# Patient Record
Sex: Male | Born: 1955 | Race: White | Hispanic: No | Marital: Married | Smoking: Former smoker
Health system: Southern US, Community
[De-identification: ages and names within clinical notes are randomized; demographics above are authoritative.]

## PROBLEM LIST (undated history)

## (undated) DIAGNOSIS — N189 Chronic kidney disease, unspecified: Secondary | ICD-10-CM

## (undated) DIAGNOSIS — M1990 Unspecified osteoarthritis, unspecified site: Secondary | ICD-10-CM

## (undated) DIAGNOSIS — E039 Hypothyroidism, unspecified: Secondary | ICD-10-CM

## (undated) DIAGNOSIS — I201 Angina pectoris with documented spasm: Secondary | ICD-10-CM

## (undated) DIAGNOSIS — I10 Essential (primary) hypertension: Secondary | ICD-10-CM

## (undated) DIAGNOSIS — K219 Gastro-esophageal reflux disease without esophagitis: Secondary | ICD-10-CM

## (undated) DIAGNOSIS — I639 Cerebral infarction, unspecified: Secondary | ICD-10-CM

## (undated) DIAGNOSIS — Z720 Tobacco use: Secondary | ICD-10-CM

## (undated) DIAGNOSIS — J189 Pneumonia, unspecified organism: Secondary | ICD-10-CM

## (undated) DIAGNOSIS — I2510 Atherosclerotic heart disease of native coronary artery without angina pectoris: Secondary | ICD-10-CM

## (undated) DIAGNOSIS — E785 Hyperlipidemia, unspecified: Secondary | ICD-10-CM

## (undated) HISTORY — DX: Atherosclerotic heart disease of native coronary artery without angina pectoris: I25.10

## (undated) HISTORY — DX: Unspecified osteoarthritis, unspecified site: M19.90

## (undated) HISTORY — PX: CARDIAC CATHETERIZATION: SHX172

## (undated) HISTORY — PX: BACK SURGERY: SHX140

## (undated) HISTORY — DX: Cerebral infarction, unspecified: I63.9

---

## 1998-11-09 ENCOUNTER — Emergency Department (HOSPITAL_COMMUNITY): Admission: EM | Admit: 1998-11-09 | Discharge: 1998-11-10 | Payer: Self-pay | Admitting: Emergency Medicine

## 1998-11-09 ENCOUNTER — Encounter: Payer: Self-pay | Admitting: Emergency Medicine

## 2001-05-27 ENCOUNTER — Encounter: Payer: Self-pay | Admitting: Emergency Medicine

## 2001-05-27 ENCOUNTER — Emergency Department (HOSPITAL_COMMUNITY): Admission: EM | Admit: 2001-05-27 | Discharge: 2001-05-27 | Payer: Self-pay | Admitting: Emergency Medicine

## 2002-11-24 ENCOUNTER — Encounter: Payer: Self-pay | Admitting: Orthopedic Surgery

## 2002-11-24 ENCOUNTER — Encounter (INDEPENDENT_AMBULATORY_CARE_PROVIDER_SITE_OTHER): Payer: Self-pay | Admitting: *Deleted

## 2002-11-24 ENCOUNTER — Ambulatory Visit (HOSPITAL_COMMUNITY): Admission: RE | Admit: 2002-11-24 | Discharge: 2002-11-24 | Payer: Self-pay | Admitting: Orthopedic Surgery

## 2007-06-11 ENCOUNTER — Inpatient Hospital Stay (HOSPITAL_COMMUNITY): Admission: EM | Admit: 2007-06-11 | Discharge: 2007-06-14 | Payer: Self-pay | Admitting: Emergency Medicine

## 2007-07-19 ENCOUNTER — Observation Stay (HOSPITAL_COMMUNITY): Admission: EM | Admit: 2007-07-19 | Discharge: 2007-07-20 | Payer: Self-pay | Admitting: Emergency Medicine

## 2007-07-27 DIAGNOSIS — I10 Essential (primary) hypertension: Secondary | ICD-10-CM | POA: Insufficient documentation

## 2009-10-08 ENCOUNTER — Inpatient Hospital Stay (HOSPITAL_COMMUNITY): Admission: EM | Admit: 2009-10-08 | Discharge: 2009-10-09 | Payer: Self-pay | Admitting: Emergency Medicine

## 2009-12-12 ENCOUNTER — Ambulatory Visit (HOSPITAL_COMMUNITY): Admission: RE | Admit: 2009-12-12 | Discharge: 2009-12-12 | Payer: Self-pay | Admitting: Family Medicine

## 2010-06-24 ENCOUNTER — Ambulatory Visit (HOSPITAL_COMMUNITY): Admission: RE | Admit: 2010-06-24 | Discharge: 2010-06-24 | Payer: Self-pay | Admitting: General Surgery

## 2010-12-07 LAB — POCT I-STAT, CHEM 8
BUN: 13 mg/dL (ref 6–23)
Calcium, Ion: 1.15 mmol/L (ref 1.12–1.32)
Chloride: 104 meq/L (ref 96–112)
Creatinine, Ser: 0.8 mg/dL (ref 0.4–1.5)
Glucose, Bld: 118 mg/dL — ABNORMAL HIGH (ref 70–99)
HCT: 47 % (ref 39.0–52.0)
Hemoglobin: 16 g/dL (ref 13.0–17.0)
Potassium: 4.1 meq/L (ref 3.5–5.1)
Sodium: 136 meq/L (ref 135–145)
TCO2: 24 mmol/L (ref 0–100)

## 2010-12-07 LAB — CBC
HCT: 41 % (ref 39.0–52.0)
HCT: 44 % (ref 39.0–52.0)
Hemoglobin: 15.3 g/dL (ref 13.0–17.0)
MCHC: 34.9 g/dL (ref 30.0–36.0)
MCV: 89.7 fL (ref 78.0–100.0)
Platelets: 195 K/uL (ref 150–400)
RBC: 4.9 MIL/uL (ref 4.22–5.81)
RDW: 12.9 % (ref 11.5–15.5)
RDW: 13 % (ref 11.5–15.5)
WBC: 7.5 K/uL (ref 4.0–10.5)

## 2010-12-07 LAB — BASIC METABOLIC PANEL WITH GFR
BUN: 11 mg/dL (ref 6–23)
CO2: 28 meq/L (ref 19–32)
Calcium: 9 mg/dL (ref 8.4–10.5)
Chloride: 105 meq/L (ref 96–112)
Creatinine, Ser: 0.92 mg/dL (ref 0.4–1.5)
GFR calc non Af Amer: >60 mL/min
Glucose, Bld: 95 mg/dL (ref 70–99)
Potassium: 4.6 meq/L (ref 3.5–5.1)
Sodium: 137 meq/L (ref 135–145)

## 2010-12-07 LAB — COMPREHENSIVE METABOLIC PANEL
ALT: 47 U/L (ref 0–53)
CO2: 24 mEq/L (ref 19–32)
Chloride: 102 mEq/L (ref 96–112)
GFR calc Af Amer: >60 mL/min (ref >60)
Potassium: 4.5 mEq/L (ref 3.5–5.1)
Sodium: 133 mEq/L — ABNORMAL LOW (ref 135–145)
Total Bilirubin: 0.9 mg/dL (ref 0.3–1.2)

## 2010-12-07 LAB — TSH: TSH: 6.246 u[IU]/mL — ABNORMAL HIGH (ref 0.350–4.500)

## 2010-12-07 LAB — BRAIN NATRIURETIC PEPTIDE: Pro B Natriuretic peptide (BNP): <30 pg/mL (ref 0.0–100.0)

## 2010-12-07 LAB — CARDIAC PANEL(CRET KIN+CKTOT+MB+TROPI)
CK, MB: 1.7 ng/mL (ref 0.3–4.0)
Troponin I: 0.01 ng/mL (ref 0.00–0.06)

## 2010-12-07 LAB — CK TOTAL AND CKMB (NOT AT ARMC)
CK, MB: 2.1 ng/mL (ref 0.3–4.0)
Relative Index: 1.1 (ref 0.0–2.5)

## 2010-12-07 LAB — TROPONIN I

## 2010-12-07 LAB — MAGNESIUM: Magnesium: 2.4 mg/dL (ref 1.5–2.5)

## 2010-12-07 LAB — PROTIME-INR
INR: 0.92 (ref 0.00–1.49)
Prothrombin Time: 12.3 seconds (ref 11.6–15.2)

## 2011-02-03 NOTE — Discharge Summary (Signed)
Carl, [name]                 ACCOUNT NO.:  [id number]   MEDICAL RECORD NO.:  [id number]          PATIENT TYPE:  OBV   LOCATION:  6523                         FACILITY:  [facility]   PHYSICIAN:  Francisca December, M.D.  DATE OF BIRTH:  1955/12/27   DATE OF ADMISSION:  07/19/2007  DATE OF DISCHARGE:  07/20/2007                               DISCHARGE SUMMARY   DISCHARGE DIAGNOSES:  1. Chest pain, felt to be noncardiac in nature.  2. Hypothyroidism.  3. 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  4. History of polysubstance use.  5. Family history of early coronary artery disease.   HOSPITAL COURSE:  [name] is a 55 year old male patient with a one day  history of chest pain, which he describes as being midsternal and  reproducible with palpation.  He denies any shortness of breath,  palpitations.  He states that the pain is not worse with deep breathing.  Review of systems otherwise negative.   PAST MEDICAL HISTORY:  Significant for cardiac catheterization in  September 2008, it showed nonobstructive coronary artery disease, but  did demonstrate coronary spasm of the right coronary artery.  He was  then placed on anti-anginal/vasodilatory drugs (Norvasc, nitroglycerin  patch). The patient has not being taking the patch and this is unclear  to [place] as why he is not taking it.   The patient remained in the hospital overnight and ruled out for  myocardial infarction by negative cardiac isoenzymes.  His TSH was  8.213.  Chest x-ray revealed cardiomegaly versus pulmonary venous  congestion and on one view in the emergency room there is a question of  airspace disease therefore; he was treated for pneumonia, although this  is not completely clear that he has definite fluid pneumonia.   He did undergo a stress Cardiolite and this was normal with no evidence  of infarction or ischemia, normal EF.   He was discharged unstable, but  improved condition on the following  medications.   DISCHARGE MEDICATIONS:  1. Synthroid, this medication was increased to 50 mcg daily and he was      asked to see Dr. Collins Scotland in 6 weeks for recheck of his thyroid      function.  2. He is to start Norvasc 5 mg a day.  3. Nitro-Dur patch 0.4 mg applied in the morning, remove at bedtime.  4. Nexium 40 mg daily.  5. Flexeril 10 mg t.i.d. p.r.n.  6. Vicodin p.r.n.  7. Nitroglycerin p.r.n.  8. Baby aspirin 81 mg daily.   ACTIVITY:  As tolerated.   FOLLOWUP:  Followup with Dr. Deitra Mayo, Nurse Practitioner on  August 04, 2007, at 9:15 a.m.  At this point, we need to make sure  this patient has a followup with Dr. Dewain Penning in order to have his  thyroid reevaluated.   Please note laboratory data during his hospitalization do show negative  cardiac isoenzymes.  Sodium 133, potassium 4.4, BUN 18, creatinine 0.8,  hemoglobin 15.5, hematocrit 44.3.   I did  place him on Zithromax 250 mg one p.o. daily for 3 additional days  because of a question of pneumonia on chest x-ray.      Guy Franco, P.A.      Francisca December, M.D.  Electronically Signed    LB/MEDQ  D:  07/20/2007  T:  07/21/2007  Job:  [id number]

## 2011-02-03 NOTE — Cardiovascular Report (Signed)
NAMENICHOLAD, KAUTZMAN                 ACCOUNT NO.:  [id number]   MEDICAL RECORD NO.:  [id number]          PATIENT TYPE:  INP   LOCATION:  4706                         FACILITY:  [facility]   PHYSICIAN:  Francisca December, M.D.  DATE OF BIRTH:  05-18-1956   DATE OF PROCEDURE:  06/13/2007  DATE OF DISCHARGE:                            CARDIAC CATHETERIZATION   PROCEDURES PERFORMED:  1. Left heart catheterization.  2. Left ventriculogram.  3. Coronary angiography.   INDICATIONS:  Carl [name] is a 55 year old man with multiple risk  factors for coronary heart disease including heavy smoking history.  Been admitted with persistent anterior substernal chest pain unrelieved  with topical nitrates.  Some relief with IV morphine.  Because of the  persistence of his chest discomfort and he has multiple risk factors, he  was brought to the catheterization laboratory at this time to identify  the extent of disease and provide for further therapeutic options.   PROCEDURE NOTE:  The patient is brought to cardiac catheterization  laboratory in the fasting state.  The right groin was prepped and draped  in the usual sterile fashion.  Local anesthesia was obtained with  infiltration of 1% lidocaine.  A 6-French catheter sheath was inserted  percutaneously into the right femoral artery utilizing an anterior  approach over guiding J-wire.  Left heart catheterization and coronary  angiography then proceeded in standard fashion using 6-French #4 left  and right Judkins catheters and a 110-cm pigtail catheter.  A 30-degree  RAO cine left ventriculogram was performed utilizing a power injector  and 45 mL were injected at 13 mL per second.  At completion of the  procedure, a right femoral arteriogram in the 45-degree RAO angulation  via the catheter sheath by hand injection documented adequate anatomy  for placement percutaneous closure device Angio-Seal.  This assessment  deployed with good hemostasis and intact  distal pulse.  The patient is  transported to recovery area in stable condition.  It should be noted  the patient did receive 0.2 mg nitroglycerin into the right coronary  artery following initial right coronary angiography.   HEMODYNAMIC RESULTS:  Systemic arterial pressure was 137/83 with a mean  of 105 mmHg.  There was no systolic gradient across the aortic valve.  The left ventricular end-diastolic pressure was 16 mmHg pre  ventriculogram.   ANGIOGRAPHY:  The left ventriculogram demonstrated normal chamber size  and normal systolic function without regional wall motion abnormality.  Visual estimate of the ejection fraction is 65-70%.  There is no mitral  regurgitation.  There is a minimal degree of right coronary  calcification.   There is a right-dominant coronary system present.  The main left  coronary was short and normal.   The left anterior descending artery and its branches were minimally  diseased; there is a 30%  proximal eccentric stenosis and then luminal  irregularity throughout the remainder of the vessel.  A single large  diagonal arises.  The second diagonal is quite small.  The vessel  reaches and traverses the apex.  There are no obstructions and diagonal  branches.   The left circumflex coronary artery and its branches are without  obstruction.  A large first marginal branch arises and then a large-to-  moderate sized second marginal branch is present.  I can see no  obstructions in the trunk of the left circumflex or in the marginal  branches themselves   The right coronary artery appeared to be a diffusely narrowed,  especially in the posterior descending and posterolateral branches upon  initial angiography.  As well, there was any eccentric 70-75% stenosis  in the proximal to mid segment.  Following intracoronary nitroglycerin  0.2 mg, the proximal stenosis resolved predominantly and the distal  vessels appeared in general much larger.  There is perhaps  a 20%  narrowing left at the previous site of 70% narrowing.  There is a 25%  tubular narrowing in the mid portion and then luminal irregularities are  present in the distal segment.  The distal segment bifurcates into the  poster descending artery and posterolateral segment with a single left  ventricular branch.  There are no obstructions, but there are luminal  irregularities in these distal branches.   Collateral vessels are not seen.   FINAL IMPRESSION:  1. Nonobstructive atherosclerotic cardiovascular disease.  2. Coronary vasospasm involving the right coronary.  3. Intact left ventricular dysfunction size and global systolic      function, ejection fraction 65%.  It should be noted the patient      arrived the catheterization laboratory with mild anterior      substernal chest pain.  At the completion of procedure and after      the intracoronary nitroglycerin, he had no residual discomfort.  It      is certainly possible that coronary vasospasm was playing a role in      his chest discomfort although a 70% stenosis should no be enough to      cause rest pain.  There was, however, distal diffuse spasm.  I      would, therefore, recommend initiation of topical nitroglycerin 0.4      mg in the form of a patch replaced daily at 0800 and remove at      bedtime.  4. Also begin amlodipine 5 mg p.o. daily  5. Baby aspirin daily.  6. Smoking cessation strongly recommended.      Francisca December, M.D.  Electronically Signed     JHE/MEDQ  D:  06/13/2007  T:  06/14/2007  Job:  [id number]   cc:   Tammy R. Collins Scotland, M.D.

## 2011-02-03 NOTE — Discharge Summary (Signed)
Carl [name], Carl [name]                 ACCOUNT NO.:  [id number]   MEDICAL RECORD NO.:  [id number]          PATIENT TYPE:  INP   LOCATION:  4706                         FACILITY:  [facility]   PHYSICIAN:  Ladell Pier, M.D.   DATE OF BIRTH:  May 30, 1956   DATE OF ADMISSION:  06/10/2007  DATE OF DISCHARGE:  06/14/2007                               DISCHARGE SUMMARY   DISCHARGE DIAGNOSES:  1. Chest pain with negative cardiac catheterization, 30% proximal left      anterior descending, circumflex okay, right coronary artery 75%      mild, IC nitroglycerin resolved.  The patient's chest pain      questionable secondary to coronary vasospasm.  The patient      discharged on amlodipine 5 mg daily.  2. Smoking cessation for tobacco abuse.  3. Neck pain/shoulder pain with no cord compression via the MRI.  4. Alcohol use.  5. Subclinical hypothyroidism.  6. Dyslipidemia.   DISCHARGE MEDICATIONS:  1. Norvasc 5 mg daily.  2. Synthroid 25 mcg daily.   FOLLOW UP APPOINTMENTS:  The patient to follow up with Dr. Herb Grays.   CONSULTATIONS:  Cardiology, [place].   PROCEDURE:  Cardiac catheterization done on June 13, 2007.   HISTORY OF PRESENT ILLNESS:  The patient is a 55 year old male that for  the past 3 months he has been having left-sided chest pain on and off,  described as being sharp.  It would last 4 to 5 seconds since June 07, 2007.  However, the pain started occurring more frequently,  sometimes lasting about 10 minutes, and associated with shortness of  breath and diaphoresis.  He has experienced 6 to 7 episodes of this on a  daily basis.   Past medical history, family history, social history, meds, allergies,  review of systems per admission [facility].   PHYSICAL EXAMINATION ON DISCHARGE:  VITAL SIGNS:  Temperature 98.5,  pulse 64, respirations 20, blood pressure 123/85, pulse ox 95% on room  air.  HEENT:  Head is normocephalic, atraumatic.  Pupils equally round and  reactive to light.  Throat regular.  No edema.  CARDIOVASCULAR:  Regular rate and rhythm.  LUNGS:  Clear bilaterally.  ABDOMEN:  Positive bowel sounds.  EXTREMITIES:  No edema.   HOSPITAL COURSE:  1. Chest pain:  The patient was admitted to the hospital and      cardiology was consulted.  He had cardiac enzymes that were normal.      He had a CT angiogram of the chest that showed no pulmonary      embolism.  He had cardiac catheterization that did not show any      significant blockage.  His chest pain is probably secondary to      vasospasm.  He was started on amlodipine, to be discharged with      that.  He had nitro paste while he was inpatient.  2. Subclinical hypothyroidism:  The patient had TSH done that was      elevated to 8, but his free T4 was normal.  He most likely had  subclinical hypothyroidism.  Will start him on Synthroid 25 mcg      daily.  He will follow up with his primary care physician.  3. Neck pain/shoulder pain:  He had a MRI of the neck that did show      some disk bulging, but no cord compression.  Told him to use ice      pack and neck stretches.  4. Smoking, tobacco use:  Encouraged smoking cessation.  5. Dyslipidemia:  The patient had elevated cholesterol in the      hospital.  Will defer to cardiology and primary care physician.      Start on Statin or a period of trial diet and exercise.   DISCHARGE LABS:  Sodium 133, potassium 4, chloride 102, CO2 26, BUN 17,  creatinine 0.83, glucose 80.  WBC 8, hemoglobin 13.6, platelets 191,000.  PTT 38, PT 12.8, INR 0.9.  Free T4 1.08, TSH 8.959.  Total cholesterol  of 268, triglycerides 361, HDL 28, LDL 168.  CK 217, MB 4, relative  index 1.8.  MRI of the C-spine overall mild degenerative changes, mild  neural foraminal stenosis at C5, C6, and C7 levels due to uncovertebral  spurring.      Ladell Pier, M.D.  Electronically Signed     NJ/MEDQ  D:  06/14/2007  T:  06/14/2007  Job:  [id number]   cc:    Tammy R. Collins [place], M.D.

## 2011-02-03 NOTE — H&P (Signed)
NAMEMONTREL, DONAHOE                 ACCOUNT NO.:  [id number]   MEDICAL RECORD NO.:  [id number]          PATIENT TYPE:  OBV   LOCATION:  6523                         FACILITY:  [facility]   PHYSICIAN:  Francisca December, M.D.  DATE OF BIRTH:  1956-08-18   DATE OF ADMISSION:  07/19/2007  DATE OF DISCHARGE:                              HISTORY & PHYSICAL   CHIEF COMPLAINT:  Chest pain.   HISTORY OF PRESENT ILLNESS:  Mr. Peggs is a 55 year old male with a one  day history of chest pain that he describes as just left to his mid  sternal region.  He has reproducible pain with palpation of this area.  The pain is not worse to deep breathing.  He has no shortness of breath  or palpitations, no other symptoms.  Review of systems is otherwise  negative.   PAST MEDICAL HISTORY:  Significant for cardiac catheterization in  September 2008, which showed nonobstructive disease, but did involve  vasospasm of the right coronary artery.  At that point, he was placed on  antianginal/vasodilatory drugs (Norvasc and nitroglycerin patch).  Somehow, the patient is not utilizing the nitroglycerin patch and it is  unclear why.   FAMILY HISTORY:  Coronary artery disease, alcohol use, hypothyroidism.  Mom with a history of coronary artery disease, beginning in her 58s.  Dad beginning of coronary artery disease in his 68s.   SOCIAL HISTORY:  He smokes less than a pack a day.  He drinks on  weekends, approximately 5 beers.  He denies illicit drug use.   ALLERGIES:  No known drug allergies.   MEDICATIONS:  1. Nexium 40 mg a day.  2. Flexeril 10 mg t.i.d. p.r.n.  3. Vicodin p.r.n.  4. Sublingual nitroglycerin p.r.n.  5. Synthroid 25 mcg daily.   PHYSICAL EXAMINATION:  VITAL SIGNS:  Temperature 96, blood pressure  112/75, pulse 57, respirations 12, O2 saturation 99% on room air.  HEENT:  Grossly normal.  Sclera clear.  Conjunctiva normal.  Nares  without drainage.  NECK:  No carotid or subclavian bruits.  No  JVD or thyromegaly.  CHEST:  Clear to auscultation bilaterally.  No wheezing.  No rhonchi.  HEART:  Regular rate and rhythm.  No rubs or murmurs.  ABDOMEN:  Benign.  Good bowel sounds, nontender, nondistended.  No  masses.  No bruits.  LOWER EXTREMITIES:  No peripheral edema.  Skin warm and dry.  Palpable  PT pulses bilaterally.   LABORATORY STUDIES:  EKG shows sinus bradycardia, rate 56, with no acute  changes.   LABORATORY DATA:  Studies show point-of-care markers negative x2.  BUN  18, creatinine 0.8.  CBC normal.   ASSESSMENT/PLAN:  1. Chest pain.  2. Nonobstructive coronary artery disease.  3. History of coronary vasospasm of the right coronary artery.  4. Polysubstance use.  5. Early family history of coronary artery disease.  6. Hypothyroidism.   The patient will be admitted and we will follow cardiac enzymes.  I will  go ahead and place him on Lovenox, as well as IV nitroglycerin.  In  addition, check a  TSH.  He was recently started on thyroid medication  approximately 6 weeks ago.  We plan to perform a stress Cardiolite  tomorrow morning unless enzymes are positive, then we need to go ahead  and proceed with heart catheterization.      Guy Franco, P.A.      Francisca December, M.D.  Electronically Signed    LB/MEDQ  D:  07/19/2007  T:  07/19/2007  Job:  [id number]   cc:   Tammy R. Collins Scotland, M.D.  Francisca December, M.D.

## 2011-02-03 NOTE — Consult Note (Signed)
Carl [name], Carl [name]                 ACCOUNT NO.:  [id number]   MEDICAL RECORD NO.:  [id number]          PATIENT TYPE:  INP   LOCATION:  4706                         FACILITY:  [facility]   PHYSICIAN:  Georga Hacking, M.D.DATE OF BIRTH:  Mar 06, 1956   DATE OF CONSULTATION:  06/11/2007  DATE OF DISCHARGE:                                 CONSULTATION   REASON FOR CONSULTATION:  Chest discomfort.   This 55 year old male requests Dr. Corliss Marcus for his cardiologist,  since Dr. Amil Amen took care of his wife several years ago, and I was  asked to see the patient since I am covering for Dr. Amil Amen this  weekend.   Patient is a 55 year old male who has previously been in reasonable  health but has had significant back pain and is taking an unknown  medication that was given to him by Dr. Collins Scotland.  He was brought to the  emergency room with significant chest discomfort, which is described as  left-sided.  He is somewhat vague in his description of it, but says it  has been present more or less since Tuesday and would come and go.  He  described initially numbness and pain involving his left arm and up into  his neck and also complains of anterior right chest pain as if it is a  burning-type pain.  At times, he says it is pleuritic, and other times  it is not.  It will occasionally hurt with exertion and sometimes occurs  at rest, and describes it in severity of 1-10 as being a 7-10.  His wife  gives a history that the pain has been intermittent for three months  now.  He has had a negative CT angiogram, and his additional point-of-  care enzymes have been unremarkable.  Evidently, he has hyperlipidemia  but is not currently taking any treatment for them.   Last evening, he had gotten off work, and he and his wife were drinking  some beer and being with friends.  He developed some discomfort in his  chest after eating, then the wife noted that he was somewhat  disoriented, complaining of  significant chest discomfort and nausea, and  he was brought to the emergency room.  Point-of-care enzymes were  negative, and a CT angiogram of the chest has been negative for  pulmonary embolus.  He complains of a localized point pain to the left  of his sternal border.   Past history is remarkable for hyperlipidemia.  His low back pain, he  denies hypertension or diabetes.   Previous surgery none.   ALLERGIES:  He evidently has intolerance to ASPIRIN.   FAMILY HISTORY:  He says both his mother and father died in their 6s  and 56s of heart attacks.   SOCIAL HISTORY:  He has been married for 25 years.  He has one son who  is recovering from brain tumor surgery in the ICU.  Another son is  present with his wife.  He smokes one pack of cigarettes per day.  He  drinks a 12-pack of beer on the weekends.  REVIEW OF SYSTEMS:  His weight has been stable.  He has no skin  complaints.  He has no eye, ears, nose, and throat complaints.  He has  significant dyspepsia and indigestion and takes significant medicine for  reflux.  He denies PND, orthopnea, or palpitations.  He has no  genitourinary problems.  He has had low back pain recently.   PHYSICAL EXAMINATION:  He is a well-tanned, bearded male who is  currently lying in bed in no acute distress.  Blood pressure is 112/70, pulse 55.  SKIN:  Warm and dry.  HEENT:  EOMI.  PERRLA. CNS clear.  Fundi not examined.  Pharynx  negative.  NECK:  Supple without masses, JVD, thyromegaly, or bruits.  LUNGS:  Clear.  CARDIAC:  Normal S1 and S2.  No S3.  ABDOMEN:  Soft and nontender.  Distal pulses are 2+.   A 12-lead EKG is reviewed and shows ST depression in III, aVF, and some  T wave flattening in V6.  There is slight ST elevation in V5 and V4 but  is not diagnostic for acute abnormality.   CBC, initial point-of-care enzymes and chemistry panel are normal except  for an elevated glucose of 110.   IMPRESSION:  1. Chest discomfort with  atypical features in a patient with multiple      cardiovascular risk factors.  The pain is not typical for angina      and would be considered atypical.  2. Previous history of hyperlipidemia.  3. Cigarette abuse.  4. History of alcohol abuse on the weekends but evidently not during      the week.   RECOMMENDATIONS:  Patient has atypical chest pain.  He notes an  intolerance to ASPIRIN also.  I would recommend treatment with beta  blockers, anticoagulation, rule out myocardial infarction.  I would also  check an echocardiogram.  Likely with multiple risk factors, we will  need catheterization to evaluate.  I will see the patient over the  weekend, and Dr. Corliss Marcus will assume care on Monday.  Thank you for  asking me to see him with you.      Georga Hacking, M.D.  Electronically Signed     WST/MEDQ  D:  06/11/2007  T:  06/11/2007  Job:  [id number]   cc:   Francisca December, M.D.  Tammy R. Collins Scotland, M.D.

## 2011-02-03 NOTE — H&P (Signed)
NAMEKEENON, LEITZEL                 ACCOUNT NO.:  [id number]   MEDICAL RECORD NO.:  [id number]          PATIENT TYPE:  INP   LOCATION:  4706                         FACILITY:  [facility]   PHYSICIAN:  Isidor Holts, M.D.  DATE OF BIRTH:  1956-05-17   DATE OF ADMISSION:  06/10/2007  DATE OF DISCHARGE:                              HISTORY & PHYSICAL   PRIMARY MEDICAL DOCTOR:  Tammy R. Collins Scotland, M.D.   CHIEF COMPLAINT:  Recurrent chest pain for 3 months, worse over the last  4 days.   HISTORY OF PRESENT ILLNESS:  This is a 55 year old male.  For his past  medical history, see below.  According to the patient, in the past 3  months, he has been  having left-sided chest pain on and off described  as a sharp pain.  However, he has not paid much attention to this, as  it usually lasts about 4 to 5 seconds.  Since June 07, 2007,  however, this chest pain started occurring more frequently, sometimes  lasts up to 10 minutes, is associated with diaphoresis and shortness of  breath, and it now radiates to the left arm, associated with tingling in  the left-hand fingertips.  He has experienced up to 6 to 7 episodes of  this on a daily basis since June 07, 2007. On June 10, 2007  episodes of chest pain occurred and lasted for several hours.  Eventually his son brought him to the emergency department.   PAST MEDICAL HISTORY:  1. Smoker.  2. Alcohol abuse, i.e., binge drinking on weekends.  3. Otherwise nothing else of significance.   MEDICATIONS:  He is not on any regular medications.   ALLERGIES:  The patient experiences GI upset from regular-strength  aspirin.   REVIEW OF SYSTEMS:  As per HPI and chief complaint, otherwise negative.  Denies abdominal pain, vomiting, diarrhea, fever, chills or headache.  He had his last drink on the evening of May 31, 2007, i.e., 4  beers.   SOCIAL HISTORY:  The patient is married and has 2 sons who are both  healthy.  He smoked about 1  packet of cigarettes per day, has done so  for about 30 to 35 years.  He drinks alcohol usually on weekends, i.e.,  Friday and Saturday, about 6 to 10 beers.  As a matter of fact, he had 4  beers on June 10, 2007.  He denies drug abuse.   FAMILY HISTORY:  The patient has 4 brothers and 2 sisters.  However, he  is unaware of their health status.  His mother died at age 59 years  status post MI.  His father died status post an MI in his 41's.   PHYSICAL EXAMINATION:  GENERAL: The patient does not appear to be in  obvious acute distress at the time of this evaluation, although claims  to have continuing chest pain albeit significantly ameliorated following  Aspirin and intravenous Morphine given in the emergency department.  VITAL SIGNS:  Temperature 97.7, pulse 67 per minute and regular,  respiratory rate 18, blood pressure 138/87 mmHg, pulse oximetry  97% on  room air.  HEENT:  No clinical pallor, no conjunctival injection.  Throat is clear.  NECK: Supple.  JVP is not seen. No palpable lymphadenopathy.  No goiter.  No carotid bruits.  CHEST:  Clear to auscultation.  No wheezes, no crackles.  HEART:  Heart sounds 1 and 2 heard, normal and regular, no murmurs.  ABDOMEN:  Full, soft and nontender.  No palpable organomegaly.  No  palpable masses.  Normal bowel sounds.  EXTREMITIES:  On lower extremity examination no pitting edema, palpable  peripheral pulses.  MUSCULOSKELETAL:  Quite unremarkable.  CENTRAL NERVOUS SYSTEM:  No focal neurological deficit on gross  examination.   LABORATORY DATA:  Investigations: CBC:  WBC 11.2, hemoglobin 15.5,  hematocrit 45.1, platelets 236.  Electrolytes:  Sodium 135, potassium  3.7, chloride 102, CO2 of 41, BUN 16, creatinine 0.82, glucose 110,  troponin I of less than 0.05.   Chest x-ray dated June 09, 2007 shows mild cardiomegaly and mild  bronchiectatic changes.  EKG dated  June 10, 2007 shows sinus rhythm regular, 58 per minute,   normal axis, no acute ischemic changes.   ASSESSMENT AND PLAN:  1. Chest pain.  Rule out coronary artery disease.  The patient has      risk factors for coronary artery disease including male gender,      age, smoking history and positive family history.  We shall cycle      cardiac enzymes, continue low-dose Aspirin, utilize Nitro paste and      p.r.n. Morphine.  Check lipid profile and consult cardiology for      risk stratification.  The patient's family has requested that [facility]      Cardiology be consulted, and we shall comply accordingly.   1. Alcohol abuse.  The patient does binge drink.  He shall be      counseled appropriately, and we shall maintain a close watch for      alcohol withdrawal phenomena which should it occur, will be      addressed with p.r.n. Ativan.   1. Smoking history.  The patient has been counseled appropriately.  He      has, however, declined a Nicotine patch.    Further management will depend on the clinical course.      Isidor Holts, M.D.  Electronically Signed     CO/MEDQ  D:  06/11/2007  T:  06/12/2007  Job:  [id number]   cc:   Tammy R. Collins Scotland, M.D.

## 2011-07-01 LAB — I-STAT 8, (EC8 V) (CONVERTED LAB)
Acid-Base Excess: 1
BUN: 18
Bicarbonate: 24.3 — ABNORMAL HIGH
Chloride: 104
Glucose, Bld: 89
HCT: 50
Hemoglobin: 17
Operator id: 294501
Potassium: 4.4
Sodium: 133 — ABNORMAL LOW
TCO2: 25
pCO2, Ven: 34.4 — ABNORMAL LOW
pH, Ven: 7.458 — ABNORMAL HIGH

## 2011-07-01 LAB — CBC
HCT: 44.3
Hemoglobin: 15.5
MCHC: 34.9
MCV: 87
Platelets: 207
Platelets: 221
RBC: 5.09
RDW: 13.1
WBC: 5.3
WBC: 6.8

## 2011-07-01 LAB — COMPREHENSIVE METABOLIC PANEL
Albumin: 3.5
BUN: 14
Chloride: 102
Creatinine, Ser: 0.73
GFR calc non Af Amer: >60
Total Bilirubin: 0.6

## 2011-07-01 LAB — LIPID PANEL
HDL: 31 — ABNORMAL LOW
Total CHOL/HDL Ratio: 5.5
Triglycerides: 86
VLDL: 17

## 2011-07-01 LAB — D-DIMER, QUANTITATIVE: D-Dimer, Quant: 0.32

## 2011-07-01 LAB — POCT CARDIAC MARKERS
CKMB, poc: 1.7
CKMB, poc: 2.9
Myoglobin, poc: 70.7
Myoglobin, poc: 88.9
Operator id: 288331
Operator id: 294501
Troponin i, poc: <0.05
Troponin i, poc: <0.05

## 2011-07-01 LAB — PROTIME-INR
INR: 0.9
Prothrombin Time: 12.4

## 2011-07-01 LAB — DIFFERENTIAL
Basophils Absolute: 0
Basophils Relative: 1
Eosinophils Absolute: 0.1
Eosinophils Relative: 2
Lymphocytes Relative: 26
Lymphs Abs: 1.7
Monocytes Absolute: 0.5
Monocytes Relative: 7
Neutro Abs: 4.4
Neutrophils Relative %: 65

## 2011-07-01 LAB — CK TOTAL AND CKMB (NOT AT ARMC)
Relative Index: 1.3
Relative Index: 1.7
Total CK: 112

## 2011-07-01 LAB — TSH: TSH: 8.213 — ABNORMAL HIGH

## 2011-07-01 LAB — POCT I-STAT CREATININE
Creatinine, Ser: 0.8
Operator id: 294501

## 2011-07-01 LAB — TROPONIN I: Troponin I: <0.01

## 2011-07-02 LAB — DIFFERENTIAL
Basophils Relative: 1
Lymphs Abs: 2.1
Monocytes Relative: 4
Neutro Abs: 8.3 — ABNORMAL HIGH
Neutrophils Relative %: 75

## 2011-07-02 LAB — BASIC METABOLIC PANEL
Calcium: 9.5
Chloride: 102
Chloride: 97
Creatinine, Ser: 0.82
Creatinine, Ser: 0.83
GFR calc Af Amer: >60
GFR calc Af Amer: >60
GFR calc Af Amer: >60
GFR calc non Af Amer: >60
Potassium: 3.9
Potassium: 4

## 2011-07-02 LAB — CARDIAC PANEL(CRET KIN+CKTOT+MB+TROPI)
Total CK: 153
Troponin I: 0.01
Troponin I: <0.01

## 2011-07-02 LAB — CBC
HCT: 41.1
Hemoglobin: 14.3
MCHC: 34.4
MCV: 87.2
MCV: 88.9
Platelets: 236
RBC: 4.49
RBC: 4.72
RBC: 5.13
WBC: 11.1 — ABNORMAL HIGH
WBC: 11.2 — ABNORMAL HIGH
WBC: 8

## 2011-07-02 LAB — COMPREHENSIVE METABOLIC PANEL
AST: 23
CO2: 24
Calcium: 8.8
Creatinine, Ser: 1.01
GFR calc Af Amer: >60
GFR calc non Af Amer: >60
Glucose, Bld: 117 — ABNORMAL HIGH

## 2011-07-02 LAB — URINALYSIS, ROUTINE W REFLEX MICROSCOPIC
Bilirubin Urine: NEGATIVE
Glucose, UA: NEGATIVE
Ketones, ur: NEGATIVE
pH: 5.5

## 2011-07-02 LAB — LIPID PANEL
HDL: 28 — ABNORMAL LOW
HDL: 34 — ABNORMAL LOW
LDL Cholesterol: 194 — ABNORMAL HIGH
Total CHOL/HDL Ratio: 7.8
Triglycerides: 361 — ABNORMAL HIGH
VLDL: 37

## 2011-07-02 LAB — POCT CARDIAC MARKERS
CKMB, poc: 2.7
Myoglobin, poc: 62.5
Myoglobin, poc: 66.6
Operator id: 133351

## 2011-07-02 LAB — APTT: aPTT: 38 — ABNORMAL HIGH

## 2011-07-02 LAB — T4, FREE: Free T4: 1.08

## 2011-07-02 LAB — CK TOTAL AND CKMB (NOT AT ARMC): Relative Index: 1.8

## 2011-12-30 ENCOUNTER — Encounter (HOSPITAL_COMMUNITY): Payer: Self-pay | Admitting: Emergency Medicine

## 2011-12-30 ENCOUNTER — Emergency Department (HOSPITAL_COMMUNITY): Payer: No Typology Code available for payment source

## 2011-12-30 ENCOUNTER — Inpatient Hospital Stay (HOSPITAL_COMMUNITY)
Admission: EM | Admit: 2011-12-30 | Discharge: 2012-01-02 | DRG: 065 | Disposition: A | Discharge status: Home / Self Care | Payer: No Typology Code available for payment source | Attending: Internal Medicine | Admitting: Internal Medicine

## 2011-12-30 DIAGNOSIS — Z7982 Long term (current) use of aspirin: Secondary | ICD-10-CM

## 2011-12-30 DIAGNOSIS — I4892 Unspecified atrial flutter: Secondary | ICD-10-CM | POA: Diagnosis present

## 2011-12-30 DIAGNOSIS — E785 Hyperlipidemia, unspecified: Secondary | ICD-10-CM

## 2011-12-30 DIAGNOSIS — Z79899 Other long term (current) drug therapy: Secondary | ICD-10-CM

## 2011-12-30 DIAGNOSIS — F17200 Nicotine dependence, unspecified, uncomplicated: Secondary | ICD-10-CM | POA: Diagnosis present

## 2011-12-30 DIAGNOSIS — I471 Supraventricular tachycardia, unspecified: Secondary | ICD-10-CM | POA: Diagnosis present

## 2011-12-30 DIAGNOSIS — I10 Essential (primary) hypertension: Secondary | ICD-10-CM

## 2011-12-30 DIAGNOSIS — E782 Mixed hyperlipidemia: Secondary | ICD-10-CM | POA: Insufficient documentation

## 2011-12-30 DIAGNOSIS — K219 Gastro-esophageal reflux disease without esophagitis: Secondary | ICD-10-CM | POA: Diagnosis present

## 2011-12-30 DIAGNOSIS — Z23 Encounter for immunization: Secondary | ICD-10-CM

## 2011-12-30 DIAGNOSIS — Z7902 Long term (current) use of antithrombotics/antiplatelets: Secondary | ICD-10-CM

## 2011-12-30 DIAGNOSIS — Z6825 Body mass index (BMI) 25.0-25.9, adult: Secondary | ICD-10-CM

## 2011-12-30 DIAGNOSIS — E039 Hypothyroidism, unspecified: Secondary | ICD-10-CM

## 2011-12-30 DIAGNOSIS — R001 Bradycardia, unspecified: Secondary | ICD-10-CM

## 2011-12-30 DIAGNOSIS — I495 Sick sinus syndrome: Secondary | ICD-10-CM | POA: Diagnosis present

## 2011-12-30 DIAGNOSIS — I6350 Cerebral infarction due to unspecified occlusion or stenosis of unspecified cerebral artery: Principal | ICD-10-CM | POA: Diagnosis present

## 2011-12-30 DIAGNOSIS — I4891 Unspecified atrial fibrillation: Secondary | ICD-10-CM | POA: Diagnosis present

## 2011-12-30 DIAGNOSIS — I639 Cerebral infarction, unspecified: Secondary | ICD-10-CM

## 2011-12-30 HISTORY — DX: Hyperlipidemia, unspecified: E78.5

## 2011-12-30 HISTORY — DX: Essential (primary) hypertension: I10

## 2011-12-30 HISTORY — DX: Cerebral infarction, unspecified: I63.9

## 2011-12-30 HISTORY — DX: Hypothyroidism, unspecified: E03.9

## 2011-12-30 LAB — POCT I-STAT 3, ART BLOOD GAS (G3+)
Acid-base deficit: 2 mmol/L (ref 0.0–2.0)
Patient temperature: 98.3

## 2011-12-30 LAB — DIFFERENTIAL
Basophils Relative: 1 % (ref 0–1)
Lymphocytes Relative: 26 % (ref 12–46)
Lymphs Abs: 2.3 10*3/uL (ref 0.7–4.0)
Monocytes Absolute: 0.8 10*3/uL (ref 0.1–1.0)
Monocytes Relative: 9 % (ref 3–12)
Neutro Abs: 5.7 10*3/uL (ref 1.7–7.7)

## 2011-12-30 LAB — COMPREHENSIVE METABOLIC PANEL
Alkaline Phosphatase: 58 U/L (ref 39–117)
BUN: 18 mg/dL (ref 6–23)
CO2: 23 mEq/L (ref 19–32)
Chloride: 104 mEq/L (ref 96–112)
Creatinine, Ser: 0.88 mg/dL (ref 0.50–1.35)
GFR calc Af Amer: >90 mL/min (ref >90)
GFR calc non Af Amer: >90 mL/min (ref >90)
Glucose, Bld: 80 mg/dL (ref 70–99)
Potassium: 3.9 mEq/L (ref 3.5–5.1)
Total Bilirubin: 0.3 mg/dL (ref 0.3–1.2)

## 2011-12-30 LAB — PROTIME-INR
INR: 0.94 (ref 0.00–1.49)
Prothrombin Time: 12.8 seconds (ref 11.6–15.2)

## 2011-12-30 LAB — GLUCOSE, CAPILLARY: Glucose-Capillary: 81 mg/dL (ref 70–99)

## 2011-12-30 LAB — CBC
HCT: 47.6 % (ref 39.0–52.0)
Hemoglobin: 16 g/dL (ref 13.0–17.0)
MCHC: 33.6 g/dL (ref 30.0–36.0)
RBC: 5.14 MIL/uL (ref 4.22–5.81)

## 2011-12-30 MED ORDER — SODIUM CHLORIDE 0.9 % IV SOLN
INTRAVENOUS | Status: DC
  Administered 2011-12-31 – 2012-01-01 (×3): via INTRAVENOUS

## 2011-12-30 MED ORDER — SODIUM CHLORIDE 0.9 % IV SOLN
Freq: Once | INTRAVENOUS | Status: AC
  Administered 2011-12-30: 125 mL/h via INTRAVENOUS

## 2011-12-30 MED ORDER — HYDROCODONE-ACETAMINOPHEN 5-325 MG PO TABS
2.0000 | ORAL_TABLET | Freq: Once | ORAL | Status: AC
  Administered 2011-12-30: 2 via ORAL
  Filled 2011-12-30: qty 2

## 2011-12-30 MED ORDER — ASPIRIN 81 MG PO CHEW
324.0000 mg | CHEWABLE_TABLET | Freq: Once | ORAL | Status: AC
  Administered 2011-12-30: 324 mg via ORAL
  Filled 2011-12-30: qty 4

## 2011-12-30 NOTE — ED Provider Notes (Signed)
History     CSN: [id number]  Arrival date & time 12/30/11  1702   First MD Initiated Contact with Patient 12/30/11 1914      Chief Complaint  Patient presents with  . Weakness    (Consider location/radiation/quality/duration/timing/severity/associated sxs/prior treatment) HPI Comments: 56 yo male with new left sided weakness when he woke up at 5 AM this morning (over 12 hours ago upon arrival to ED). Trouble walking due to the weakness. No headaches, blurry vision, vomiting, dizziness or lightheadedness. No injuries or fall. Not on anticoagulation. No change in symptoms since onset.   Patient is a 56 y.o. male presenting with neurologic complaint. The history is provided by the patient.  Neurologic Problem The primary symptoms include focal weakness. Primary symptoms do not include headaches, loss of consciousness, paresthesias, loss of sensation, fever, nausea or vomiting. The symptoms began 12 to 24 hours ago. The symptoms are unchanged. The neurological symptoms are focal.  Weakness began 12 - 24 hours ago. The weakness is unchanged. Region/motion of weakness: left arm and left leg.  Additional symptoms include weakness. Medical issues also include hypertension. Medical issues do not include cerebral vascular accident.    Past Medical History  Diagnosis Date  . Hypertension   . Hyperlipemia   . Hypothyroid     History reviewed. No pertinent past surgical history.  No family history on file.  History  Substance Use Topics  . Smoking status: Not on file  . Smokeless tobacco: Not on file  . Alcohol Use:       Review of Systems  Constitutional: Negative for fever and chills.  HENT: Negative for congestion and rhinorrhea.   Respiratory: Negative for cough and shortness of breath.   Cardiovascular: Negative for chest pain and leg swelling.  Gastrointestinal: Negative for nausea, vomiting, abdominal pain, constipation and blood in stool.  Genitourinary: Negative for  dysuria and decreased urine volume.  Musculoskeletal: Positive for gait problem.  Neurological: Positive for focal weakness and weakness. Negative for loss of consciousness, numbness, headaches and paresthesias.  Psychiatric/Behavioral: Negative for confusion.  All other systems reviewed and are negative.    Allergies  Review of patient's allergies indicates no known allergies.  Home Medications   Current Outpatient Rx  Name Route Sig Dispense Refill  . AMLODIPINE BESYLATE 5 MG PO TABS Oral Take 5 mg by mouth daily.    Marland Kitchen ESOMEPRAZOLE MAGNESIUM 40 MG PO CPDR Oral Take 40 mg by mouth daily before breakfast.    . HYDROCODONE-ACETAMINOPHEN 7.5-500 MG PO TABS Oral Take 1 tablet by mouth 2 (two) times daily as needed. For pain    . LEVOTHYROXINE SODIUM 75 MCG PO TABS Oral Take 75 mcg by mouth daily.    Marland Kitchen ROSUVASTATIN CALCIUM 40 MG PO TABS Oral Take 40 mg by mouth daily.      BP 146/90  Pulse 69  Temp(Src) 98.3 F (36.8 C) (Oral)  Resp 18  SpO2 99%  Physical Exam  Nursing note and vitals reviewed. Constitutional: He is oriented to person, place, and time. He appears well-developed and well-nourished.  HENT:  Head: Normocephalic and atraumatic.  Right Ear: External ear normal.  Left Ear: External ear normal.  Nose: Nose normal.  Eyes: EOM are normal. Pupils are equal, round, and reactive to light.  Neck: Neck supple.  Cardiovascular: Normal rate, regular rhythm, normal heart sounds and intact distal pulses.   Pulmonary/Chest: Effort normal and breath sounds normal. No respiratory distress. He has no wheezes. He has no rales.  Abdominal: Soft. He exhibits no distension and no mass. There is no tenderness. There is no rebound and no guarding.  Musculoskeletal: He exhibits no edema.  Lymphadenopathy:    He has no cervical adenopathy.  Neurological: He is alert and oriented to person, place, and time. No cranial nerve deficit or sensory deficit. He exhibits normal muscle tone. GCS  eye subscore is 4. GCS verbal subscore is 5. GCS motor subscore is 6.       Significant weakness on strength testing in left upper extremity compared to right.   Skin: Skin is warm and dry.    ED Course  Procedures (including critical care time)   Labs Reviewed  GLUCOSE, CAPILLARY  CBC  DIFFERENTIAL  COMPREHENSIVE METABOLIC PANEL  PROTIME-INR  APTT  POCT I-STAT 3, BLOOD GAS (G3+)   Ct Head Wo Contrast  12/30/2011  *RADIOLOGY REPORT*  Clinical Data: Onset of left extremity weakness  CT HEAD WITHOUT CONTRAST  Technique:  Contiguous axial images were obtained from the base of the skull through the vertex without contrast.  Comparison: None.  Findings: No intracranial hemorrhage.  Question subtle infarct mid to right corona radiata/posteriorly right internal capsule.  No hydrocephalus. No intracranial mass lesion detected on this unenhanced exam.  Mega cisterna magna incidentally noted.  IMPRESSION: Question right hemispheric infarct as noted above.  Called report to Dr. Freida Busman.  The patient is not under Dr. West Carbo care.  Presently searching for responsible clinician to give report [facility] CT technologist)  Original Report Authenticated By: Fuller Canada, M.D.     1. Stroke       MDM  New left sided weakness when he awoke this AM. Not a code stroke or eligible for thrombolytics or intervention because symptoms started 14 hours ago. CT head neg for bleed, but shows possible acute ischemic stroke. Given ASA based on no bleed. Patient otherwise appears well and is asymptomatic. Will admit to triad hospitalist for further stroke workup and admission.          Pricilla Loveless, MD 12/30/11 2150

## 2011-12-30 NOTE — H&P (Signed)
PCP:   Herb Grays, MD, MD   Chief Complaint:  Left lower extremity numbness  HPI: This is a 56 year old gentleman who woke up this morning with the left-sided numbness. This persisted all day. He had some dragging in his left lower extremity. He denies any swallowing difficulty. He states is compliant with medication. He does have a history of hypertension. He denies any altered mentation, headaches, nausea, vomiting, palpitations. He's never had a stroke before. He finally decided to come to the ER at 7 PM. The patient's numbness or weakness persists. History provided by the patient and the family members at bedside . Patient alert and oriented. He did have a swallow study in the ER which he passed.  Review of Systems:positives bolded   anorexia, fever, weight loss,, vision loss, decreased hearing, hoarseness, chest pain, syncope, dyspnea on exertion, peripheral edema, balance deficits, hemoptysis, abdominal pain, melena, hematochezia, severe indigestion/heartburn, hematuria, incontinence, genital sores, muscle weakness, suspicious skin lesions, transient blindness, difficulty walking, depression, unusual weight change, abnormal bleeding, enlarged lymph nodes, angioedema, and breast masses.  Past Medical History: Past Medical History  Diagnosis Date  . Hypertension   . Hyperlipemia   . Hypothyroid    History reviewed. No pertinent past surgical history.  Medications: Prior to Admission medications   Medication Sig Start Date End Date Taking? Authorizing Provider  amLODipine (NORVASC) 5 MG tablet Take 5 mg by mouth daily.   Yes Historical Provider, MD  esomeprazole (NEXIUM) 40 MG capsule Take 40 mg by mouth daily before breakfast.   Yes Historical Provider, MD  HYDROcodone-acetaminophen (LORTAB) 7.5-500 MG per tablet Take 1 tablet by mouth 2 (two) times daily as needed. For pain   Yes Historical Provider, MD  levothyroxine (SYNTHROID, LEVOTHROID) 75 MCG tablet Take 75 mcg by mouth daily.    Yes Historical Provider, MD  rosuvastatin (CRESTOR) 40 MG tablet Take 40 mg by mouth daily.   Yes Historical Provider, MD    Allergies:  No Known Allergies  Social History:  reports that he has been smoking.  He does not have any smokeless tobacco history on file. He reports that he drinks alcohol. He reports that he does not use illicit drugs.  Family History: Family History  Problem Relation Age of Onset  . Coronary artery disease      Physical Exam: Filed Vitals:   12/30/11 1743 12/30/11 1801 12/30/11 2030 12/30/11 2035  BP:  146/90 132/80   Pulse: 69  51   Temp: 98.3 F (36.8 C)   97.9 F (36.6 C)  TempSrc: Oral     Resp: 18  14   SpO2: 99%  98%     General:  Alert and oriented times three, well developed and nourished, no acute distress Eyes: PERRLA, pink conjunctiva, no scleral icterus ENT: Moist oral mucosa, neck supple, no thyromegaly Lungs: clear to ascultation, no wheeze, no crackles, no use of accessory muscles Cardiovascular: regular rate and rhythm, no regurgitation, no gallops, no murmurs. No carotid bruits, no JVD Abdomen: soft, positive BS, non-tender, non-distended, no organomegaly, not an acute abdomen GU: not examined Neuro: CN II - XII grossly intact,  Musculoskeletal: strength 5/5 all extremities except left upper and lower extremities 4.5 out of 5. Grip left upper extremity decreased,sensation decreased left lower extremity, no clubbing, cyanosis or edema Skin: no rash, no subcutaneous crepitation, no decubitus Psych: appropriate patient   Labs on Admission:   [facility] 12/30/11 1850  NA 137  K 3.9  CL 104  CO2 23  GLUCOSE 80  BUN 18  CREATININE 0.88  CALCIUM 9.4  MG --  PHOS --    Basename 12/30/11 1850  AST 37  ALT 31  ALKPHOS 58  BILITOT 0.3  PROT 7.5  ALBUMIN 4.1   No results found for this basename: LIPASE:2,AMYLASE:2 in the last 72 hours  Basename 12/30/11 1850  WBC 9.0  NEUTROABS 5.7  HGB 16.0  HCT 47.6  MCV 92.6    PLT 205   No results found for this basename: CKTOTAL:3,CKMB:3,CKMBINDEX:3,TROPONINI:3 in the last 72 hours No components found with this basename: POCBNP:3 No results found for this basename: DDIMER:2 in the last 72 hours No results found for this basename: HGBA1C:2 in the last 72 hours No results found for this basename: CHOL:2,HDL:2,LDLCALC:2,TRIG:2,CHOLHDL:2,LDLDIRECT:2 in the last 72 hours No results found for this basename: TSH,T4TOTAL,FREET3,T3FREE,THYROIDAB in the last 72 hours No results found for this basename: VITAMINB12:2,FOLATE:2,FERRITIN:2,TIBC:2,IRON:2,RETICCTPCT:2 in the last 72 hours  Micro Results: No results found for this or any previous visit (from the past 240 hour(s)).   Radiological Exams on Admission: Ct Head Wo Contrast  12/30/2011  *RADIOLOGY REPORT*  Clinical Data: Onset of left extremity weakness  CT HEAD WITHOUT CONTRAST  Technique:  Contiguous axial images were obtained from the base of the skull through the vertex without contrast.  Comparison: None.  Findings: No intracranial hemorrhage.  Question subtle infarct mid to right corona radiata/posteriorly right internal capsule.  No hydrocephalus. No intracranial mass lesion detected on this unenhanced exam.  Mega cisterna magna incidentally noted.  IMPRESSION: Question right hemispheric infarct as noted above.  Called report to Dr. Freida Busman.  The patient is not under Dr. West Carbo care.  Presently searching for responsible clinician to give report [facility] CT technologist)  Original Report Authenticated By: Fuller Canada, M.D.    EKG: Normal sinus rhythm  Assessment/Plan Present on Admission:  .CVA (cerebral vascular accident) Admit to neuro floor CVA protocol ordered Tobacco abuse Nicotine patch Tobacco cessation education Hypertension Pressure not significantly elevated Norvasc held,. Blood pressure medications ordered Hypothyroidism Dyslipidemia Resume home medications  Full code DVT  prophylaxis Team 5/Dr. Rogelia Boga, Ledonna Dormer 12/30/2011, 9:46 PM

## 2011-12-30 NOTE — ED Notes (Signed)
Attempted to call report and the secretary stated that the RN was unaware that she was getting at pt and requested that I call back.

## 2011-12-30 NOTE — ED Notes (Signed)
Pt received to RM 8 with c/o lt sided weakness onset when he woke up this morning at 0500. Pt denies any dizziness, no headache, no slurred speech. Pt is A/A/Ox4, skin is warm and dry, respiration is even and unlabored.

## 2011-12-30 NOTE — ED Notes (Signed)
Onset today woke up at 0430 left lower extremity weakness and left upper extremity weakness. States left upper extremity achy. Ax4

## 2011-12-30 NOTE — ED Provider Notes (Signed)
I saw and evaluated the patient, reviewed the resident's note and I agree with the findings and plan.   Gwyneth Sprout, MD 12/30/11 2342

## 2011-12-30 NOTE — ED Notes (Signed)
Called flow Production designer, theatre/television/film.  She stated they are having a difficult time finding a neuro/tele bed, but she is looking into it.

## 2011-12-31 ENCOUNTER — Inpatient Hospital Stay (HOSPITAL_COMMUNITY): Payer: No Typology Code available for payment source

## 2011-12-31 ENCOUNTER — Encounter (HOSPITAL_COMMUNITY): Payer: Self-pay

## 2011-12-31 LAB — LIPID PANEL
Cholesterol: 154 mg/dL (ref 0–200)
Triglycerides: 126 mg/dL (ref <150)
VLDL: 25 mg/dL (ref 0–40)

## 2011-12-31 LAB — CREATININE, SERUM: Creatinine, Ser: 0.79 mg/dL (ref 0.50–1.35)

## 2011-12-31 LAB — T3: T3, Total: 78.3 ng/dl — ABNORMAL LOW (ref 80.0–204.0)

## 2011-12-31 LAB — CBC
Hemoglobin: 14.5 g/dL (ref 13.0–17.0)
MCH: 30.5 pg (ref 26.0–34.0)
MCV: 92.2 fL (ref 78.0–100.0)
RBC: 4.75 MIL/uL (ref 4.22–5.81)

## 2011-12-31 MED ORDER — NICOTINE 14 MG/24HR TD PT24
14.0000 mg | MEDICATED_PATCH | Freq: Every day | TRANSDERMAL | Status: DC
  Filled 2011-12-31 (×3): qty 1

## 2011-12-31 MED ORDER — ACETAMINOPHEN 325 MG PO TABS
650.0000 mg | ORAL_TABLET | Freq: Once | ORAL | Status: AC
  Administered 2011-12-31: 650 mg via ORAL
  Filled 2011-12-31: qty 2

## 2011-12-31 MED ORDER — HYDROCODONE-ACETAMINOPHEN 5-325 MG PO TABS
1.0000 | ORAL_TABLET | ORAL | Status: DC | PRN
  Administered 2011-12-31 – 2012-01-02 (×6): 2 via ORAL
  Filled 2011-12-31 (×6): qty 2

## 2011-12-31 MED ORDER — ASPIRIN 325 MG PO TABS
325.0000 mg | ORAL_TABLET | Freq: Every day | ORAL | Status: DC
  Administered 2011-12-31: 325 mg via ORAL
  Filled 2011-12-31: qty 1

## 2011-12-31 MED ORDER — ENOXAPARIN SODIUM 40 MG/0.4ML ~~LOC~~ SOLN
40.0000 mg | SUBCUTANEOUS | Status: DC
  Administered 2011-12-31 – 2012-01-01 (×2): 40 mg via SUBCUTANEOUS
  Filled 2011-12-31 (×3): qty 0.4

## 2011-12-31 MED ORDER — ATORVASTATIN CALCIUM 20 MG PO TABS
20.0000 mg | ORAL_TABLET | Freq: Every day | ORAL | Status: DC
  Administered 2011-12-31 – 2012-01-01 (×2): 20 mg via ORAL
  Filled 2011-12-31 (×3): qty 1

## 2011-12-31 MED ORDER — CLOPIDOGREL BISULFATE 75 MG PO TABS: 75.0000 mg | ORAL_TABLET | Freq: Every day | ORAL | Status: DC

## 2011-12-31 MED ORDER — LEVOTHYROXINE SODIUM 75 MCG PO TABS
75.0000 ug | ORAL_TABLET | Freq: Every day | ORAL | Status: DC
  Administered 2011-12-31 – 2012-01-02 (×3): 75 ug via ORAL
  Filled 2011-12-31 (×3): qty 1

## 2011-12-31 MED ORDER — PANTOPRAZOLE SODIUM 40 MG PO TBEC
40.0000 mg | DELAYED_RELEASE_TABLET | Freq: Every day | ORAL | Status: DC
  Administered 2011-12-31 – 2012-01-02 (×3): 40 mg via ORAL
  Filled 2011-12-31 (×3): qty 1

## 2011-12-31 MED ORDER — CLONIDINE HCL 0.1 MG PO TABS
0.1000 mg | ORAL_TABLET | Freq: Four times a day (QID) | ORAL | Status: DC | PRN
  Filled 2011-12-31: qty 1

## 2011-12-31 MED ORDER — SENNOSIDES-DOCUSATE SODIUM 8.6-50 MG PO TABS: 1.0000 | ORAL_TABLET | Freq: Every evening | ORAL | Status: DC | PRN

## 2011-12-31 MED ORDER — CLOPIDOGREL BISULFATE 75 MG PO TABS
75.0000 mg | ORAL_TABLET | Freq: Every day | ORAL | Status: DC
  Administered 2012-01-01 – 2012-01-02 (×2): 75 mg via ORAL
  Filled 2011-12-31 (×3): qty 1

## 2011-12-31 MED ORDER — ASPIRIN 300 MG RE SUPP
300.0000 mg | Freq: Every day | RECTAL | Status: DC
  Filled 2011-12-31: qty 1

## 2011-12-31 MED ORDER — PNEUMOCOCCAL VAC POLYVALENT 25 MCG/0.5ML IJ INJ
0.5000 mL | INJECTION | INTRAMUSCULAR | Status: AC
  Administered 2011-12-31: 0.5 mL via INTRAMUSCULAR
  Filled 2011-12-31: qty 0.5

## 2011-12-31 MED ORDER — ASPIRIN 325 MG PO TABS: 325.0000 mg | ORAL_TABLET | Freq: Every day | ORAL | Status: DC

## 2011-12-31 NOTE — Consult Note (Signed)
[facility] CONSULT NOTE     Reason for Consult: stroke    HPI:    Carl [name] is an 56 y.o. male  gentleman who woke up  with the left-sided numbness. This persisted all day. He finally decided to come to the ER at 7 PM and was dragging in his left lower extremity. He denies any swallowing difficulty. He states is compliant with medication. He does have a history of hypertension. He denies any altered mentation, headaches, nausea, vomiting, palpitations. He continues to have left hand, tricep, and shoulder abduction weakness. He states he has been on ASA 81 mg daily. 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  Past Medical History  Diagnosis Date  . Hypertension   . Hyperlipemia   . Hypothyroid     History reviewed. No pertinent past surgical history.  Family History  Problem Relation Age of Onset  . Coronary artery disease      Social History:  reports that he has been smoking.  He does not have any smokeless tobacco history on file. He reports that he drinks alcohol. He reports that he does not use illicit drugs.  No Known Allergies  Medications:    Prior to Admission:  Prescriptions prior to admission  Medication Sig Dispense Refill  . amLODipine (NORVASC) 5 MG tablet Take 5 mg by mouth daily.      Marland Kitchen esomeprazole (NEXIUM) 40 MG capsule Take 40 mg by mouth daily before breakfast.      . HYDROcodone-acetaminophen (LORTAB) 7.5-500 MG per tablet Take 1 tablet by mouth 2 (two) times daily as needed. For pain      . levothyroxine (SYNTHROID, LEVOTHROID) 75 MCG tablet Take 75 mcg by mouth daily.      . rosuvastatin (CRESTOR) 40 MG tablet Take 40 mg by mouth daily.       Scheduled:   . sodium chloride   Intravenous Once  . acetaminophen  650 mg Oral Once  . aspirin  324 mg Oral Once  . atorvastatin  20 mg Oral q1800  . clopidogrel  75  mg Oral Q breakfast  . enoxaparin  40 mg Subcutaneous Q24H  . HYDROcodone-acetaminophen  2 tablet Oral Once  . levothyroxine  75 mcg Oral Daily  . nicotine  14 mg Transdermal Daily  . pantoprazole  40 mg Oral Daily  . pneumococcal 23 valent vaccine  0.5 mL Intramuscular Tomorrow-1000  . DISCONTD: aspirin  300 mg Rectal Daily  . DISCONTD: aspirin  325 mg Oral Daily    Review of Systems - General ROS: negative for - chills, fatigue, fever or hot flashes Hematological and Lymphatic ROS: negative for - bruising, fatigue, jaundice or pallor Endocrine ROS: negative for - hair pattern changes, hot flashes, mood swings or skin changes Respiratory ROS: negative for - cough, hemoptysis, orthopnea or wheezing Cardiovascular ROS: negative for - dyspnea on exertion, orthopnea, palpitations or shortness of breath Gastrointestinal ROS: negative for - abdominal pain, appetite loss, blood in stools, diarrhea or hematemesis Musculoskeletal ROS: negative for - joint pain, joint stiffness, joint swelling or muscle pain Neurological ROS: See HPI Dermatological ROS: negative for dry skin, pruritus and rash   Blood pressure 144/87, pulse 44, temperature 98 F (36.7 C), temperature source Oral, resp. rate 18, height 5\' 9"  (  1.753 m), weight 79.289 kg (174 lb 12.8 oz), SpO2 98.00%.   Neurologic Examination:   Mental Status: Alert, oriented, thought content appropriate.  Speech fluent without evidence of aphasia. Able to follow 3 step commands without difficulty. Cranial Nerves: II-Visual fields grossly intact. III/IV/VI-Extraocular movements intact.  Pupils reactive bilaterally. V/VII-Smile symmetric VIII-grossly intact IX/X-normal gag XI-bilateral shoulder shrug XII-midline tongue extension Motor: weakness in left shoulder abduction, triceps extension, wrist extension and grip.  Mild weakness in left hip flexors and knee flexion.  No weakness noted with left bicep flexion or knee extension.  Sensory:  Pinprick and light touch intact throughout, bilaterally Deep Tendon Reflexes: 2+ and symmetric throughout Plantars: Downgoing bilaterally   Lab Results  Component Value Date/Time   CHOL 154 12/31/2011  7:05 AM    Results for orders placed during the hospital encounter of 12/30/11 (from the past 48 hour(s))  GLUCOSE, CAPILLARY     Status: Normal   Collection Time   12/30/11  6:16 PM      Component Value Range Comment   Glucose-Capillary 81  70 - 99 (mg/dL)   CBC     Status: Normal   Collection Time   12/30/11  6:50 PM      Component Value Range Comment   WBC 9.0  4.0 - 10.5 (K/uL)    RBC 5.14  4.22 - 5.81 (MIL/uL)    Hemoglobin 16.0  13.0 - 17.0 (g/dL)    HCT 16.1  09.6 - 04.5 (%)    MCV 92.6  78.0 - 100.0 (fL)    MCH 31.1  26.0 - 34.0 (pg)    MCHC 33.6  30.0 - 36.0 (g/dL)    RDW 40.9  81.1 - 91.4 (%)    Platelets 205  150 - 400 (K/uL)   DIFFERENTIAL     Status: Normal   Collection Time   12/30/11  6:50 PM      Component Value Range Comment   Neutrophils Relative 63  43 - 77 (%)    Neutro Abs 5.7  1.7 - 7.7 (K/uL)    Lymphocytes Relative 26  12 - 46 (%)    Lymphs Abs 2.3  0.7 - 4.0 (K/uL)    Monocytes Relative 9  3 - 12 (%)    Monocytes Absolute 0.8  0.1 - 1.0 (K/uL)    Eosinophils Relative 1  0 - 5 (%)    Eosinophils Absolute 0.1  0.0 - 0.7 (K/uL)    Basophils Relative 1  0 - 1 (%)    Basophils Absolute 0.1  0.0 - 0.1 (K/uL)   COMPREHENSIVE METABOLIC PANEL     Status: Normal   Collection Time   12/30/11  6:50 PM      Component Value Range Comment   Sodium 137  135 - 145 (mEq/L)    Potassium 3.9  3.5 - 5.1 (mEq/L)    Chloride 104  96 - 112 (mEq/L)    CO2 23  19 - 32 (mEq/L)    Glucose, Bld 80  70 - 99 (mg/dL)    BUN 18  6 - 23 (mg/dL)    Creatinine, Ser 7.82  0.50 - 1.35 (mg/dL)    Calcium 9.4  8.4 - 10.5 (mg/dL)    Total Protein 7.5  6.0 - 8.3 (g/dL)    Albumin 4.1  3.5 - 5.2 (g/dL)    AST 37  0 - 37 (U/L)    ALT 31  0 - 53 (U/L)    Alkaline Phosphatase 58  39 -  117 (U/L)    Total Bilirubin 0.3  0.3 - 1.2 (mg/dL)    GFR calc non Af Amer >90  >90 (mL/min)    GFR calc Af Amer >90  >90 (mL/min)   PROTIME-INR     Status: Normal   Collection Time   12/30/11  6:50 PM      Component Value Range Comment   Prothrombin Time 12.8  11.6 - 15.2 (seconds)    INR 0.94  0.00 - 1.49    APTT     Status: Normal   Collection Time   12/30/11  6:50 PM      Component Value Range Comment   aPTT 35  24 - 37 (seconds)   POCT I-STAT 3, BLOOD GAS (G3+)     Status: Normal   Collection Time   12/30/11  7:42 PM      Component Value Range Comment   pH, Arterial 7.409  7.350 - 7.450     pCO2 arterial 35.7  35.0 - 45.0 (mmHg)    pO2, Arterial 82.0  80.0 - 100.0 (mmHg)    Bicarbonate 22.6  20.0 - 24.0 (mEq/L)    TCO2 24  0 - 100 (mmol/L)    O2 Saturation 96.0      Acid-base deficit 2.0  0.0 - 2.0 (mmol/L)    Patient temperature 98.3 F      Collection site RADIAL, ALLEN'S TEST ACCEPTABLE      Drawn by RT      Sample type ARTERIAL     CBC     Status: Normal   Collection Time   12/31/11  7:05 AM      Component Value Range Comment   WBC 5.4  4.0 - 10.5 (K/uL)    RBC 4.75  4.22 - 5.81 (MIL/uL)    Hemoglobin 14.5  13.0 - 17.0 (g/dL)    HCT 16.1  09.6 - 04.5 (%)    MCV 92.2  78.0 - 100.0 (fL)    MCH 30.5  26.0 - 34.0 (pg)    MCHC 33.1  30.0 - 36.0 (g/dL)    RDW 40.9  81.1 - 91.4 (%)    Platelets 177  150 - 400 (K/uL)   CREATININE, SERUM     Status: Normal   Collection Time   12/31/11  7:05 AM      Component Value Range Comment   Creatinine, Ser 0.79  0.50 - 1.35 (mg/dL)    GFR calc non Af Amer >90  >90 (mL/min)    GFR calc Af Amer >90  >90 (mL/min)   LIPID PANEL     Status: Normal   Collection Time   12/31/11  7:05 AM      Component Value Range Comment   Cholesterol 154  0 - 200 (mg/dL)    Triglycerides 782  <150 (mg/dL)    HDL 71  >95 (mg/dL)    Total CHOL/HDL Ratio 2.2      VLDL 25  0 - 40 (mg/dL)    LDL Cholesterol 58  0 - 99 (mg/dL)     Dg Chest 2  View  12/30/2011  *RADIOLOGY REPORT*  Clinical Data: Numbness left-sided body.  Smoker.  CHEST - 2 VIEW  Comparison: 10/08/2009.  Findings: Chronic bronchitis type changes with peribronchial thickening.  Calcified mildly tortuous aorta.  No infiltrate, congestive heart failure or pneumothorax.  Prominent sclerosis anterior aspect first rib unchanged.  IMPRESSION: Calcified mildly tortuous aorta.  Peribronchial thickening suggestive of chronic bronchitis  type changes unchanged.  Original Report Authenticated By: Fuller Canada, M.D.   Ct Head Wo Contrast  12/30/2011  *RADIOLOGY REPORT*  Clinical Data: Onset of left extremity weakness  CT HEAD WITHOUT CONTRAST  Technique:  Contiguous axial images were obtained from the base of the skull through the vertex without contrast.  Comparison: None.  Findings: No intracranial hemorrhage.  Question subtle infarct mid to right corona radiata/posteriorly right internal capsule.  No hydrocephalus. No intracranial mass lesion detected on this unenhanced exam.  Mega cisterna magna incidentally noted.  IMPRESSION: Question right hemispheric infarct as noted above.  Called report to Dr. Freida Busman.  The patient is not under Dr. West Carbo care.  Presently searching for responsible clinician to give report [facility] CT technologist)  Original Report Authenticated By: Fuller Canada, M.D.   Mr Brain Wo Contrast  12/31/2011  *RADIOLOGY REPORT*  Clinical Data:  Stroke.  New onset of left-sided weakness.  MRI HEAD WITHOUT CONTRAST MRA HEAD WITHOUT CONTRAST  Technique:  Multiplanar, multiecho pulse sequences of the brain and surrounding structures were obtained without intravenous contrast. Angiographic images of the head were obtained using MRA technique without contrast.  Comparison:  CT head without contrast 12/30/2011.  MRI HEAD  Findings:  A focal area of restricted diffusion is evident in the posterior right corona radiata with some signal extending into the posterior limb of the  right internal capsule.  T2 and FLAIR hyperintensities associated with this lesion, compatible with the given time frame. No other significant white matter disease is present.  There are remote infarcts.  No hemorrhage or mass lesion is evident.  Flow is present in the major intracranial arteries.  The globes and orbits are intact.  Mild mucosal thickening is present throughout the ethmoid air cells.  The maxillary sinuses are shrunken bilaterally.  Frontal sinuses and sphenoid sinuses are clear.  The mastoid air cells are clear.  IMPRESSION:  1.  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 2.  No other remote infarcts or significant white matter disease. 3.  Chronic ethmoid and maxillary sinus disease.  MRA HEAD  Findings: The internal carotid arteries are within normal limits bilaterally.  The A1 and M1 segments are normal.  No anterior communicating artery is evident.  There is some attenuation of distal MCA branch vessels.  No significant proximal stenosis or occlusion is present.  There is some attenuation of distal ACA branch vessels.  The right vertebral artery is slightly dominant to the left.  The PICA origins are visualized bilaterally.  The basilar artery is within normal limits.  Both posterior cerebral arteries originate from the basilar tip.  There is some attenuation of distal PCA branch vessels.  The study is mildly degraded by patient motion.  IMPRESSION:  1.  Mild distal small vessel disease. 2.  No other significant proximal stenosis, aneurysm, or branch vessel occlusion.  Original Report Authenticated By: Jamesetta Orleans. MATTERN, M.D.   Mr Maxine Glenn Head/brain Wo Cm  12/31/2011  *RADIOLOGY REPORT*  Clinical Data:  Stroke.  New onset of left-sided weakness.  MRI HEAD WITHOUT CONTRAST MRA HEAD WITHOUT CONTRAST  Technique:  Multiplanar, multiecho pulse sequences of the brain and surrounding structures were  obtained without intravenous contrast. Angiographic images of the head were obtained using MRA technique without contrast.  Comparison:  CT head without contrast 12/30/2011.  MRI HEAD  Findings:  A focal area of restricted diffusion is evident  in the posterior right corona radiata with some signal extending into the posterior limb of the right internal capsule.  T2 and FLAIR hyperintensities associated with this lesion, compatible with the given time frame. No other significant white matter disease is present.  There are remote infarcts.  No hemorrhage or mass lesion is evident.  Flow is present in the major intracranial arteries.  The globes and orbits are intact.  Mild mucosal thickening is present throughout the ethmoid air cells.  The maxillary sinuses are shrunken bilaterally.  Frontal sinuses and sphenoid sinuses are clear.  The mastoid air cells are clear.  IMPRESSION:  1.  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 2.  No other remote infarcts or significant white matter disease. 3.  Chronic ethmoid and maxillary sinus disease.  MRA HEAD  Findings: The internal carotid arteries are within normal limits bilaterally.  The A1 and M1 segments are normal.  No anterior communicating artery is evident.  There is some attenuation of distal MCA branch vessels.  No significant proximal stenosis or occlusion is present.  There is some attenuation of distal ACA branch vessels.  The right vertebral artery is slightly dominant to the left.  The PICA origins are visualized bilaterally.  The basilar artery is within normal limits.  Both posterior cerebral arteries originate from the basilar tip.  There is some attenuation of distal PCA branch vessels.  The study is mildly degraded by patient motion.  IMPRESSION:  1.  Mild distal small vessel disease. 2.  No other significant proximal stenosis, aneurysm, or branch vessel  occlusion.  Original Report Authenticated By: Jamesetta Orleans. MATTERN, M.D.     Assessment/Plan:   56 YO male who suffered from a right right corona radiata and extending in the posterior limb of the right internal capsule. Patient was previously not on ASA per home records.   Stroke Risk Factors - hyperlipidemia and hypertension  Plan: 1. HgbA1c, fasting lipid panel 2. MRI, MRA  of the brain without contrast 3. PT consult, OT consult, Speech consult 4. Echocardiogram 5. Carotid dopplers 6. Prophylactic therapy-Antiplatelet ZOX:WRUEAV 75 mg daily 7. Fall precautions.  8. Risk  Factor modification 9. Cardiac Monitoring   Felicie Morn PA-C [facility] [phone]  12/31/2011, 12:13 PM

## 2011-12-31 NOTE — Evaluation (Signed)
Speech Language Pathology Evaluation Patient Details Name: Carl [name] MRN: [id number] DOB: 05-21-1956 Today's Date: 12/31/2011  Problem List:  Patient Active Problem List  Diagnoses  . CVA (cerebral vascular accident)  . Hypertension  . Dyslipidemia  . Hypothyroidism   Past Medical History:  Past Medical History  Diagnosis Date  . Hypertension   . Hyperlipemia   . Hypothyroid    Past Surgical History: History reviewed. No pertinent past surgical history.  SLP Assessment/Plan/Recommendation  Clinical Impression: 56 y.o. male admitted after experiencing left-sided numbness/weakness.  MRI revealed  acute non hemorrhagic infarct involving the posterior right corona radiata and extending in the posterior limb of the right internal capsule.  Pt's cognitive-linguistic function appears to be WNL.  No speech nor swallowing deficits.  No SLP f/u required - SLP to sign-off.  SLP Recommendation/Assessment: Patient does not need any further Speech Language Pathology Services No Skilled Speech Therapy: Patient at baseline level of functioning SLP Recommendations Follow up Recommendations: None Individuals Consulted Consulted and Agree with Results and Recommendations: Patient  SLP Goals   n/a  Carl [name], [place] CCC/SLP Pager [phone]  Carl [name] 12/31/2011, 12:49 PM

## 2011-12-31 NOTE — Progress Notes (Signed)
*  PRELIMINARY RESULTS* Vascular Ultrasound Carotid Duplex (Doppler) has been completed. No evidence of internal carotid artery stenosis bilaterally. Antegrade bilateral vertebral artery flow.  Malachy Moan, RDMS, RDCS 12/31/2011, 5:22 PM

## 2011-12-31 NOTE — Evaluation (Signed)
Physical Therapy Evaluation Patient Details Name: Carl [name] MRN: [id number] DOB: [birth year] Today's Date: 12/31/2011  Problem List:  Patient Active Problem List  Diagnoses  . CVA (cerebral vascular accident)  . Hypertension  . Dyslipidemia  . Hypothyroidism    Past Medical History:  Past Medical History  Diagnosis Date  . Hypertension   . Hyperlipemia   . Hypothyroid    Past Surgical History: History reviewed. No pertinent past surgical history.  PT Assessment/Plan/Recommendation PT Assessment Clinical Impression Statement: Pt is a 56 y.o. male s/p R CVA with resulting left calf pain, LLE weakness, decreased mobility, impaired balance, and impaired ambulation.  Pt will benefit from skilled physical therapy in the acute setting in order to address these impairments and prepare for d/c home. PT Recommendation/Assessment: Patient will need skilled PT in the acute care venue PT Problem List: Decreased strength;Decreased activity tolerance;Decreased balance;Decreased mobility;Impaired sensation;Pain Barriers to Discharge: None PT Therapy Diagnosis : Difficulty walking;Abnormality of gait;Hemiplegia non-dominant side PT Plan PT Frequency: Min 4X/week PT Treatment/Interventions: DME instruction;Gait training;Stair training;Functional mobility training;Therapeutic exercise;Balance training;Neuromuscular re-education;Patient/family education PT Recommendation Follow Up Recommendations: No PT follow up;Outpatient PT ( outpatient PT vs. none based on patient progression) Equipment Recommended: Rolling walker with 5" wheels;Tub/shower seat PT Goals  Acute Rehab PT Goals PT Goal Formulation: With patient Time For Goal Achievement: 7 days Pt will go Supine/Side to Sit: Independently PT Goal: Supine/Side to Sit - Progress: Goal set today Pt will go Sit to Supine/Side: Independently PT Goal: Sit to Supine/Side - Progress: Goal set today Pt will go Sit to Stand: with modified  independence;with upper extremity assist PT Goal: Sit to Stand - Progress: Goal set today Pt will go Stand to Sit: with modified independence;with upper extremity assist PT Goal: Stand to Sit - Progress: Goal set today Pt will Ambulate: >150 feet;with supervision;with least restrictive assistive device PT Goal: Ambulate - Progress: Goal set today Pt will Go Up / Down Stairs: 1-2 stairs;with supervision;with least restrictive assistive device PT Goal: Up/Down Stairs - Progress: Goal set today  PT Evaluation Precautions/Restrictions  Precautions Precautions: Fall Restrictions Weight Bearing Restrictions: No Prior Functioning  Home Living Lives With: Spouse;Son Available Help at Discharge: Family Type of Home: House Home Access: Stairs to enter Secretary/administrator of Steps: 2 Entrance Stairs-Rails: Left Home Layout: One level Bathroom Shower/Tub: Forensic scientist: Standard Bathroom Accessibility: Yes How Accessible: Accessible via walker Home Adaptive Equipment: None Prior Function Level of Independence: Independent Able to Take Stairs?: Yes Driving: Yes Vocation: Full time employment (roofer) Cognition Cognition Arousal/Alertness: Awake/alert Overall Cognitive Status: Appears within functional limits for tasks assessed Orientation Level: Oriented X4 Sensation/Coordination Sensation Light Touch: Impaired Detail Light Touch Impaired Details: Impaired LLE Additional Comments: Pt reports decreased sensation with light touch to the left lateral calf and left posterior lower leg. Coordination Gross Motor Movements are Fluid and Coordinated: Yes Fine Motor Movements are Fluid and Coordinated: Yes Extremity Assessment RLE Assessment RLE Assessment: Within Functional Limits LLE Assessment LLE Assessment: Exceptions to [facility] LLE Strength Left Hip Flexion: 3+/5 Left Knee Flexion: 3+/5 Left Knee Extension: 3+/5 Left Ankle Dorsiflexion: 4/5 Left Ankle  Plantar Flexion: 4/5 Mobility (including Balance) Bed Mobility Bed Mobility: Yes Supine to Sit: 5: Supervision Supine to Sit Details (indicate cue type and reason): Pt required supervision for safety. Sitting - Scoot to Edge of Bed: 7: Independent Transfers Transfers: Yes Sit to Stand: 4: Min assist;From bed;With upper extremity assist Sit to Stand Details (indicate cue type and reason): Pt  required min assist for safety and due to LLE weakness. Stand to Sit: Other (comment);To chair/3-in-1;With upper extremity assist;With armrests (Min guard) Stand to Sit Details: Pt required min guard assist to control descent into chair. Ambulation/Gait Ambulation/Gait: Yes Ambulation/Gait Assistance: Other (comment) (Min guard) Ambulation/Gait Assistance Details (indicate cue type and reason): Pt required min guard assist due to decreased LLE weakness and impaired balance. Ambulation Distance (Feet): 100 Feet Assistive device: Rolling walker Gait Pattern: Step-through pattern;Decreased step length - left;Decreased hip/knee flexion - left;Decreased dorsiflexion - left Gait velocity: Decreased Stairs: No Wheelchair Mobility Wheelchair Mobility: No  Posture/Postural Control Posture/Postural Control: No significant limitations Balance Balance Assessed: Yes Static Sitting Balance Static Sitting - Balance Support: Bilateral upper extremity supported;Feet supported Static Sitting - Level of Assistance: 5: Stand by assistance Static Sitting - Comment/# of Minutes: Pt required stand by assistance for safety. Exercise    End of Session PT - End of Session Equipment Utilized During Treatment: Gait belt Activity Tolerance: Patient tolerated treatment well;Treatment limited secondary to medical complications (Comment) (Bradycardia (asymptomatic); abnormal rhythm on telemetry) Patient left: in chair;with call bell in reach Nurse Communication: Other (comment) (Bradycardia, abnormal rhythm, left calf  pain.) General Behavior During Session: [facility] for tasks performed Cognition: [facility] for tasks performed  Ezzard Standing SPT 12/31/2011, 3:03 PM

## 2011-12-31 NOTE — Progress Notes (Signed)
*  PRELIMINARY RESULTS* Echocardiogram 2D Echocardiogram has been performed.  Jeryl Columbia R 12/31/2011, 2:07 PM

## 2011-12-31 NOTE — Discharge Instructions (Signed)
STROKE/TIA DISCHARGE INSTRUCTIONS SMOKING Cigarette smoking nearly doubles your risk of having a stroke & is the single most alterable risk factor  If you smoke or have smoked in the last 12 months, you are advised to quit smoking for your health.  Most of the excess cardiovascular risk related to smoking disappears within a year of stopping.  Ask you doctor about anti-smoking medications  [place] Quit Line: 1-800-QUIT NOW  Free Smoking Cessation Classes ([phone]  CHOLESTEROL Know your levels; limit fat & cholesterol in your diet  Lipid Panel     Component Value Date/Time   CHOL  Value: 169        ATP III CLASSIFICATION:  <200     mg/dL   Desirable  119-147  mg/dL   Borderline High  >=829    mg/dL   High 56/21/3086 5784   TRIG 86 07/20/2007 0602   HDL 31* 07/20/2007 0602   CHOLHDL 5.5 07/20/2007 0602   VLDL 17 07/20/2007 0602   LDLCALC  Value: 121        Total Cholesterol/HDL:CHD Risk Coronary Heart Disease Risk Table                     Men   Women  1/2 Average Risk   3.4   3.3* 07/20/2007 0602      Many patients benefit from treatment even if their cholesterol is at goal.  Goal: Total Cholesterol (CHOL) less than 160  Goal:  Triglycerides (TRIG) less than 150  Goal:  HDL greater than 40  Goal:  LDL (LDLCALC) less than 100   BLOOD PRESSURE American Stroke Association blood pressure target is less that 120/80 mm/Hg  Your discharge blood pressure is:  BP: 147/87 mmHg  Monitor your blood pressure  Limit your salt and alcohol intake  Many individuals will require more than one medication for high blood pressure  DIABETES (A1c is a blood sugar average for last 3 months) Goal HGBA1c is under 7% (HBGA1c is blood sugar average for last 3 months)  Diabetes: {STROKE DC DIABETES:22357}    No results found for this basename: HGBA1C     Your HGBA1c can be lowered with medications, healthy diet, and exercise.  Check your blood sugar as directed by your physician  Call your  physician if you experience unexplained or low blood sugars.  PHYSICAL ACTIVITY/REHABILITATION Goal is 30 minutes at least 4 days per week    {STROKE DC ACTIVITY/REHAB:22359}  Activity decreases your risk of heart attack and stroke and makes your heart stronger.  It helps control your weight and blood pressure; helps you relax and can improve your mood.  Participate in a regular exercise program.  Talk with your doctor about the best form of exercise for you (dancing, walking, swimming, cycling).  DIET/WEIGHT Goal is to maintain a healthy weight  Your discharge diet is:   *** liquids Your height is:  Height: 5\' 9"  (175.3 cm) Your current weight is: Weight: 79.289 kg (174 lb 12.8 oz) Your Body Mass Index (BMI) is:  BMI (Calculated): 25.9   Following the type of diet specifically designed for you will help prevent another stroke.  Your goal weight range is:  ***  Your goal Body Mass Index (BMI) is 19-24.  Healthy food habits can help reduce 3 risk factors for stroke:  High cholesterol, hypertension, and excess weight.  RESOURCES Stroke/Support Group:  Call [phone]  they meet the 3rd Sunday of the month on the Rehab Unit at  [facility], [place] ( no meetings June, July & Aug).  STROKE EDUCATION PROVIDED/REVIEWED AND GIVEN TO PATIENT Stroke warning signs and symptoms How to activate emergency medical system (call 911). Medications prescribed at discharge. Need for follow-up after discharge. Personal risk factors for stroke. Pneumonia vaccine given:   {STROKE DC YES/NO/DATE:22363} Flu vaccine given:   {STROKE DC YES/NO/DATE:22363} My questions have been answered, the writing is legible, and I understand these instructions.  I will adhere to these goals & educational materials that have been provided to me after my discharge from the hospital.

## 2011-12-31 NOTE — Progress Notes (Signed)
Occupational Therapy Evaluation Patient Details Name: Carl [name] MRN: [id number] DOB: 15-Dec-1955 Today's Date: 12/31/2011  Problem List:  Patient Active Problem List  Diagnoses  . CVA (cerebral vascular accident)  . Hypertension  . Dyslipidemia  . Hypothyroidism    Past Medical History:  Past Medical History  Diagnosis Date  . Hypertension   . Hyperlipemia   . Hypothyroid    Past Surgical History: History reviewed. No pertinent past surgical history.  OT Assessment/Plan/Recommendation OT Assessment Clinical Impression Statement: Pt s/p Rt CVA ( Acute non hemorrhagic infarct involve the posterior right corona radiata).  Will benefit from acute OT services to address problem list in prep for d/c home with wife. OT Recommendation/Assessment: Patient will need skilled OT in the acute care venue OT Problem List: Decreased strength;Impaired balance (sitting and/or standing);Decreased coordination;Decreased knowledge of use of DME or AE;Pain;Impaired UE functional use OT Therapy Diagnosis : Acute pain;Generalized weakness;Paresis OT Plan OT Frequency: Min 2X/week OT Treatment/Interventions: Self-care/ADL training;Therapeutic exercise;Therapeutic activities;Patient/family education;Balance training;DME and/or AE instruction OT Recommendation Follow Up Recommendations: Outpatient OT (for LUE) Equipment Recommended: Rolling walker with 5" wheels;Tub/shower seat Individuals Consulted Consulted and Agree with Results and Recommendations: Patient OT Goals Acute Rehab OT Goals OT Goal Formulation: With patient Time For Goal Achievement: 7 days ADL Goals Pt Will Perform Grooming: with modified independence;Standing at sink;Other (comment) (using Lt hand) ADL Goal: Grooming - Progress: Goal set today Pt Will Transfer to Toilet: with modified independence;with DME;Ambulation;Regular height toilet ADL Goal: Toilet Transfer - Progress: Goal set today Pt Will Perform Tub/Shower Transfer:  Tub transfer;with supervision;Ambulation;with DME;Shower seat with back ADL Goal: Web designer - Progress: Goal set today Arm Goals Pt Will Complete Theraputty Exer: Independently;to increase strength;Left upper extremity;Min resistance putty Arm Goal: Theraputty Exercises - Progress: Goal set today Additional Arm Goal #1: Pt will perform 2-3 fine motor activities on handout dail to increase LUE fine motor coordination in prep for ADLs. Arm Goal: Additional Goal #1 - Progress: Goal set today  OT Evaluation Precautions/Restrictions  Precautions Precautions: Fall Restrictions Weight Bearing Restrictions: No Prior Functioning Home Living Lives With: Spouse;Son Available Help at Discharge: Family Type of Home: House Home Access: Stairs to enter Secretary/administrator of Steps: 2 Entrance Stairs-Rails: Left Home Layout: One level Bathroom Shower/Tub: Forensic scientist: Standard Bathroom Accessibility: Yes How Accessible: Accessible via walker Home Adaptive Equipment: None Prior Function Level of Independence: Independent Able to Take Stairs?: Yes Driving: Yes Vocation: Full time employment (roofer)  ADL ADL Upper Body Bathing: Simulated;Set up Upper Body Bathing Details (indicate cue type and reason): setup to gather items Where Assessed - Upper Body Bathing: Sitting, bed Lower Body Bathing: Simulated;Set up Lower Body Bathing Details (indicate cue type and reason): setup to gather items Where Assessed - Lower Body Bathing: Sitting, bed Upper Body Dressing: Simulated;Set up Upper Body Dressing Details (indicate cue type and reason): setup to gather items Where Assessed - Upper Body Dressing: Sitting, bed Lower Body Dressing: Simulated;Set up Lower Body Dressing Details (indicate cue type and reason): setup to gather items Where Assessed - Lower Body Dressing: Sitting, bed Toilet Transfer: Simulated;Minimal assistance Toilet Transfer Details  (indicate cue type and reason): min assist to perform sit to stand.  Pt ambulated with min guard assist for safety with RW. Toilet Transfer Method: Proofreader: Other (comment) Nurse, children's) Vision/Perception  Vision - History Baseline Vision: No visual deficits Patient Visual Report: No change from baseline Vision - Assessment Eye Alignment: Within Functional Limits Cognition Cognition Arousal/Alertness:  Awake/alert Overall Cognitive Status: Appears within functional limits for tasks assessed Orientation Level: Oriented X4 Sensation/Coordination Sensation Light Touch: Impaired Detail Light Touch Impaired Details: Impaired LUE Proprioception: Appears Intact Additional Comments: Pt reports decreased sensation with light touch to the left lateral calf and left posterior lower leg. Coordination Gross Motor Movements are Fluid and Coordinated: Yes Fine Motor Movements are Fluid and Coordinated: No (LUE) Extremity Assessment RUE Assessment RUE Assessment: Within Functional Limits LUE Assessment LUE Assessment: Exceptions to [facility] LUE Strength LUE Overall Strength Comments: Grip strength 3/5 Mobility  Bed Mobility Bed Mobility: Yes Supine to Sit: 5: Supervision Supine to Sit Details (indicate cue type and reason): Pt required supervision for safety. Sitting - Scoot to Edge of Bed: 7: Independent Transfers Sit to Stand: 4: Min assist;From bed;With upper extremity assist Sit to Stand Details (indicate cue type and reason): Pt required min assist for safety and due to LLE weakness. Stand to Sit: Other (comment);To chair/3-in-1;With upper extremity assist;With armrests (Min guard) Stand to Sit Details: Pt required min guard assist to control descent into chair. Exercises   End of Session OT - End of Session Equipment Utilized During Treatment: Gait belt Activity Tolerance: Patient tolerated treatment well;Other (comment) (bradycardia with ambulation  (symptomatic)) Patient left: in chair;with call bell in reach Nurse Communication: Mobility status for transfers;Mobility status for ambulation;Other (comment) (bradycardia, Lt calf pain) General Behavior During Session: [facility] for tasks performed Cognition: [facility] for tasks performed  4:30 PM  12/31/2011 Cipriano Mile OTR/L Pager [phone] Office [phone]

## 2011-12-31 NOTE — Progress Notes (Signed)
Patient ID: Carl [name]  male  NWG:[id number]    DOB: [birth year]    DOA: 12/30/2011  PCP: Herb Grays, MD, MD  Subjective: Persistent left-sided weakness, passed swallow study, difficulty walking  Objective: Weight change:   Intake/Output Summary (Last 24 hours) at 12/31/11 1141 Last data filed at 12/31/11 0600  Gross per 24 hour  Intake    450 ml  Output      0 ml  Net    450 ml   Blood pressure 144/87, pulse 44, temperature 98 F (36.7 C), temperature source Oral, resp. rate 18, height 5\' 9"  (1.753 m), weight 79.289 kg (174 lb 12.8 oz), SpO2 98.00%.  Physical Exam: General: Alert and awake, oriented x3, not in any acute distress. HEENT: anicteric sclera, pupils reactive to light and accommodation, EOMI CVS: S1-S2 clear, no murmur rubs or gallops Chest: clear to auscultation bilaterally, no wheezing, rales or rhonchi Abdomen: soft nontender, nondistended, normal bowel sounds, no organomegaly Extremities: no cyanosis, clubbing or edema noted bilaterally Neuro: Strength 5/5 in right upper and lower extremities, left upper and lower extremity 3/5  Lab Results: Basic Metabolic Panel:  Lab 12/31/11 5784 12/30/11 1850  NA -- 137  K -- 3.9  CL -- 104  CO2 -- 23  GLUCOSE -- 80  BUN -- 18  CREATININE 0.79 0.88  CALCIUM -- 9.4  MG -- --  PHOS -- --   Liver Function Tests:  Lab 12/30/11 1850  AST 37  ALT 31  ALKPHOS 58  BILITOT 0.3  PROT 7.5  ALBUMIN 4.1   CBC:  Lab 12/31/11 0705 12/30/11 1850  WBC 5.4 9.0  NEUTROABS -- 5.7  HGB 14.5 16.0  HCT 43.8 47.6  MCV 92.2 92.6  PLT 177 205   Cardiac Enzymes: No results found for this basename: CKTOTAL:3,CKMB:3,CKMBINDEX:3,TROPONINI:3 in the last 168 hours BNP: No components found with this basename: POCBNP:2 CBG:  Lab 12/30/11 1816  GLUCAP 81     Micro Results: No results found for this or any previous visit (from the past 240 hour(s)).  Studies/Results: Dg Chest 2 View  12/30/2011  *RADIOLOGY REPORT*   Clinical Data: Numbness left-sided body.  Smoker.  CHEST - 2 VIEW  Comparison: 10/08/2009.  Findings: Chronic bronchitis type changes with peribronchial thickening.  Calcified mildly tortuous aorta.  No infiltrate, congestive heart failure or pneumothorax.  Prominent sclerosis anterior aspect first rib unchanged.  IMPRESSION: Calcified mildly tortuous aorta.  Peribronchial thickening suggestive of chronic bronchitis type changes unchanged.  Original Report Authenticated By: Fuller Canada, M.D.   Ct Head Wo Contrast  12/30/2011  *RADIOLOGY REPORT*  Clinical Data: Onset of left extremity weakness  CT HEAD WITHOUT CONTRAST  Technique:  Contiguous axial images were obtained from the base of the skull through the vertex without contrast.  Comparison: None.  Findings: No intracranial hemorrhage.  Question subtle infarct mid to right corona radiata/posteriorly right internal capsule.  No hydrocephalus. No intracranial mass lesion detected on this unenhanced exam.  Mega cisterna magna incidentally noted.  IMPRESSION: Question right hemispheric infarct as noted above.  Called report to Dr. Freida Busman.  The patient is not under Dr. West Carbo care.  Presently searching for responsible clinician to give report [facility] CT technologist)  Original Report Authenticated By: Fuller Canada, M.D.   Mr Brain Wo Contrast  12/31/2011  *RADIOLOGY REPORT*  Clinical Data:  Stroke.  New onset of left-sided weakness.  MRI HEAD WITHOUT CONTRAST MRA HEAD WITHOUT CONTRAST  Technique:  Multiplanar, multiecho pulse sequences  of the brain and surrounding structures were obtained without intravenous contrast. Angiographic images of the head were obtained using MRA technique without contrast.  Comparison:  CT head without contrast 12/30/2011.  MRI HEAD  Findings:  A focal area of restricted diffusion is evident in the posterior right corona radiata with some signal extending into the posterior limb of the right internal capsule.  T2 and FLAIR  hyperintensities associated with this lesion, compatible with the given time frame. No other significant white matter disease is present.  There are remote infarcts.  No hemorrhage or mass lesion is evident.  Flow is present in the major intracranial arteries.  The globes and orbits are intact.  Mild mucosal thickening is present throughout the ethmoid air cells.  The maxillary sinuses are shrunken bilaterally.  Frontal sinuses and sphenoid sinuses are clear.  The mastoid air cells are clear.  IMPRESSION:  1.  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 2.  No other remote infarcts or significant white matter disease. 3.  Chronic ethmoid and maxillary sinus disease.  MRA HEAD  Findings: The internal carotid arteries are within normal limits bilaterally.  The A1 and M1 segments are normal.  No anterior communicating artery is evident.  There is some attenuation of distal MCA branch vessels.  No significant proximal stenosis or occlusion is present.  There is some attenuation of distal ACA branch vessels.  The right vertebral artery is slightly dominant to the left.  The PICA origins are visualized bilaterally.  The basilar artery is within normal limits.  Both posterior cerebral arteries originate from the basilar tip.  There is some attenuation of distal PCA branch vessels.  The study is mildly degraded by patient motion.  IMPRESSION:  1.  Mild distal small vessel disease. 2.  No other significant proximal stenosis, aneurysm, or branch vessel occlusion.  Original Report Authenticated By: Jamesetta Orleans. MATTERN, M.D.   Mr Maxine Glenn Head/brain Wo Cm  12/31/2011  *RADIOLOGY REPORT*  Clinical Data:  Stroke.  New onset of left-sided weakness.  MRI HEAD WITHOUT CONTRAST MRA HEAD WITHOUT CONTRAST  Technique:  Multiplanar, multiecho pulse sequences of the brain and surrounding structures were obtained without intravenous contrast.  Angiographic images of the head were obtained using MRA technique without contrast.  Comparison:  CT head without contrast 12/30/2011.  MRI HEAD  Findings:  A focal area of restricted diffusion is evident in the posterior right corona radiata with some signal extending into the posterior limb of the right internal capsule.  T2 and FLAIR hyperintensities associated with this lesion, compatible with the given time frame. No other significant white matter disease is present.  There are remote infarcts.  No hemorrhage or mass lesion is evident.  Flow is present in the major intracranial arteries.  The globes and orbits are intact.  Mild mucosal thickening is present throughout the ethmoid air cells.  The maxillary sinuses are shrunken bilaterally.  Frontal sinuses and sphenoid sinuses are clear.  The mastoid air cells are clear.  IMPRESSION:  1.  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 2.  No other remote infarcts or significant white matter disease. 3.  Chronic ethmoid and maxillary sinus disease.  MRA HEAD  Findings: The internal carotid arteries are within normal limits bilaterally.  The A1 and M1 segments are normal.  No anterior communicating artery  is evident.  There is some attenuation of distal MCA branch vessels.  No significant proximal stenosis or occlusion is present.  There is some attenuation of distal ACA branch vessels.  The right vertebral artery is slightly dominant to the left.  The PICA origins are visualized bilaterally.  The basilar artery is within normal limits.  Both posterior cerebral arteries originate from the basilar tip.  There is some attenuation of distal PCA branch vessels.  The study is mildly degraded by patient motion.  IMPRESSION:  1.  Mild distal small vessel disease. 2.  No other significant proximal stenosis, aneurysm, or branch vessel occlusion.  Original Report Authenticated By:  Jamesetta Orleans. MATTERN, M.D.    Medications: Scheduled Meds:   . sodium chloride   Intravenous Once  . acetaminophen  650 mg Oral Once  . aspirin  324 mg Oral Once  . aspirin  300 mg Rectal Daily   Or  . aspirin  325 mg Oral Daily  . atorvastatin  20 mg Oral q1800  . enoxaparin  40 mg Subcutaneous Q24H  . HYDROcodone-acetaminophen  2 tablet Oral Once  . levothyroxine  75 mcg Oral Daily  . nicotine  14 mg Transdermal Daily  . pantoprazole  40 mg Oral Daily  . pneumococcal 23 valent vaccine  0.5 mL Intramuscular Tomorrow-1000   Continuous Infusions:   . sodium chloride 75 mL/hr at 12/31/11 0117     Assessment/Plan: Principal Problem:  *Acute right CVA (cerebral vascular accident) confirmed on MRI - Stroke workup ongoing - Carotid Dopplers, 2-D echocardiogram pending, ldl 58 - Passed a swallow study, started on heart healthy diet - Currently patient on aspirin, however he states that he takes aspirin at home. Will add Plavix, follow stroke service recommendations. I discontinued aspirin after discussing with neurology. - PT OT evaluation, may be a good candidate for CIR given patient was ambulating without difficulty prior to this stroke  Active Problems: Bradycardia: Asymptomatic, obtain 12-lead EKG to rule out any heart block, not on any beta blocker. Was on Norvasc at home which has been held.  - Obtain TSH, T4 and T3   Hypertension: - BP slightly elevated, however due to acute CVA hold off on major adjustments to BP meds   Dyslipidemia: LDL 58, continue statin   Hypothyroidism:  Obtain TSH, prior TSH 06/21/2010 was 6.2  DVT Prophylaxis: Lovenox  Disposition: Await PT recommendations, stroke team followup   LOS: 1 day   Naava Janeway M.D. [facility] 12/31/2011, 11:41 AM Pager: [phone]

## 2011-12-31 NOTE — Progress Notes (Signed)
12/31/2011 Carl [name] SPARKS Case Management Note [phone]  Utilization review completed.  

## 2011-12-31 NOTE — Evaluation (Signed)
Agree with student PT evaluation note.  Maley Venezia, PT DPT [phone]  

## 2011-12-31 NOTE — Consult Note (Signed)
Pt smokes 1- 1 1/2 ppd and has never quit before. He is not quite ready to quit but in contemplation stage. Encouraged pt to quit and discussed the risk factors to his health. Pt voices understanding. Referred to 1-800 quit now for f/u and support. Discussed oral fixation substitutes, second hand smoke and in home smoking policy. Reviewed and gave pt Written education/contact information.

## 2011-12-31 NOTE — Progress Notes (Addendum)
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 Joylene John

## 2012-01-01 DIAGNOSIS — M79609 Pain in unspecified limb: Secondary | ICD-10-CM

## 2012-01-01 DIAGNOSIS — R001 Bradycardia, unspecified: Secondary | ICD-10-CM

## 2012-01-01 DIAGNOSIS — I498 Other specified cardiac arrhythmias: Secondary | ICD-10-CM

## 2012-01-01 LAB — CARDIAC PANEL(CRET KIN+CKTOT+MB+TROPI)
Relative Index: 1.5 (ref 0.0–2.5)
Relative Index: 1.7 (ref 0.0–2.5)
Total CK: 125 U/L (ref 7–232)
Troponin I: <0.3 ng/mL (ref <0.30)

## 2012-01-01 LAB — HEMOGLOBIN A1C: Mean Plasma Glucose: 114 mg/dL (ref <117)

## 2012-01-01 MED ORDER — NITROGLYCERIN 0.3 MG SL SUBL
0.3000 mg | SUBLINGUAL_TABLET | SUBLINGUAL | Status: DC | PRN
  Administered 2012-01-01 (×2): 0.3 mg via SUBLINGUAL
  Filled 2012-01-01: qty 100

## 2012-01-01 NOTE — Progress Notes (Signed)
Patient on telemetry, going from Sinus Bradycardia to Atrial Flutter. Carl Oaks, MD and ordered to have an EKG when patient is having Atrial Flutter. Will continue to monitor patient.

## 2012-01-01 NOTE — Progress Notes (Signed)
Patient given Nitro SL x2 for chest discomfort. Pt states the medicine has been effective. See eMAR for more information. Will continue to monitor.

## 2012-01-01 NOTE — Progress Notes (Signed)
Occupational Therapy Treatment Patient Details Name: Carl [name] MRN: [id number] DOB: 30-May-1956 Today's Date: 01/01/2012     OT Goals ADL Goals Pt Will Transfer to Toilet: with modified independence;with DME;Ambulation;Regular height toilet ADL Goal: Toilet Transfer - Progress: Progressing toward goals Arm Goals Pt Will Complete Theraputty Exer: Independently;to increase strength;Left upper extremity;Min resistance putty Arm Goal: Theraputty Exercises - Progress: Progressing toward goal Additional Arm Goal #1: Pt will perform 2-3 fine motor activities on handout dail to increase LUE fine motor coordination in prep for ADLs. Arm Goal: Additional Goal #1 - Progress: Progressing toward goals  OT Treatment     ADL ADL Toilet Transfer: Performed;Supervision/safety Toilet Transfer Method: Proofreader: Regular height toilet;Other (comment) (standing to urinate) Toileting - Clothing Manipulation: Performed;Supervision/safety Where Assessed - Toileting Clothing Manipulation: Standing Equipment Used: Rolling walker ADL Comments: Pt educated on Fine motor and provided theraputty with x2 handouts for hand exercises. Pt with great return demol Mobility  Transfers Transfers: Yes Sit to Stand: 5: Supervision;With upper extremity assist;From chair/3-in-1 Exercises    End of Session OT - End of Session Activity Tolerance: Patient tolerated treatment well General Behavior During Session: [facility] for tasks performed Cognition: [facility] for tasks performed  Lucile Shutters  01/01/2012, 3:05 PM Pager: [phone]

## 2012-01-01 NOTE — Consult Note (Signed)
CARDIOLOGY CONSULT NOTE    Patient ID: Carl [name] MRN: [id number] DOB/AGE: 56/28/1957 56 y.o.  Admit date: 12/30/2011 Referring Physician: Isidoro Donning Primary Physician: Herb Grays, MD, MD Primary Cardiologist:  Fanny Skates Reason for Consultation: ? Flutter and bradycardia  Principal Problem:  *CVA (cerebral vascular accident) Active Problems:  Hypertension  Dyslipidemia  Hypothyroidism   HPI:  55 you admitted to hospital with CVA.  Paresthesias in RUE and LLE weakness.  MRI with internal capsule stroke, nonhemispheral and no bleed.  Has regained some strength in leg.  ECG and telemetry have Shown sinus bradycardia as low as 38-40.  Asymptomatic.  No history of syncope.  Review of telemetry shows 5-6 beats of more rapid narrow complex rhythm ? PSVT or multiple PAC;s.  Do not think this was flutter or  PAF.  Had SSCP this am.  Atypical sharp and in shoulders.  ECG at time of pain nonischemic and reviewed.  Cath by Dr Jacquelynn Cree 2011 with no fixed epicardial disease ? Mild vasospasm RCA.  Echo done this hospitalization  Normal with no SOE and no RWMA;s.  Patient denies palpitatios, syncope or dyspnea  @ROS @ All other systems reviewed and negative except as noted above  Past Medical History  Diagnosis Date  . Hypertension   . Hyperlipemia   . Hypothyroid     Family History  Problem Relation Age of Onset  . Coronary artery disease      History   Social History  . Marital Status: Married    Spouse Name: [name]    Number of Children: [name]  . Years of Education: [name]   Occupational History  . Not on file.   Social History Main Topics  . Smoking status: Current Everyday Smoker -- 1.0 packs/day  . Smokeless tobacco: Not on file  . Alcohol Use: Yes  . Drug Use: No  . Sexually Active: Not on file   Other Topics Concern  . Not on file   Social History Narrative  . No narrative on file    History reviewed. No pertinent past surgical history.      Marland Kitchen atorvastatin  20 mg  Oral q1800  . clopidogrel  75 mg Oral Q breakfast  . enoxaparin  40 mg Subcutaneous Q24H  . levothyroxine  75 mcg Oral Daily  . nicotine  14 mg Transdermal Daily  . pantoprazole  40 mg Oral Daily  . DISCONTD: aspirin  325 mg Oral Daily      . DISCONTD: sodium chloride 75 mL/hr at 01/01/12 4098    Physical Exam: Blood pressure 136/83, pulse 46, temperature 97.7 F (36.5 C), temperature source Oral, resp. rate 18, height 5\' 9"  (1.753 m), weight 174 lb 12.8 oz (79.289 kg), SpO2 98.00%.   Affect appropriate Somewhat deshelveled  HEENT: normal Neck supple with no adenopathy JVP normal no bruits no thyromegaly Lungs clear with no wheezing and good diaphragmatic motion Heart:  S1/S2 SEM  no rub, gallop or click PMI normal Abdomen: benighn, BS positve, no tenderness, no AAA no bruit.  No HSM or HJR Distal pulses intact with no bruits No edema Neuro paresthesia LUE and mild persistant weakness LLE Skin warm and dry No muscular weakness   Labs:   Lab Results  Component Value Date   WBC 5.4 12/31/2011   HGB 14.5 12/31/2011   HCT 43.8 12/31/2011   MCV 92.2 12/31/2011   PLT 177 12/31/2011    Lab 12/31/11 0705 12/30/11 1850  NA -- 137  K -- 3.9  CL -- 104  CO2 -- 23  BUN -- 18  CREATININE 0.79 --  CALCIUM -- 9.4  PROT -- 7.5  BILITOT -- 0.3  ALKPHOS -- 58  ALT -- 31  AST -- 37  GLUCOSE -- 80   Lab Results  Component Value Date   CKTOTAL 111 10/09/2009   CKMB 1.4 10/09/2009   TROPONINI  Value: 0.01        NO INDICATION OF MYOCARDIAL INJURY. 10/09/2009    Lab Results  Component Value Date   CHOL 154 12/31/2011   CHOL  Value: 169        ATP III CLASSIFICATION:  <200     mg/dL   Desirable  161-096  mg/dL   Borderline High  >=045    mg/dL   High 40/98/1191   CHOL  Value: 268        ATP III CLASSIFICATION:  <200     mg/dL   Desirable  478-295  mg/dL   Borderline High  >=621    mg/dL   High* 11/26/6576   Lab Results  Component Value Date   HDL 71 12/31/2011   HDL 31*  07/20/2007   HDL 28* 06/11/2007   Lab Results  Component Value Date   LDLCALC 58 12/31/2011   LDLCALC  Value: 121        Total Cholesterol/HDL:CHD Risk Coronary Heart Disease Risk Table                     Men   Women  1/2 Average Risk   3.4   3.3* 07/20/2007   LDLCALC  Value: 168        Total Cholesterol/HDL:CHD Risk Coronary Heart Disease Risk Table                     Men   Women  1/2 Average Risk   3.4   3.3* 06/11/2007   Lab Results  Component Value Date   TRIG 126 12/31/2011   TRIG 86 07/20/2007   TRIG 361* 06/11/2007   Lab Results  Component Value Date   CHOLHDL 2.2 12/31/2011   CHOLHDL 5.5 07/20/2007   CHOLHDL 9.6 06/11/2007   No results found for this basename: LDLDIRECT      Radiology: Dg Chest 2 View  12/30/2011  *RADIOLOGY REPORT*  Clinical Data: Numbness left-sided body.  Smoker.  CHEST - 2 VIEW  Comparison: 10/08/2009.  Findings: Chronic bronchitis type changes with peribronchial thickening.  Calcified mildly tortuous aorta.  No infiltrate, congestive heart failure or pneumothorax.  Prominent sclerosis anterior aspect first rib unchanged.  IMPRESSION: Calcified mildly tortuous aorta.  Peribronchial thickening suggestive of chronic bronchitis type changes unchanged.  Original Report Authenticated By: Fuller Canada, M.D.   Ct Head Wo Contrast  12/30/2011  *RADIOLOGY REPORT*  Clinical Data: Onset of left extremity weakness  CT HEAD WITHOUT CONTRAST  Technique:  Contiguous axial images were obtained from the base of the skull through the vertex without contrast.  Comparison: None.  Findings: No intracranial hemorrhage.  Question subtle infarct mid to right corona radiata/posteriorly right internal capsule.  No hydrocephalus. No intracranial mass lesion detected on this unenhanced exam.  Mega cisterna magna incidentally noted.  IMPRESSION: Question right hemispheric infarct as noted above.  Called report to Dr. Freida Busman.  The patient is not under Dr. West Carbo care.  Presently searching  for responsible clinician to give report [facility] CT technologist)  Original Report Authenticated By: Fuller Canada, M.D.  Mr Brain Wo Contrast  12/31/2011  *RADIOLOGY REPORT*  Clinical Data:  Stroke.  New onset of left-sided weakness.  MRI HEAD WITHOUT CONTRAST MRA HEAD WITHOUT CONTRAST  Technique:  Multiplanar, multiecho pulse sequences of the brain and surrounding structures were obtained without intravenous contrast. Angiographic images of the head were obtained using MRA technique without contrast.  Comparison:  CT head without contrast 12/30/2011.  MRI HEAD  Findings:  A focal area of restricted diffusion is evident in the posterior right corona radiata with some signal extending into the posterior limb of the right internal capsule.  T2 and FLAIR hyperintensities associated with this lesion, compatible with the given time frame. No other significant white matter disease is present.  There are remote infarcts.  No hemorrhage or mass lesion is evident.  Flow is present in the major intracranial arteries.  The globes and orbits are intact.  Mild mucosal thickening is present throughout the ethmoid air cells.  The maxillary sinuses are shrunken bilaterally.  Frontal sinuses and sphenoid sinuses are clear.  The mastoid air cells are clear.  IMPRESSION:  1.  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 2.  No other remote infarcts or significant white matter disease. 3.  Chronic ethmoid and maxillary sinus disease.  MRA HEAD  Findings: The internal carotid arteries are within normal limits bilaterally.  The A1 and M1 segments are normal.  No anterior communicating artery is evident.  There is some attenuation of distal MCA branch vessels.  No significant proximal stenosis or occlusion is present.  There is some attenuation of distal ACA branch vessels.  The right vertebral artery is slightly dominant to the left.   The PICA origins are visualized bilaterally.  The basilar artery is within normal limits.  Both posterior cerebral arteries originate from the basilar tip.  There is some attenuation of distal PCA branch vessels.  The study is mildly degraded by patient motion.  IMPRESSION:  1.  Mild distal small vessel disease. 2.  No other significant proximal stenosis, aneurysm, or branch vessel occlusion.  Original Report Authenticated By: Jamesetta Orleans. MATTERN, M.D.   Mr Maxine Glenn Head/brain Wo Cm  12/31/2011  *RADIOLOGY REPORT*  Clinical Data:  Stroke.  New onset of left-sided weakness.  MRI HEAD WITHOUT CONTRAST MRA HEAD WITHOUT CONTRAST  Technique:  Multiplanar, multiecho pulse sequences of the brain and surrounding structures were obtained without intravenous contrast. Angiographic images of the head were obtained using MRA technique without contrast.  Comparison:  CT head without contrast 12/30/2011.  MRI HEAD  Findings:  A focal area of restricted diffusion is evident in the posterior right corona radiata with some signal extending into the posterior limb of the right internal capsule.  T2 and FLAIR hyperintensities associated with this lesion, compatible with the given time frame. No other significant white matter disease is present.  There are remote infarcts.  No hemorrhage or mass lesion is evident.  Flow is present in the major intracranial arteries.  The globes and orbits are intact.  Mild mucosal thickening is present throughout the ethmoid air cells.  The maxillary sinuses are shrunken bilaterally.  Frontal sinuses and sphenoid sinuses are clear.  The mastoid air cells are clear.  IMPRESSION:  1.  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 2.  No other remote infarcts or significant white matter disease.  3.  Chronic ethmoid and maxillary sinus disease.  MRA HEAD  Findings: The internal carotid arteries are within normal  limits bilaterally.  The A1 and M1 segments are normal.  No anterior communicating artery is evident.  There is some attenuation of distal MCA branch vessels.  No significant proximal stenosis or occlusion is present.  There is some attenuation of distal ACA branch vessels.  The right vertebral artery is slightly dominant to the left.  The PICA origins are visualized bilaterally.  The basilar artery is within normal limits.  Both posterior cerebral arteries originate from the basilar tip.  There is some attenuation of distal PCA branch vessels.  The study is mildly degraded by patient motion.  IMPRESSION:  1.  Mild distal small vessel disease. 2.  No other significant proximal stenosis, aneurysm, or branch vessel occlusion.  Original Report Authenticated By: Jamesetta Orleans. MATTERN, M.D.    EKG: Sinus brady no AV block normal intervals and no ischemia Telemetry:  Sinus brady occasional PAC/PVC  5 beat ? PAT   ASSESSMENT AND PLAN:  CVA: Internal capsule not hemispheral  Agree with ASA and Plavix.  Stop smoking and Rx with statin Bradycardia:  Asymptomatic no evidence of ischemia.  Avoid AV nodal blocking drugs.  No indication for pacer PAT:  Do not think he had flutter.  Continue telemetry.   Chest Pain:  R/O ECG nonischemic, Echo normal and no significant disease on cath 2011  Patient to be followed by Dr Jim Like and [facility].  Discussed with him  Signed: Charlton Haws 01/01/2012, 12:35 PM

## 2012-01-01 NOTE — Progress Notes (Signed)
Stroke Team Progress Note  HISTORY Carl [name] is an 56 y.o. male gentleman who woke up  with the left-sided numbness 12/30/2011. This persisted all day. He finally decided to come to the ER at 7 PM and was dragging in his left lower extremity. He denies any swallowing difficulty. He states is compliant with medication. He does have a history of hypertension. He denies any altered mentation, headaches, nausea, vomiting, palpitations. He continues to have left hand, tricep, and shoulder abduction weakness. He states he has been on ASA 81 mg daily. 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 Patient was not a TPA candidate secondary to delary in arrival. He was admitted for further evaluation and treatment.  SUBJECTIVE His family is at the bedside.  Overall he feels his condition is stable. Concerned with ? atrial fibrillation. Cardiologist, Dr. Eldridge Dace has been consulted.  OBJECTIVE Most recent Vital Signs: Filed Vitals:   12/31/11 2200 01/01/12 0200 01/01/12 0600 01/01/12 0924  BP: 150/80 120/73 173/95 136/83  Pulse: 50 81 48 46  Temp: 98.4 F (36.9 C) 97.5 F (36.4 C) 97.9 F (36.6 C) 97.7 F (36.5 C)  TempSrc:    Oral  Resp: 18 18 18 18   Height:      Weight:      SpO2: 96% 97% 97% 98%   CBG (last 3)  Basename 12/30/11 1816  GLUCAP 81   Intake/Output from previous day: 04/11 0701 - 04/12 0700 In: 1265 [P.O.:440; I.V.:825] Out: -   IV Fluid Intake:     . DISCONTD: sodium chloride 75 mL/hr at 01/01/12 2130   MEDICATIONS    . atorvastatin  20 mg Oral q1800  . clopidogrel  75 mg Oral Q breakfast  . enoxaparin  40 mg Subcutaneous Q24H  . levothyroxine  75 mcg Oral Daily  . nicotine  14 mg Transdermal Daily  . pantoprazole  40 mg Oral Daily  . DISCONTD: aspirin  300 mg Rectal Daily  . DISCONTD: aspirin  325 mg Oral Daily  . DISCONTD: aspirin  325 mg Oral Daily  .  DISCONTD: clopidogrel  75 mg Oral Q breakfast   PRN:  cloNIDine, HYDROcodone-acetaminophen, senna-docusate  Diet:  Cardiac thin liquids Activity:   Bathroom privileges DVT Prophylaxis:  Lovenox 40 mg sq daily   CLINICALLY SIGNIFICANT STUDIES CBC    Component Value Date/Time   WBC 5.4 12/31/2011 0705   RBC 4.75 12/31/2011 0705   HGB 14.5 12/31/2011 0705   HCT 43.8 12/31/2011 0705   PLT 177 12/31/2011 0705   MCV 92.2 12/31/2011 0705   MCH 30.5 12/31/2011 0705   MCHC 33.1 12/31/2011 0705   RDW 14.3 12/31/2011 0705   LYMPHSABS 2.3 12/30/2011 1850   MONOABS 0.8 12/30/2011 1850   EOSABS 0.1 12/30/2011 1850   BASOSABS 0.1 12/30/2011 1850   CMP    Component Value Date/Time   NA 137 12/30/2011 1850   K 3.9 12/30/2011 1850   CL 104 12/30/2011 1850   CO2 23 12/30/2011 1850   GLUCOSE 80 12/30/2011 1850   BUN 18 12/30/2011 1850   CREATININE 0.79 12/31/2011 0705   CALCIUM 9.4 12/30/2011 1850   PROT 7.5 12/30/2011 1850   ALBUMIN 4.1 12/30/2011 1850   AST 37 12/30/2011 1850   ALT 31 12/30/2011 1850   ALKPHOS 58 12/30/2011 1850   BILITOT 0.3 12/30/2011 1850   GFRNONAA >90 12/31/2011 0705   GFRAA >90  12/31/2011 0705   COAGS Lab Results  Component Value Date   INR 0.94 12/30/2011   INR 0.92 10/08/2009   INR 0.9 07/19/2007   Lipid Panel    Component Value Date/Time   CHOL 154 12/31/2011 0705   TRIG 126 12/31/2011 0705   HDL 71 12/31/2011 0705   CHOLHDL 2.2 12/31/2011 0705   VLDL 25 12/31/2011 0705   LDLCALC 58 12/31/2011 0705   HgbA1C  Lab Results  Component Value Date   HGBA1C 5.6 12/31/2011   Cardiac Panel (last 3 results) No results found for this basename: CKTOTAL:3,CKMB:3,TROPONINI:3,RELINDX:3 in the last 72 hours Urinalysis    Component Value Date/Time   COLORURINE YELLOW 06/12/2007 1531   APPEARANCEUR CLEAR 06/12/2007 1531   LABSPEC 1.021 06/12/2007 1531   PHURINE 5.5 06/12/2007 1531   GLUCOSEU NEGATIVE 06/12/2007 1531   HGBUR NEGATIVE 06/12/2007 1531   BILIRUBINUR NEGATIVE 06/12/2007 1531    KETONESUR NEGATIVE 06/12/2007 1531   PROTEINUR NEGATIVE 06/12/2007 1531   UROBILINOGEN 0.2 06/12/2007 1531   NITRITE NEGATIVE 06/12/2007 1531   LEUKOCYTESUR NEGATIVE MICROSCOPIC NOT DONE ON URINES WITH NEGATIVE PROTEIN, BLOOD, LEUKOCYTES, NITRITE, OR GLUCOSE <1000 mg/dL. 06/12/2007 1531   Urine Drug Screen  No results found for this basename: labopia, cocainscrnur, labbenz, amphetmu, thcu, labbarb    Alcohol Level No results found for this basename: eth   Results for orders placed during the hospital encounter of 12/30/11 (from the past 24 hour(s))  TSH     Status: Normal   Collection Time   12/31/11  1:04 PM      Component Value Range   TSH 2.483  0.350 - 4.500 (uIU/mL)  T4, FREE     Status: Normal   Collection Time   12/31/11  1:04 PM      Component Value Range   Free T4 1.04  0.80 - 1.80 (ng/dL)  T3     Status: Abnormal   Collection Time   12/31/11  1:04 PM      Component Value Range   T3, Total 78.3 (*) 80.0 - 204.0 (ng/dl)  HEMOGLOBIN M5H     Status: Normal   Collection Time   12/31/11  1:04 PM      Component Value Range   Hemoglobin A1C 5.6  <5.7 (%)   Mean Plasma Glucose 114  <117 (mg/dL)   CT of the brain  8/46/9629 Question right hemispheric infarct   MRI of the brain  12/30/2011  1.  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 2.  No other remote infarcts or significant white matter disease. 3.  Chronic ethmoid and maxillary sinus disease.   MRA of the brain  12/30/2011 1.  Mild distal small vessel disease. 2.  No other significant proximal stenosis, aneurysm, or branch vessel occlusion.   2D Echocardiogram  EF 65-70% with no source of embolus.   Carotid Doppler  No internal carotid artery stenosis bilaterally. Vertebrals with antegrade flow bilaterally.   LE Venous Doppler  Left lower extremity venous duplex has been completed. Preliminary findings: Left= No evidence of DVT or  baker's cyst.  CXR  12/30/2011 Calcified mildly tortuous aorta.  Peribronchial thickening suggestive of chronic bronchitis type changes unchanged  EKG  sinus bradycardia.   Physical Exam   Awake alert. Afebrile. Head is nontraumatic. Neck is supple without bruit. Hearing is normal. Cardiac exam no murmur or gallop. Lungs are clear to auscultation. Distal pulses are  well felt.   Neurologic Exam ;Awake alert oriented x 3 normal speech and language. Mild left lower face asymmetry. Tongue midline. No drift. Mild diminished fine finger movements on left. Orbits right over left upper extremity. Mild left grip weak.. Normal sensation . Normal coordination.  ASSESSMENT Mr. GRAIG HESSLING is a 56 y.o. male with a posterior right corona radiata and extending in the posterior limb of the right internal capsule, secondary to possible atrial fibrillation/flutter. Cariology is to see. Stroke work up underway. On no antiplatelets prior to admission. Now on clopidogrel 75 mg orally every day for secondary stroke prevention. Recommend warfarin or other anticoagulant if dx with atrial fibrillation/flutter.   -?atrial fibrillation -hypertension -hyperlipidemia  Hospital day # 2  TREATMENT/PLAN -Continue clopidogrel 75 mg orally every day for secondary stroke prevention. Change to anticoagulant if dx with atrial fibrillation/flutter -OP OT recommended   SHARON BIBY, AVNP, ANP-BC, GNP-BC [facility] Pager: [phone] 01/01/2012 11:18 AM  Dr. Delia Heady, Stroke Center Medical Director, has personally reviewed chart, pertinent data, examined the patient and developed the plan of care.

## 2012-01-01 NOTE — Progress Notes (Signed)
Physical Therapy Treatment Patient Details Name: Carl [name] MRN: [id number] DOB: [birth year] Today's Date: 01/01/2012  PT Assessment/Plan  PT - Assessment/Plan Comments on Treatment Session: Pt progressing well although still requires assist for balance and L weakness. Will continue therapy for safety PT Plan: Discharge plan remains appropriate;Frequency remains appropriate PT Frequency: Min 4X/week Follow Up Recommendations: Outpatient PT;Supervision/Assistance - 24 hour Equipment Recommended: Rolling walker with 5" wheels;Tub/shower seat PT Goals  Acute Rehab PT Goals PT Goal Formulation: With patient PT Goal: Supine/Side to Sit - Progress: Progressing toward goal PT Goal: Sit to Supine/Side - Progress: Progressing toward goal PT Goal: Sit to Stand - Progress: Progressing toward goal PT Goal: Stand to Sit - Progress: Progressing toward goal PT Goal: Ambulate - Progress: Progressing toward goal PT Goal: Up/Down Stairs - Progress: Progressing toward goal  PT Treatment Precautions/Restrictions  Precautions Precautions: Fall Restrictions Weight Bearing Restrictions: No Mobility (including Balance) Bed Mobility Bed Mobility: Yes Supine to Sit: 6: Modified independent (Device/Increase time) Sitting - Scoot to Edge of Bed: 6: Modified independent (Device/Increase time) Transfers Transfers: Yes Sit to Stand: 6: Modified independent (Device/Increase time) Stand to Sit: 6: Modified independent (Device/Increase time) Ambulation/Gait Ambulation/Gait: Yes Ambulation/Gait Assistance: 4: Min assist Ambulation/Gait Assistance Details (indicate cue type and reason): Min assist for ambulation secondary to balance loss. Attempted ambulation without RW, pt extremely unsteady and unable to maintain balance without max assist, therefore min assist with RW for safety. Ambulation Distance (Feet): 150 Feet Assistive device: Rolling walker Gait Pattern: Step-through pattern;Decreased step length  - left;Decreased hip/knee flexion - left;Decreased dorsiflexion - left Gait velocity: Decreased Stairs: Yes Stairs Assistance: 5: Supervision Stairs Assistance Details (indicate cue type and reason): VC for sequencing and safety Stair Management Technique: One rail Left;Forwards Number of Stairs: 6     Exercise  General Exercises - Lower Extremity Heel Slides: AROM;Strengthening;Left;10 reps Straight Leg Raises: AROM;Strengthening;Left;10 reps;Supine End of Session PT - End of Session Equipment Utilized During Treatment: Gait belt Activity Tolerance: Patient tolerated treatment well;Treatment limited secondary to medical complications (Comment) Patient left: with call bell in reach;in bed Nurse Communication: Other (comment) General Behavior During Session: [facility] for tasks performed Cognition: [facility] for tasks performed  Carl [name] 01/01/2012, 4:50 PM  01/01/2012 Carl [name] DPT PAGER: [phone] OFFICE: [phone]

## 2012-01-01 NOTE — Progress Notes (Signed)
Patient ID: Carl [name]  male  OZH:[id number]    DOB: 05-30-1956    DOA: 12/30/2011  PCP: Herb Grays, MD, MD  Subjective: Persistent left-sided weakness, complaining of left calf pain Overnight issues noted, telemetry strips reviewed, patient did have fast atrial flutter, although during the day, patient had sinus bradycardia with heart rate lowest in 30's yesterday   Objective: Weight change:   Intake/Output Summary (Last 24 hours) at 01/01/12 1056 Last data filed at 01/01/12 0818  Gross per 24 hour  Intake   1505 ml  Output      0 ml  Net   1505 ml   Blood pressure 136/83, pulse 46, temperature 97.7 F (36.5 C), temperature source Oral, resp. rate 18, height 5\' 9"  (1.753 m), weight 79.289 kg (174 lb 12.8 oz), SpO2 98.00%.  Physical Exam: General: Alert and awake, oriented x3, not in any acute distress. HEENT: anicteric sclera, pupils reactive to light and accommodation, EOMI CVS: S1-S2 clear, no murmur rubs or gallops Chest: clear to auscultation bilaterally, no wheezing, rales or rhonchi Abdomen: soft nontender, nondistended, normal bowel sounds, no organomegaly Extremities: no cyanosis, clubbing or edema noted bilaterally. Homans sign positive in left lower extremity  Neuro: Strength 5/5 in right upper and lower extremities, left upper and lower extremity 3/5  Lab Results: Basic Metabolic Panel:  Lab 12/31/11 6295 12/30/11 1850  NA -- 137  K -- 3.9  CL -- 104  CO2 -- 23  GLUCOSE -- 80  BUN -- 18  CREATININE 0.79 0.88  CALCIUM -- 9.4  MG -- --  PHOS -- --   Liver Function Tests:  Lab 12/30/11 1850  AST 37  ALT 31  ALKPHOS 58  BILITOT 0.3  PROT 7.5  ALBUMIN 4.1   CBC:  Lab 12/31/11 0705 12/30/11 1850  WBC 5.4 9.0  NEUTROABS -- 5.7  HGB 14.5 16.0  HCT 43.8 47.6  MCV 92.2 92.6  PLT 177 205   Cardiac Enzymes: No results found for this basename: CKTOTAL:3,CKMB:3,CKMBINDEX:3,TROPONINI:3 in the last 168 hours BNP: No components found with this  basename: POCBNP:2 CBG:  Lab 12/30/11 1816  GLUCAP 81     Micro Results: No results found for this or any previous visit (from the past 240 hour(s)).  Studies/Results: Dg Chest 2 View  12/30/2011  *RADIOLOGY REPORT*  Clinical Data: Numbness left-sided body.  Smoker.  CHEST - 2 VIEW  Comparison: 10/08/2009.  Findings: Chronic bronchitis type changes with peribronchial thickening.  Calcified mildly tortuous aorta.  No infiltrate, congestive heart failure or pneumothorax.  Prominent sclerosis anterior aspect first rib unchanged.  IMPRESSION: Calcified mildly tortuous aorta.  Peribronchial thickening suggestive of chronic bronchitis type changes unchanged.  Original Report Authenticated By: Fuller Canada, M.D.   Ct Head Wo Contrast  12/30/2011   IMPRESSION: Question right hemispheric infarct as noted above.  Called report to Dr. Freida Busman.  The patient is not under Dr. West Carbo care.  Presently searching for responsible clinician to give report [facility] CT technologist)  Original Report Authenticated By: Fuller Canada, M.D.   Mr Brain Wo Contrast  12/31/2011  *RADIOLOGY REPORT*  Clinical Data:  Stroke.  New onset of left-sided weakness.  MRI HEAD WITHOUT CONTRAST MRA HEAD WITHOUT CONTRAST   IMPRESSION:  1.  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 2.  No other remote infarcts or significant white matter disease. 3.  Chronic ethmoid and maxillary sinus  disease.    MRA HEAD  Findings:   IMPRESSION:  1.  Mild distal small vessel disease. 2.  No other significant proximal stenosis, aneurysm, or branch vessel occlusion.  Original Report Authenticated By: Jamesetta Orleans. MATTERN, M.D.    2-D echocardiogram 12/31/2011  - Left ventricle: The cavity size was normal. There was mild concentric hypertrophy. Systolic function was vigorous. The estimated ejection fraction was in the range of 65% to 70%. Wall  motion was normal; there were no regional wall motion abnormalities. - Left atrium: The atrium was moderately dilated. - Atrial septum: There was increased thickness of the septum, consistent with lipomatous hypertrophy.  Carotid Vascular Ultrasound 12/31/2011 Carotid Duplex (Doppler) has been completed.  No evidence of internal carotid artery stenosis bilaterally. Antegrade bilateral vertebral artery flow.   Medications: Scheduled Meds:    . atorvastatin  20 mg Oral q1800  . clopidogrel  75 mg Oral Q breakfast  . enoxaparin  40 mg Subcutaneous Q24H  . levothyroxine  75 mcg Oral Daily  . nicotine  14 mg Transdermal Daily  . pantoprazole  40 mg Oral Daily  . DISCONTD: aspirin  300 mg Rectal Daily  . DISCONTD: aspirin  325 mg Oral Daily  . DISCONTD: aspirin  325 mg Oral Daily  . DISCONTD: clopidogrel  75 mg Oral Q breakfast   Continuous Infusions:    . DISCONTD: sodium chloride 75 mL/hr at 01/01/12 6578     Assessment/Plan: Principal Problem:  *Acute right CVA (cerebral vascular accident) confirmed on MRI - await stroke service recommendations regarding anticoagulation, given new findings of fast atrial flutter/tachy-brady syndrome. Continue Plavix for now - PT OT evaluation done, no PT followup, outpatient PT recommended with rolling walker, tub shower seat  Atrial flutter with tachybrady syndrome  - Continue Plavix, 2-D echocardiogram done, EF 65-70%, cardiology consultation called  - With recent CVA, Is he a candidate for Coumadin/xarelto/pradaxa anticoagulation? Does he need pacemaker?  Left lower extremity pain: - Doppler ultrasound to rule out DVT ordered    Hypertension:stable    Dyslipidemia: LDL 58, continue statin   Hypothyroidism:  TSH 2.4, continue levothyroxin   DVT Prophylaxis: Lovenox  Disposition: not medically ready   LOS: 2 days   Cahlil Sattar M.D. [facility] 01/01/2012, 10:56 AM Pager: [phone]

## 2012-01-01 NOTE — Progress Notes (Signed)
*  PRELIMINARY RESULTS* Vascular Ultrasound Left lower extremity venous duplex has been completed.  Preliminary findings: Left= No evidence of DVT or baker's cyst.  Farrel Demark, RDMS 01/01/2012, 11:27 AM

## 2012-01-02 MED ORDER — NITROGLYCERIN 0.3 MG SL SUBL: 0.3000 mg | SUBLINGUAL_TABLET | SUBLINGUAL | Status: DC | PRN

## 2012-01-02 MED ORDER — CLOPIDOGREL BISULFATE 75 MG PO TABS: 75.0000 mg | ORAL_TABLET | Freq: Every day | ORAL | Status: DC

## 2012-01-02 MED ORDER — LISINOPRIL 10 MG PO TABS: 10.0000 mg | ORAL_TABLET | Freq: Every day | ORAL | Status: DC

## 2012-01-02 MED ORDER — UNABLE TO FIND: Status: DC

## 2012-01-02 MED ORDER — LISINOPRIL 10 MG PO TABS
10.0000 mg | ORAL_TABLET | Freq: Every day | ORAL | Status: DC
  Administered 2012-01-02: 10 mg via ORAL
  Filled 2012-01-02: qty 1

## 2012-01-02 NOTE — Progress Notes (Signed)
   CARE MANAGEMENT NOTE 01/02/2012  Patient:  Carl [name], Carl [name]   Account Number:  [id number]  Date Initiated:  01/01/2012  Documentation initiated by:  [facility]  Subjective/Objective Assessment:   ADmitted with CVA. Lives with spouse.     Action/Plan:   PT and OT evals- outpatient OT, possible outpatient PT, rolling walker   Anticipated DC Date:  01/02/2012   Anticipated DC Plan:  HOME/SELF CARE      DC Planning Services  CM consult      Choice offered to / List presented to:     DME arranged  Levan Hurst      DME agency  [facility]        Status of service:  Completed, signed off Medicare Important Message given?   (If response is "NO", the following Medicare IM given date fields will be blank) Date Medicare IM given:   Date Additional Medicare IM given:    Discharge Disposition:  HOME/SELF CARE  Per UR Regulation:  Reviewed for med. necessity/level of care/duration of stay  If discussed at Long Length of Stay Meetings, dates discussed:    Comments:  01/02/2012 0845 Contacted [facility] for RW for home. 3n1 per pt is not needed. Isidoro Donning RN CCM Case Mgmt phone [phone]  PCP Dr. Herb Grays  01/01/12 Spoke with patient about d/c plans. He has a tub bench but does not have a rolling walker which is being recommended by PT. He is agreeable to outpatient therapy at [facility]. if outpatient therapy ordered.  Jacquelynn Cree RN, BSN, CCM

## 2012-01-02 NOTE — Discharge Summary (Signed)
Physician Discharge Summary  Patient ID: Carl [name] MRN: [id number] DOB/AGE: Feb 11, 1956 56 y.o.  Admit date: 12/30/2011 Discharge date: 01/02/2012  Primary Care Physician:  Herb Grays, MD, MD  Discharge Diagnoses:    .CVA (cerebral vascular accident) Hypertension Hyperlipidemia GERD PSVT/ multiple PAC's. with episodes of bradycardia Hypothyroidism   Consults:  1 neurology, stroke service, Dr. Pearlean Brownie                     2. cardiology, [place], Dr. Eden Emms  Discharge Medications: Medication List  As of 01/02/2012  8:17 AM   STOP taking these medications         amLODipine 5 MG tablet         TAKE these medications         clopidogrel 75 MG tablet   Commonly known as: PLAVIX   Take 1 tablet (75 mg total) by mouth daily with breakfast.      esomeprazole 40 MG capsule   Commonly known as: NEXIUM   Take 40 mg by mouth daily before breakfast.      HYDROcodone-acetaminophen 7.5-500 MG per tablet   Commonly known as: LORTAB   Take 1 tablet by mouth 2 (two) times daily as needed. For pain      levothyroxine 75 MCG tablet   Commonly known as: SYNTHROID, LEVOTHROID   Take 75 mcg by mouth daily.      lisinopril 10 MG tablet   Commonly known as: PRINIVIL,ZESTRIL   Take 1 tablet (10 mg total) by mouth daily.      nitroGLYCERIN 0.3 MG SL tablet   Commonly known as: NITROSTAT   Place 1 tablet (0.3 mg total) under the tongue every 5 (five) minutes as needed for chest pain.      rosuvastatin 40 MG tablet   Commonly known as: CRESTOR   Take 40 mg by mouth daily.      UNABLE TO FIND   OUT PATIENT PHYSICAL THERAPY for rehab    Diagnosis: Acute Stroke             Brief H and P: For complete details please refer to admission H and P, but in brief patient is a 56 year old male with history of hypertension hyperlipidemia hypothyroidism who woke up on the morning of admission with left-sided numbness which persisted all day. He also had some guarding in the left lower  extremity. He denied any swallowing difficulty. He finally decided to come to the ER at 7 PM. Patient was admitted for stroke workup with a positive finding on the CT head.  Hospital Course:    *Acute right CVA (cerebral vascular accident) confirmed on MRI: Patient was admitted to hospitalist service. CT scan of the head was done in the ED which showed possibility of nonhemorrhagic stroke. MRI of the brain confirmed acute nonhemorrhagic infarct involving posterior right corona radiate and extending into the posterior limb of the right internal capsule. Patient had been on aspirin 81 mg daily hence he was started on Plavix. Patient was not a TPA candidate secondary to delay in arrival. Stroke service was consulted and followed the patient through the hospitalization. He had complete stroke workup including carotid Dopplers, 2-D echocardiogram and MRA. PT OT evaluation was done which did not recommend any home physical therapy however recommended outpatient physical therapy for rehabilitation and patient was provided with rolling walker and tub shower seat . PSVT/ multiple PAC's. with episodes of bradycardia: During hospitalization patient was noticed to have significant episodes of  bradycardia with heart rate in 30s to 40s, asymptomatic however on the night of 12/31/2011 patient had paroxysmal supraventricular tachycardia with multiple PACs. Cardiology was consulted with possibility of atrial flutter with tachybradycardia syndrome, patient was evaluated by Dr. Eden Emms from [facility] cardiology. Per cardiology patient did not have atrial flutter, there is no indication of any pacemaker at this time or Coumadin anticoagulation. Patient also had a 2-D echocardiogram as part of stroke workup which showed EF of 65-70%. Patient also had a cardiac catheterization done in 2011 by Dr. Eldridge Dace which showed no fixed epicardial disease. Patient was recommended to followup with Dr. Eldridge Dace outpatient if he has any chest  pain or cardiac issues.  Left lower extremity pain: Doppler ultrasound was done which showed no DVT   Hypertension: Norvasc was discontinued due to his episodes of bradycardia. Patient was started on lisinopril for hypertension.   Dyslipidemia: LDL 58, continue statin  Hypothyroidism: TSH 2.4, continue levothyroxin    Day of Discharge BP 172/93  Pulse 41  Temp(Src) 97.8 F (36.6 C) (Oral)  Resp 18  Ht 5\' 9"  (1.753 m)  Wt 79.289 kg (174 lb 12.8 oz)  BMI 25.81 kg/m2  SpO2 100%  Physical Exam: General: Alert and awake oriented x3 not in any acute distress. HEENT: anicteric sclera, pupils reactive to light and accommodation CVS: S1-S2 clear no murmur rubs or gallops Chest: clear to auscultation bilaterally, no wheezing rales or rhonchi Abdomen: soft nontender, nondistended, normal bowel sounds, no organomegaly Extremities: no cyanosis, clubbing or edema noted bilaterally    The results of significant diagnostics from this hospitalization (including imaging, microbiology, ancillary and laboratory) are listed below for reference.    LAB RESULTS: Basic Metabolic Panel:  Lab 12/31/11 1610 12/30/11 1850  NA -- 137  K -- 3.9  CL -- 104  CO2 -- 23  GLUCOSE -- 80  BUN -- 18  CREATININE 0.79 0.88  CALCIUM -- 9.4  MG -- --  PHOS -- --   Liver Function Tests:  Lab 12/30/11 1850  AST 37  ALT 31  ALKPHOS 58  BILITOT 0.3  PROT 7.5  ALBUMIN 4.1   CBC:  Lab 12/31/11 0705 12/30/11 1850  WBC 5.4 9.0  NEUTROABS -- 5.7  HGB 14.5 16.0  HCT 43.8 47.6  MCV 92.2 --  PLT 177 205   Cardiac Enzymes:  Lab 01/01/12 2349 01/01/12 1744  CKTOTAL 112 125  CKMB 1.9 2.1  CKMBINDEX -- --  TROPONINI <0.30 <0.30   BNP: No components found with this basename: POCBNP:2 CBG:  Lab 12/30/11 1816  GLUCAP 81    Significant Diagnostic Studies:  Dg Chest 2 View  12/30/2011  *RADIOLOGY REPORT*  Clinical Data: Numbness left-sided body.  Smoker.  CHEST - 2 VIEW  Comparison:  10/08/2009.  Findings: Chronic bronchitis type changes with peribronchial thickening.  Calcified mildly tortuous aorta.  No infiltrate, congestive heart failure or pneumothorax.  Prominent sclerosis anterior aspect first rib unchanged.  IMPRESSION: Calcified mildly tortuous aorta.  Peribronchial thickening suggestive of chronic bronchitis type changes unchanged.  Original Report Authenticated By: Fuller Canada, M.D.   Ct Head Wo Contrast  12/30/2011  *RADIOLOGY REPORT*  Clinical Data: Onset of left extremity weakness  CT HEAD WITHOUT CONTRAST  Technique:  Contiguous axial images were obtained from the base of the skull through the vertex without contrast.  Comparison: None.  Findings: No intracranial hemorrhage.  Question subtle infarct mid to right corona radiata/posteriorly right internal capsule.  No hydrocephalus. No intracranial mass lesion  detected on this unenhanced exam.  Mega cisterna magna incidentally noted.  IMPRESSION: Question right hemispheric infarct as noted above.  Called report to Dr. Freida Busman.  The patient is not under Dr. West Carbo care.  Presently searching for responsible clinician to give report [facility] CT technologist)  Original Report Authenticated By: Fuller Canada, M.D.   Mr Brain Wo Contrast  12/31/2011  *RADIOLOGY REPORT*  Clinical Data:  Stroke.  New onset of left-sided weakness.  MRI HEAD WITHOUT CONTRAST MRA HEAD WITHOUT CONTRAST  Technique:  Multiplanar, multiecho pulse sequences of the brain and surrounding structures were obtained without intravenous contrast. Angiographic images of the head were obtained using MRA technique without contrast.  Comparison:  CT head without contrast 12/30/2011.  MRI HEAD  Findings:  A focal area of restricted diffusion is evident in the posterior right corona radiata with some signal extending into the posterior limb of the right internal capsule.  T2 and FLAIR hyperintensities associated with this lesion, compatible with the given time frame.  No other significant white matter disease is present.  There are remote infarcts.  No hemorrhage or mass lesion is evident.  Flow is present in the major intracranial arteries.  The globes and orbits are intact.  Mild mucosal thickening is present throughout the ethmoid air cells.  The maxillary sinuses are shrunken bilaterally.  Frontal sinuses and sphenoid sinuses are clear.  The mastoid air cells are clear.  IMPRESSION:  1.  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 2.  No other remote infarcts or significant white matter disease. 3.  Chronic ethmoid and maxillary sinus disease.  MRA HEAD  Findings: The internal carotid arteries are within normal limits bilaterally.  The A1 and M1 segments are normal.  No anterior communicating artery is evident.  There is some attenuation of distal MCA branch vessels.  No significant proximal stenosis or occlusion is present.  There is some attenuation of distal ACA branch vessels.  The right vertebral artery is slightly dominant to the left.  The PICA origins are visualized bilaterally.  The basilar artery is within normal limits.  Both posterior cerebral arteries originate from the basilar tip.  There is some attenuation of distal PCA branch vessels.  The study is mildly degraded by patient motion.  IMPRESSION:  1.  Mild distal small vessel disease. 2.  No other significant proximal stenosis, aneurysm, or branch vessel occlusion.  Original Report Authenticated By: Jamesetta Orleans. MATTERN, M.D.   Mr Maxine Glenn Head/brain Wo Cm  12/31/2011  *RADIOLOGY REPORT*  Clinical Data:  Stroke.  New onset of left-sided weakness.  MRI HEAD WITHOUT CONTRAST MRA HEAD WITHOUT CONTRAST  Technique:  Multiplanar, multiecho pulse sequences of the brain and surrounding structures were obtained without intravenous contrast. Angiographic images of the head were obtained using MRA technique without contrast.   Comparison:  CT head without contrast 12/30/2011.  MRI HEAD  Findings:  A focal area of restricted diffusion is evident in the posterior right corona radiata with some signal extending into the posterior limb of the right internal capsule.  T2 and FLAIR hyperintensities associated with this lesion, compatible with the given time frame. No other significant white matter disease is present.  There are remote infarcts.  No hemorrhage or mass lesion is evident.  Flow is present in the major intracranial arteries.  The globes and orbits are intact.  Mild mucosal thickening is present throughout the ethmoid air cells.  The maxillary sinuses are shrunken bilaterally.  Frontal sinuses and sphenoid sinuses are clear.  The mastoid air cells are clear.  IMPRESSION:  1.  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 2.  No other remote infarcts or significant white matter disease. 3.  Chronic ethmoid and maxillary sinus disease.  MRA HEAD  Findings: The internal carotid arteries are within normal limits bilaterally.  The A1 and M1 segments are normal.  No anterior communicating artery is evident.  There is some attenuation of distal MCA branch vessels.  No significant proximal stenosis or occlusion is present.  There is some attenuation of distal ACA branch vessels.  The right vertebral artery is slightly dominant to the left.  The PICA origins are visualized bilaterally.  The basilar artery is within normal limits.  Both posterior cerebral arteries originate from the basilar tip.  There is some attenuation of distal PCA branch vessels.  The study is mildly degraded by patient motion.  IMPRESSION:  1.  Mild distal small vessel disease. 2.  No other significant proximal stenosis, aneurysm, or branch vessel occlusion.  Original Report Authenticated By: Jamesetta Orleans. MATTERN, M.D.     Disposition and Follow-up: Discharge Orders     Future Orders Please Complete By Expires   Diet - low sodium heart healthy      Increase activity slowly          DISPOSITION: Home  DIET: Heart healthy diet  ACTIVITY: Outpatient PT  DISCHARGE FOLLOW-UP Follow-up Information    Follow up with Herb Grays, MD. Schedule an appointment as soon as possible for a visit in 10 days. (for hospital follow-up)       Follow up with Gates Rigg, MD. Schedule an appointment as soon as possible for a visit in 6 weeks. (for stroke follow-up)    Contact information:   [address] [facility] [place] [address] [phone]          Time spent on Discharge: 45 minutes Signed:  Twylia Oka M.D. [facility] 01/02/2012, 8:17 AM

## 2012-01-02 NOTE — Progress Notes (Signed)
History: Carl [name] is an 56 y.o. male gentleman who woke up with the left-sided numbness 12/30/2011. This persisted all day. He finally decided to come to the ER at 7 PM and was dragging in his left lower extremity. He denies any swallowing difficulty. He states is compliant with medication. He does have a history of hypertension. He denies any altered mentation, headaches, nausea, vomiting, palpitations. He continues to have left hand, tricep, and shoulder abduction weakness. He states he has been on ASA 81 mg daily. 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 Patient was not a TPA candidate secondary to delary in arrival. He was admitted for further evaluation and treatment.  Subjective: I am going home today.  Feel fine.  Minimal weakness in fingers of left hand and left leg.    Objective: BP 172/93  Pulse 41  Temp(Src) 97.8 F (36.6 C) (Oral)  Resp 18  Ht 5\' 9"  (1.753 m)  Wt 79.289 kg (174 lb 12.8 oz)  BMI 25.81 kg/m2  SpO2 100%  CBGs  Basename 12/30/11 1816  GLUCAP 81    Diet: heart healthy  Activity: as tolerated  DVT Prophylaxis: lovenox  Medications: Scheduled:   . atorvastatin  20 mg Oral q1800  . clopidogrel  75 mg Oral Q breakfast  . enoxaparin  40 mg Subcutaneous Q24H  . levothyroxine  75 mcg Oral Daily  . lisinopril  10 mg Oral Daily  . nicotine  14 mg Transdermal Daily  . pantoprazole  40 mg Oral Daily    Neurologic Exam: Mental Status: Alert, oriented, thought content appropriate.  Speech fluent without evidence of aphasia. Able to follow 3 step commands without difficulty. Cranial Nerves: II- Visual fields grossly intact. III/IV/VI-Extraocular movements intact.  Pupils reactive bilaterally. V/VII-Smile symmetric VIII-hearing grossly intact IX/X-normal gag XI-bilateral shoulder shrug XII-midline tongue extension Motor: 5/5 bilaterally with  normal tone and bulk Sensory: Light touch intact throughout, bilaterally Deep Tendon Reflexes: 2+ and symmetric throughout Plantars: Downgoing bilaterally Cerebellar: Normal finger-to-nose, slightly slowed fine finger movements left hand and normal heel-to-shin test.   Lab Results: Basic Metabolic Panel:  Lab 12/31/11 3244 12/30/11 1850  NA -- 137  K -- 3.9  CL -- 104  CO2 -- 23  GLUCOSE -- 80  BUN -- 18  CREATININE 0.79 0.88  CALCIUM -- 9.4  MG -- --  PHOS -- --   Liver Function Tests:  Lab 12/30/11 1850  AST 37  ALT 31  ALKPHOS 58  BILITOT 0.3  PROT 7.5  ALBUMIN 4.1   CBC:  Lab 12/31/11 0705 12/30/11 1850  WBC 5.4 9.0  NEUTROABS -- 5.7  HGB 14.5 16.0  HCT 43.8 47.6  MCV 92.2 92.6  PLT 177 205   Cardiac Enzymes:  Lab 01/01/12 2349 01/01/12 1744 01/01/12 1223  CKTOTAL 112 125 139  CKMB 1.9 2.1 2.1  CKMBINDEX -- -- --  TROPONINI <0.30 <0.30 <0.30   D-Dimer:  Lab 01/01/12 1223  DDIMER 0.26   CBG:  Lab 12/30/11 1816  GLUCAP 81   Hemoglobin A1C:  Lab 12/31/11 1304  HGBA1C 5.6   Fasting Lipid Panel:  Lab 12/31/11 0705  CHOL 154  HDL 71  LDLCALC 58  TRIG 126  CHOLHDL 2.2  LDLDIRECT --   Thyroid Function Tests:  Lab 12/31/11 1304  TSH 2.483  T4TOTAL --  FREET4 1.04  T3FREE --  THYROIDAB --   Coagulation:  Lab 12/30/11 1850  LABPROT 12.8  INR 0.94   Study Results:  12/31/2011 MRI HEAD  Findings:  A focal area of restricted diffusion is evident in the posterior right corona radiata with some signal extending into the posterior limb of the right internal capsule.  T2 and FLAIR hyperintensities associated with this lesion, compatible with the given time frame. No other significant white matter disease is present.  There are remote infarcts.  No hemorrhage or mass lesion is evident.  Flow is present in the major intracranial arteries.  The globes and orbits are intact.  Mild mucosal thickening is present throughout the ethmoid air cells.   The maxillary sinuses are shrunken bilaterally.  Frontal sinuses and sphenoid sinuses are clear.  The mastoid air cells are clear.  IMPRESSION:  1.  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 2.  No other remote infarcts or significant white matter disease. 3.  Chronic ethmoid and maxillary sinus disease.    12/31/2011 MRA HEAD  Findings: The internal carotid arteries are within normal limits bilaterally.  The A1 and M1 segments are normal.  No anterior communicating artery is evident.  There is some attenuation of distal MCA branch vessels.  No significant proximal stenosis or occlusion is present.  There is some attenuation of distal ACA branch vessels.  The right vertebral artery is slightly dominant to the left.  The PICA origins are visualized bilaterally.  The basilar artery is within normal limits.  Both posterior cerebral arteries originate from the basilar tip.  There is some attenuation of distal PCA branch vessels.  The study is mildly degraded by patient motion.  IMPRESSION:  1.  Mild distal small vessel disease. 2.  No other significant proximal stenosis, aneurysm, or branch vessel occlusion. Carl [name], M.D.   Vascular Ultrasound  Left lower extremity venous duplex has been completed. Preliminary findings: Left= No evidence of DVT or baker's cyst.  Therapies: PT:Pt progressing well although still requires assist for balance and L weakness. Follow Up Recommendations: Outpatient PT;Supervision/Assistance - 24 hour  01/01/12 Cards consult: CVA: Internal capsule not hemispheral Agree with ASA and Plavix. Stop smoking and Rx with statin  Bradycardia: Asymptomatic no evidence of ischemia. Avoid AV nodal blocking drugs. No indication for pacer  PAT: Do not think he had flutter. Continue telemetry.  Chest Pain: R/O ECG nonischemic, Echo normal and no significant disease on cath 2011 Dr.  Eden [name]  Assessment/Plan: [name] is a 56 y.o. male with a posterior right corona radiata and extending in the posterior limb of the right internal capsule, secondary to possible atrial fibrillation/flutter. On no antiplatelets prior to admission. Now on clopidogrel 75 mg orally every day for secondary stroke prevention. -hypertension  -hyperlipidemia   TREATMENT/PLAN  -Continue clopidogrel 75 mg orally every day for secondary stroke prevention.  -OP OT/PT recommended - Aggressive BP control as outpatient. 6 week follow up with Dr. Pearlean Brownie.   LOS: 3 days   Jana Hakim [facility] [phone] 01/02/2012  10:12 AM  I have seen and examined this patient and agree with the plan and examined as detailed above.  Kipp Laurence, MD [facility] Stroke Team [phone] 01/02/2012 10:35 AM

## 2012-01-12 DIAGNOSIS — I479 Paroxysmal tachycardia, unspecified: Secondary | ICD-10-CM | POA: Insufficient documentation

## 2012-01-14 ENCOUNTER — Ambulatory Visit: Payer: No Typology Code available for payment source | Attending: Internal Medicine | Admitting: Physical Therapy

## 2012-01-14 ENCOUNTER — Ambulatory Visit: Payer: No Typology Code available for payment source | Admitting: Physical Therapy

## 2012-01-14 DIAGNOSIS — I69998 Other sequelae following unspecified cerebrovascular disease: Secondary | ICD-10-CM | POA: Insufficient documentation

## 2012-01-14 DIAGNOSIS — R269 Unspecified abnormalities of gait and mobility: Secondary | ICD-10-CM | POA: Insufficient documentation

## 2012-01-14 DIAGNOSIS — R5381 Other malaise: Secondary | ICD-10-CM | POA: Insufficient documentation

## 2012-01-14 DIAGNOSIS — R279 Unspecified lack of coordination: Secondary | ICD-10-CM | POA: Insufficient documentation

## 2012-01-14 DIAGNOSIS — Z5189 Encounter for other specified aftercare: Secondary | ICD-10-CM | POA: Insufficient documentation

## 2012-01-14 DIAGNOSIS — M6281 Muscle weakness (generalized): Secondary | ICD-10-CM | POA: Insufficient documentation

## 2012-01-19 ENCOUNTER — Ambulatory Visit: Payer: No Typology Code available for payment source | Admitting: Occupational Therapy

## 2012-01-19 ENCOUNTER — Ambulatory Visit: Payer: No Typology Code available for payment source | Admitting: Physical Therapy

## 2012-01-22 ENCOUNTER — Ambulatory Visit: Payer: No Typology Code available for payment source | Admitting: Occupational Therapy

## 2012-01-22 ENCOUNTER — Ambulatory Visit: Payer: No Typology Code available for payment source | Attending: Internal Medicine | Admitting: Physical Therapy

## 2012-01-22 DIAGNOSIS — I69998 Other sequelae following unspecified cerebrovascular disease: Secondary | ICD-10-CM | POA: Insufficient documentation

## 2012-01-22 DIAGNOSIS — M6281 Muscle weakness (generalized): Secondary | ICD-10-CM | POA: Insufficient documentation

## 2012-01-22 DIAGNOSIS — R269 Unspecified abnormalities of gait and mobility: Secondary | ICD-10-CM | POA: Insufficient documentation

## 2012-01-22 DIAGNOSIS — Z5189 Encounter for other specified aftercare: Secondary | ICD-10-CM | POA: Insufficient documentation

## 2012-01-22 DIAGNOSIS — R279 Unspecified lack of coordination: Secondary | ICD-10-CM | POA: Insufficient documentation

## 2012-01-22 DIAGNOSIS — R5381 Other malaise: Secondary | ICD-10-CM | POA: Insufficient documentation

## 2012-01-26 ENCOUNTER — Ambulatory Visit: Payer: No Typology Code available for payment source | Admitting: Occupational Therapy

## 2012-01-26 ENCOUNTER — Ambulatory Visit: Payer: No Typology Code available for payment source | Admitting: Physical Therapy

## 2012-01-28 ENCOUNTER — Ambulatory Visit: Payer: No Typology Code available for payment source | Admitting: Physical Therapy

## 2012-01-28 ENCOUNTER — Ambulatory Visit: Payer: No Typology Code available for payment source | Admitting: Occupational Therapy

## 2012-02-02 ENCOUNTER — Ambulatory Visit: Payer: No Typology Code available for payment source | Admitting: Occupational Therapy

## 2012-02-02 ENCOUNTER — Ambulatory Visit: Payer: No Typology Code available for payment source | Admitting: Physical Therapy

## 2012-02-04 ENCOUNTER — Ambulatory Visit: Payer: No Typology Code available for payment source | Admitting: Occupational Therapy

## 2012-02-04 ENCOUNTER — Ambulatory Visit: Payer: No Typology Code available for payment source | Admitting: Physical Therapy

## 2012-02-08 ENCOUNTER — Ambulatory Visit: Payer: No Typology Code available for payment source | Admitting: Physical Therapy

## 2012-02-08 ENCOUNTER — Encounter: Payer: No Typology Code available for payment source | Admitting: Occupational Therapy

## 2012-02-10 ENCOUNTER — Encounter: Payer: No Typology Code available for payment source | Admitting: Occupational Therapy

## 2012-02-10 ENCOUNTER — Ambulatory Visit: Payer: No Typology Code available for payment source | Admitting: Physical Therapy

## 2012-04-27 ENCOUNTER — Emergency Department (HOSPITAL_COMMUNITY): Payer: Self-pay

## 2012-04-27 ENCOUNTER — Emergency Department (HOSPITAL_COMMUNITY)
Admission: EM | Admit: 2012-04-27 | Discharge: 2012-04-27 | Disposition: A | Discharge status: Home / Self Care | Payer: Self-pay | Attending: Emergency Medicine | Admitting: Emergency Medicine

## 2012-04-27 ENCOUNTER — Encounter (HOSPITAL_COMMUNITY): Payer: Self-pay

## 2012-04-27 DIAGNOSIS — L03032 Cellulitis of left toe: Secondary | ICD-10-CM

## 2012-04-27 DIAGNOSIS — S93401A Sprain of unspecified ligament of right ankle, initial encounter: Secondary | ICD-10-CM

## 2012-04-27 DIAGNOSIS — W19XXXA Unspecified fall, initial encounter: Secondary | ICD-10-CM

## 2012-04-27 DIAGNOSIS — W1789XA Other fall from one level to another, initial encounter: Secondary | ICD-10-CM | POA: Insufficient documentation

## 2012-04-27 DIAGNOSIS — B353 Tinea pedis: Secondary | ICD-10-CM | POA: Insufficient documentation

## 2012-04-27 DIAGNOSIS — S93402A Sprain of unspecified ligament of left ankle, initial encounter: Secondary | ICD-10-CM

## 2012-04-27 DIAGNOSIS — S93409A Sprain of unspecified ligament of unspecified ankle, initial encounter: Secondary | ICD-10-CM | POA: Insufficient documentation

## 2012-04-27 MED ORDER — CEPHALEXIN 500 MG PO CAPS: 500.0000 mg | ORAL_CAPSULE | Freq: Four times a day (QID) | ORAL | Status: AC

## 2012-04-27 MED ORDER — CEPHALEXIN 250 MG PO CAPS
500.0000 mg | ORAL_CAPSULE | Freq: Once | ORAL | Status: AC
  Administered 2012-04-27: 500 mg via ORAL
  Filled 2012-04-27: qty 2

## 2012-04-27 NOTE — ED Provider Notes (Signed)
History  This chart was scribed for Dione Booze, MD by Shari Heritage. The patient was seen in room TR05C/TR05C. Patient's care was started at 1240.     CSN: [id number]  Arrival date & time 04/27/12  1240   First MD Initiated Contact with Patient 04/27/12 1334      Chief Complaint  Patient presents with  . Leg Swelling     The history is provided by the patient. No language interpreter was used.   Carl [name] is a 56 y.o. male who presents to the Emergency Department complaining of bilateral ankle pain with associated swelling to both ankles onset 1 week ago. Patient rates the pain as 8/10. Patient says that symptoms began after he fell off his deck last Wednesday. He is ambulatory. Patient says that he has been feeling fatigued and a little more weak than usual.  Patient is also requesting staple removal from a laceration on his forehead. Patient says that the sutures were placed last Tuesday at [facility] on [facility]. He said that the laceration occcurred after a ladder fell on his   He has a surgical history HTN, hyperlipidemia, hypothyroid and stroke. Patient states that he had some residual weakness after the stroke. Patient denies history of diabetes.  PCP - Herb Grays Neurologist - Pearlean Brownie  Past Medical History  Diagnosis Date  . Hypertension   . Hyperlipemia   . Hypothyroid     History reviewed. No pertinent past surgical history.  Family History  Problem Relation Age of Onset  . Coronary artery disease      History  Substance Use Topics  . Smoking status: Current Everyday Smoker -- 1.0 packs/day  . Smokeless tobacco: Not on file  . Alcohol Use: Yes      Review of Systems  All other systems reviewed and are negative.    Allergies  Review of patient's allergies indicates no known allergies.  Home Medications   Current Outpatient Rx  Name Route Sig Dispense Refill  . ASPIRIN EC 81 MG PO TBEC Oral Take 81 mg by mouth daily.    Marland Kitchen  CLOPIDOGREL BISULFATE 75 MG PO TABS Oral Take 1 tablet (75 mg total) by mouth daily with breakfast. 60 tablet 3  . CYCLOBENZAPRINE HCL 10 MG PO TABS Oral Take 10 mg by mouth 3 (three) times daily as needed. For spasms    . FENOFIBRATE 54 MG PO TABS Oral Take 54 mg by mouth daily.    Marland Kitchen HYDROCODONE-ACETAMINOPHEN 10-325 MG PO TABS Oral Take 1 tablet by mouth every 6 (six) hours as needed. For pain    . IBUPROFEN 800 MG PO TABS Oral Take 800 mg by mouth every 8 (eight) hours as needed. For pain    . LEVOTHYROXINE SODIUM 100 MCG PO TABS Oral Take 100 mcg by mouth daily.    Marland Kitchen LISINOPRIL 5 MG PO TABS Oral Take 5 mg by mouth daily.    Marland Kitchen NITROGLYCERIN 0.2 MG/HR TD PT24 Transdermal Place 1 patch onto the skin daily.    Marland Kitchen NITROGLYCERIN 0.3 MG SL SUBL Sublingual Place 1 tablet (0.3 mg total) under the tongue every 5 (five) minutes as needed for chest pain. 60 tablet 0  . PANTOPRAZOLE SODIUM 40 MG PO TBEC Oral Take 40 mg by mouth daily.    Marland Kitchen ROSUVASTATIN CALCIUM 40 MG PO TABS Oral Take 40 mg by mouth daily.      BP 139/92  Pulse 75  Temp 98.1 F (36.7 C) (Oral)  Resp  18  SpO2 98%  Physical Exam  Constitutional: He is oriented to person, place, and time. He appears well-developed and well-nourished.  HENT:  Head: Head is with laceration.       3 cm laceration right forehead. Sutures in place. Healing well without signs of infection.  Eyes:       Fundi normal.  Musculoskeletal:       Swelling of the lateral aspect of both ankles. Tenderness over the lateral aspects of both. Right ankle has point tenderness over distal fibula. Left ankle has tenderness over the fibulotalar ligament. No instability. No tenderness over the 5th metatarsal.  Left foot has changes of tinea pedis in the intertriginous area between the 3rd and 4th toes. Mild erythema and swelling of the left 4th toe.   Neurological: He is alert and oriented to person, place, and time.       Mild left hemiparesis.   Psychiatric: He has a  normal mood and affect. His behavior is normal.    ED Course  Procedures (including critical care time) DIAGNOSTIC STUDIES: Oxygen Saturation is 98% on room air, normal by my interpretation.    COORDINATION OF CARE: 1:34pm- Patient informed of current plan for treatment and evaluation and agrees with plan at this time. Performed suture removal from laceration on forehead without difficulty. Will order X-rays of both ankles.    Dg Ankle Complete Left  04/27/2012  *RADIOLOGY REPORT*  Clinical Data: Pain and swelling post fall  LEFT ANKLE COMPLETE - 3+ VIEW  Comparison: None.  Findings: Three views of the left ankle submitted.  No acute fracture or subluxation.  The ankle mortise is preserved.  Mild soft tissue swelling adjacent to lateral malleolus.  Small plantar and posterior spur of the calcaneus.  IMPRESSION: No acute fracture or subluxation.  Small plantar and posterior spur of the calcaneus.  Mild soft tissue swelling adjacent to lateral malleolus.  Original Report Authenticated By: Natasha Mead, M.D.   Dg Ankle Complete Right  04/27/2012  *RADIOLOGY REPORT*  Clinical Data: Fall, pain  RIGHT ANKLE - COMPLETE 3+ VIEW  Comparison: None.  Findings: Three views of the right ankle submitted.  No acute fracture or subluxation.  Ankle mortise is preserved.  Mild soft tissue swelling adjacent to lateral malleolus.  Small plantar spur of the calcaneus.  IMPRESSION: No acute fracture or subluxation.  Plantar spur of the calcaneus. Soft tissue swelling adjacent to lateral malleolus.  Original Report Authenticated By: Natasha Mead, M.D.     1. Fall   2. Sprain of ankle, right   3. Sprain of ankle, left   4. Tinea pedis   5. Cellulitis of fourth toe of left foot       MDM  Bilateral ankle sprain-x-ray will need to be done to rule out fracture. Cellulitis of the left fourth toe secondary to break in the skin from tinea pedis. 4. Wound is healing well and stitches are removed without difficulty.  X-ray  shows no evidence of fracture. You will be placed in ankle splint orthotic is. Prescription is given for cephalexin. He's advised to use over-the-counter antifungal agents for his tinea pedis. Follow up with PCP next week.      I personally performed the services described in this documentation, which was scribed in my presence. The recorded information has been reviewed and considered.     Dione Booze, MD 04/27/12 2035

## 2012-04-27 NOTE — ED Notes (Signed)
Pt reports bilateral ankle swelling starting Wednesday after falling off his deck. Pt also requesting to have staples removed from forehead, reports they were placed last Tuesday at [facility] on [address]

## 2012-04-27 NOTE — ED Notes (Signed)
Patient transported to X-ray 

## 2012-04-27 NOTE — ED Notes (Signed)
Pt reports taking 2 Hydrocodone approx 1100 this am, swelling noted to (L) ankle

## 2012-04-27 NOTE — Progress Notes (Signed)
Orthopedic Tech Progress Note Patient Details:  Carl [name] [birth year] [id number]  Ortho Devices Type of Ortho Device: ASO;Crutches Ortho Device/Splint Location: ASO x2 Ortho Device/Splint Interventions: Application   Cammer, Mickie Bail 04/27/2012, 2:54 PM

## 2012-11-11 ENCOUNTER — Encounter (INDEPENDENT_AMBULATORY_CARE_PROVIDER_SITE_OTHER): Payer: Self-pay | Admitting: General Surgery

## 2012-11-11 ENCOUNTER — Ambulatory Visit (INDEPENDENT_AMBULATORY_CARE_PROVIDER_SITE_OTHER): Payer: BC Managed Care – PPO | Admitting: General Surgery

## 2012-11-11 ENCOUNTER — Telehealth (INDEPENDENT_AMBULATORY_CARE_PROVIDER_SITE_OTHER): Payer: Self-pay

## 2012-11-11 ENCOUNTER — Encounter (INDEPENDENT_AMBULATORY_CARE_PROVIDER_SITE_OTHER): Payer: Self-pay

## 2012-11-11 VITALS — BP 120/84 | HR 68 | Temp 97.1°F | Resp 20 | Ht 69.0 in | Wt 185.0 lb

## 2012-11-11 DIAGNOSIS — K4090 Unilateral inguinal hernia, without obstruction or gangrene, not specified as recurrent: Secondary | ICD-10-CM

## 2012-11-11 NOTE — Telephone Encounter (Signed)
Cardiac clearance faxed to Dr. Eldridge Dace @ [phone].  Fax confirmation rec'd

## 2012-11-11 NOTE — Progress Notes (Signed)
Patient ID: Carl [name], male   DOB: 03/18/1956, 57 y.o.   MRN: [id number]  Chief Complaint  Patient presents with  . New Evaluation    eval hernia    HPI Carl [name] is a 57 y.o. male.  This patient is referred by Dr. Collins Scotland for evaluation of a bulge in the right groin and possible inguinal hernia. He said that he first noticed this on Christmas day as a "knot" in his right groin. He says that initially it did not cause any discomfort but it has been fluctuating in size and is more pronounced with walking or standing. He also notices burning and tearing discomfort in the area mainly with walking or prolonged standing and he does get some relief when lying flat and with rest. He says that his bowels are normal in denies any peptic symptoms such as nausea or vomiting. He denies any prior history of colonoscopy. He did have a stroke in April of 2013, and is have some cardiac blockages and coronary artery disease and remains on Plavix and aspirin for this. He last saw his cardiologist about 6 months ago. HPI  Past Medical History  Diagnosis Date  . Hypertension   . Hyperlipemia   . Hypothyroid   . CAD (coronary artery disease)   . Stroke   . Arthritis     Past Surgical History  Procedure Laterality Date  . Hernia repair      Family History  Problem Relation Age of Onset  . Coronary artery disease      Social History History  Substance Use Topics  . Smoking status: Current Every Day Smoker -- 1.00 packs/day  . Smokeless tobacco: Not on file  . Alcohol Use: Yes    No Known Allergies  Current Outpatient Prescriptions  Medication Sig Dispense Refill  . aspirin EC 81 MG tablet Take 81 mg by mouth daily.      . clopidogrel (PLAVIX) 75 MG tablet Take 1 tablet (75 mg total) by mouth daily with breakfast.  60 tablet  3  . cyclobenzaprine (FLEXERIL) 10 MG tablet Take 10 mg by mouth 3 (three) times daily as needed. For spasms      . fenofibrate 54 MG tablet Take 54 mg by mouth  daily.      Marland Kitchen HYDROcodone-acetaminophen (NORCO) 10-325 MG per tablet Take 1 tablet by mouth every 6 (six) hours as needed. For pain      . ibuprofen (ADVIL,MOTRIN) 800 MG tablet Take 800 mg by mouth every 8 (eight) hours as needed. For pain      . levothyroxine (SYNTHROID, LEVOTHROID) 100 MCG tablet Take 100 mcg by mouth daily.      Marland Kitchen lisinopril (PRINIVIL,ZESTRIL) 5 MG tablet Take 5 mg by mouth daily.      . nitroGLYCERIN (NITRODUR - DOSED IN MG/24 HR) 0.2 mg/hr Place 1 patch onto the skin daily.      . nitroGLYCERIN (NITROSTAT) 0.3 MG SL tablet Place 1 tablet (0.3 mg total) under the tongue every 5 (five) minutes as needed for chest pain.  60 tablet  0  . pantoprazole (PROTONIX) 40 MG tablet Take 40 mg by mouth daily.      . rosuvastatin (CRESTOR) 40 MG tablet Take 40 mg by mouth daily.       No current facility-administered medications for this visit.    Review of Systems Review of Systems All other review of systems negative or noncontributory except as stated in the HPI  Blood pressure 120/84,  pulse 68, temperature 97.1 F (36.2 C), resp. rate 20, height 5\' 9"  (1.753 m), weight 185 lb (83.915 kg).  Physical Exam Physical Exam Physical Exam  Vitals reviewed. Constitutional: He is oriented to person, place, and time. He appears well-developed and well-nourished. No distress.  HENT:  Head: Normocephalic and atraumatic.  Mouth/Throat: No oropharyngeal exudate.  Eyes: Conjunctivae and EOM are normal. Pupils are equal, round, and reactive to light. Right eye exhibits no discharge. Left eye exhibits no discharge. No scleral icterus.  Neck: Normal range of motion. No tracheal deviation present.  Cardiovascular: Normal rate, regular rhythm and normal heart sounds.   Pulmonary/Chest: Effort normal and breath sounds normal. No stridor. No respiratory distress. He has no wheezes. He has no rales. He exhibits no tenderness.  Abdominal: Soft. Bowel sounds are normal. He exhibits no distension  and no mass. There is no tenderness. There is no rebound and no guarding. small and reducible RIH, no LIH identified. Musculoskeletal: Normal range of motion. He exhibits no edema and no tenderness.  Neurological: He is alert and oriented to person, place, and time.  Skin: Skin is warm and dry. No rash noted. He is not diaphoretic. No erythema. No pallor.  Psychiatric: He has a normal mood and affect. His behavior is normal. Judgment and thought content normal.    Data Reviewed Outside records  Assessment    Right inguinal hernia-reducible I agree that this is most likely asymptomatic but reducible right inguinal hernia. He does have a small hernia on exam on the right. He is symptomatic from this and we discussed the options for continued observation and multiple waiting versus laparoscopic or open repair. We discussed the risks and benefits and pros and cons of each.  We did discuss the risks of infection, bleeding, pain, scarring, recurrence, nerve injury and chronic pain or persistent pain, in bowel injury or injury to the vas deferens or testicle and he expressed understanding and he would like to proceed with right inguinal hernia repair with possible mesh. Given the need for anticoagulation, I think that open repair would probably be best for him. We will go ahead and set him up for elective right inguinal hernia with mesh after we receive clearance from his cardiologist      Plan    Cardiac clearance We will proceed with elective right inguinal hernia repair with mesh after cardiac clearance is received        Agapito Hanway DAVID 11/11/2012, 11:51 AM

## 2012-12-09 ENCOUNTER — Encounter (HOSPITAL_COMMUNITY): Payer: Self-pay | Admitting: Pharmacy Technician

## 2012-12-12 ENCOUNTER — Telehealth (INDEPENDENT_AMBULATORY_CARE_PROVIDER_SITE_OTHER): Payer: Self-pay

## 2012-12-12 NOTE — Telephone Encounter (Signed)
Left message for patient to call our office re: surgery clearance.  Rec'd cardiac clearance but no recommendations for holding Plavix was mentioned in clearance.  Patient notified that we are waiting to hear from Dr. Hoyle Barr office and surgery will need to be rescheduled.  I will contact patient after hearing from Dr. Maylon Cos nurse on 12/13/12.

## 2012-12-13 ENCOUNTER — Telehealth (INDEPENDENT_AMBULATORY_CARE_PROVIDER_SITE_OTHER): Payer: Self-pay

## 2012-12-13 ENCOUNTER — Other Ambulatory Visit (INDEPENDENT_AMBULATORY_CARE_PROVIDER_SITE_OTHER): Payer: Self-pay | Admitting: General Surgery

## 2012-12-13 ENCOUNTER — Other Ambulatory Visit (HOSPITAL_COMMUNITY): Payer: Self-pay | Admitting: General Surgery

## 2012-12-13 DIAGNOSIS — K4090 Unilateral inguinal hernia, without obstruction or gangrene, not specified as recurrent: Secondary | ICD-10-CM

## 2012-12-13 NOTE — Progress Notes (Signed)
ekg 11-24-2012  Dr Jocelyn Lamer on chart Lov/ cardiac clearance note 11-24-2012 dr Jocelyn Lamer on chart stress test 11-30-2012 dr Jocelyn Lamer on chart

## 2012-12-13 NOTE — Patient Instructions (Addendum)
20 DESHON HSIAO  12/13/2012   Your procedure is scheduled on: 12-28-2012  Report to [facility] at  0800 AM.  Call this number if you have problems the morning of surgery [phone]   Remember:   Do not eat food or drink liquids :After Midnight.     Take these medicines the morning of surgery with A SIP OF WATER: SYNTHROID, CRESTOR, PANTAPRAZOLE                                SEE [facility] PREPARING FOR SURGERY SHEET   Do not wear jewelry, make-up or nail polish.  Do not wear lotions, powders, or perfumes. You may wear deodorant.   Men may shave face and neck.  Do not bring valuables to the hospital.  Contacts, dentures or bridgework may not be worn into surgery.  Leave suitcase in the car. After surgery it may be brought to your room.  For patients admitted to the hospital, checkout time is 11:00 AM the day of discharge.   Patients discharged the day of surgery will not be allowed to drive home.  Name and phone number of your driver:WIFE [facility] CELL [phone]  Special Instructions   Please read over the following fact sheets that you were given: MRSA Information.  Call Cain Sieve RN pre op nurse if needed [phone]    FAILURE TO FOLLOW THESE INSTRUCTIONS MAY RESULT IN THE CANCELLATION OF YOUR SURGERY. PATIENT SIGNATURE___________________________________________

## 2012-12-13 NOTE — Telephone Encounter (Signed)
Called and spoke with Dr. Hoyle Barr nurse regarding patient Plavix.  Received cardiac clearance from Dr. Eldridge Dace but, no recommendations for patient to hold his Plavix was noted on the patient clearance.  Patient surgery has been rescheduled to 12/28/12 to allow time for Dr. Hoyle Barr office to respond and allow patient time enough to hold his Plavix prior to surgery,

## 2012-12-14 ENCOUNTER — Encounter (HOSPITAL_COMMUNITY): Payer: Self-pay

## 2012-12-14 ENCOUNTER — Encounter (HOSPITAL_COMMUNITY)
Admission: RE | Admit: 2012-12-14 | Discharge: 2012-12-14 | Disposition: A | Discharge status: Home / Self Care | Payer: BC Managed Care – PPO | Source: Ambulatory Visit | Attending: General Surgery | Admitting: General Surgery

## 2012-12-14 VITALS — BP 117/78 | HR 59 | Temp 97.7°F | Resp 18 | Ht 69.0 in | Wt 187.5 lb

## 2012-12-14 DIAGNOSIS — K4090 Unilateral inguinal hernia, without obstruction or gangrene, not specified as recurrent: Secondary | ICD-10-CM

## 2012-12-14 HISTORY — DX: Angina pectoris with documented spasm: I20.1

## 2012-12-14 LAB — CBC
HCT: 46.7 % (ref 39.0–52.0)
Hemoglobin: 15.7 g/dL (ref 13.0–17.0)
MCH: 29.8 pg (ref 26.0–34.0)
MCHC: 33.6 g/dL (ref 30.0–36.0)
RDW: 12.8 % (ref 11.5–15.5)

## 2012-12-14 NOTE — Progress Notes (Signed)
CMET 12-17-2012 DR SPEAR ON CHART

## 2012-12-15 ENCOUNTER — Telehealth (INDEPENDENT_AMBULATORY_CARE_PROVIDER_SITE_OTHER): Payer: Self-pay

## 2012-12-15 NOTE — Telephone Encounter (Signed)
Called patient to give recommendations rec'd from Dr. Eldridge Dace on holding Plavix. RE:  Per Dr. Eldridge Dace patient need's to hold Plavix 5 day's prior to open hernia repair surgery.

## 2012-12-15 NOTE — Telephone Encounter (Signed)
Spoke to patient wife and she was given pre-op recommendations for holding his Plavix 5 day's.  Also, given instructions on holding aspirin, NSAIDS, Fish Oil, Vit E 5 day's prior to surgery.  Patient wife understood.

## 2012-12-15 NOTE — Telephone Encounter (Signed)
Message copied by Maryan Puls on Thu Dec 15, 2012  2:51 PM ------      Message from: Lodema Pilot      Created: Fri Dec 09, 2012 12:26 PM      Regarding: RE: patient wants surgery       Contact: [phone]       He will need to be off his plavix for 7 days unless Dr. Leontine Locket recommends otherwise            ----- Message -----         From: Maryan Puls, CMA         Sent: 12/07/2012  11:12 AM           To: Lodema Pilot, DO      Subject: RE: patient wants surgery                                Dr. Biagio Quint,            I rec'd Cardiac Clearance on Mr. Ing and I am sending orders around for Katie to proceed.  Dr. Eldridge Dace states patient is cleared for hernia repair.             Iolani Twilley      ----- Message -----         From: Jari Sportsman         Sent: 12/07/2012  10:46 AM           To: Jari Sportsman, Maryan Puls, CMA, #      Subject: patient wants surgery                                    I can see that Mr Monte needed CC was rec'd 3/12 - does he need to return for OV or can you send over orders - re face sheet  I see EPIC orders from 2/14      Thanks      Katie              ------

## 2012-12-16 ENCOUNTER — Encounter (INDEPENDENT_AMBULATORY_CARE_PROVIDER_SITE_OTHER): Payer: Self-pay

## 2012-12-28 ENCOUNTER — Ambulatory Visit (HOSPITAL_COMMUNITY)
Admission: RE | Admit: 2012-12-28 | Discharge: 2012-12-28 | Disposition: A | Discharge status: Home / Self Care | Payer: BC Managed Care – PPO | Source: Ambulatory Visit | Attending: General Surgery | Admitting: General Surgery

## 2012-12-28 ENCOUNTER — Encounter (HOSPITAL_COMMUNITY): Payer: Self-pay | Admitting: Anesthesiology

## 2012-12-28 ENCOUNTER — Encounter (HOSPITAL_COMMUNITY)
Admission: RE | Disposition: A | Discharge status: Home / Self Care | Payer: Self-pay | Source: Ambulatory Visit | Attending: General Surgery

## 2012-12-28 ENCOUNTER — Ambulatory Visit (HOSPITAL_COMMUNITY): Payer: BC Managed Care – PPO | Admitting: Anesthesiology

## 2012-12-28 ENCOUNTER — Encounter (HOSPITAL_COMMUNITY): Payer: Self-pay

## 2012-12-28 DIAGNOSIS — D176 Benign lipomatous neoplasm of spermatic cord: Secondary | ICD-10-CM | POA: Insufficient documentation

## 2012-12-28 DIAGNOSIS — K4090 Unilateral inguinal hernia, without obstruction or gangrene, not specified as recurrent: Secondary | ICD-10-CM | POA: Insufficient documentation

## 2012-12-28 DIAGNOSIS — Z01812 Encounter for preprocedural laboratory examination: Secondary | ICD-10-CM | POA: Insufficient documentation

## 2012-12-28 DIAGNOSIS — Z79899 Other long term (current) drug therapy: Secondary | ICD-10-CM | POA: Insufficient documentation

## 2012-12-28 DIAGNOSIS — I2510 Atherosclerotic heart disease of native coronary artery without angina pectoris: Secondary | ICD-10-CM | POA: Insufficient documentation

## 2012-12-28 DIAGNOSIS — Z7982 Long term (current) use of aspirin: Secondary | ICD-10-CM | POA: Insufficient documentation

## 2012-12-28 DIAGNOSIS — E039 Hypothyroidism, unspecified: Secondary | ICD-10-CM | POA: Insufficient documentation

## 2012-12-28 DIAGNOSIS — E785 Hyperlipidemia, unspecified: Secondary | ICD-10-CM | POA: Insufficient documentation

## 2012-12-28 DIAGNOSIS — I10 Essential (primary) hypertension: Secondary | ICD-10-CM | POA: Insufficient documentation

## 2012-12-28 DIAGNOSIS — F17200 Nicotine dependence, unspecified, uncomplicated: Secondary | ICD-10-CM | POA: Insufficient documentation

## 2012-12-28 DIAGNOSIS — Z8673 Personal history of transient ischemic attack (TIA), and cerebral infarction without residual deficits: Secondary | ICD-10-CM | POA: Insufficient documentation

## 2012-12-28 HISTORY — PX: HERNIA REPAIR: SHX51

## 2012-12-28 HISTORY — PX: INSERTION OF MESH: SHX5868

## 2012-12-28 HISTORY — PX: INGUINAL HERNIA REPAIR: SHX194

## 2012-12-28 SURGERY — REPAIR, HERNIA, INGUINAL, ADULT
Anesthesia: General | Site: Groin | Laterality: Right | Wound class: Clean

## 2012-12-28 MED ORDER — MIDAZOLAM HCL 5 MG/5ML IJ SOLN
INTRAMUSCULAR | Status: DC | PRN
  Administered 2012-12-28: 2 mg via INTRAVENOUS

## 2012-12-28 MED ORDER — LIDOCAINE-EPINEPHRINE 1 %-1:100000 IJ SOLN
INTRAMUSCULAR | Status: AC
  Filled 2012-12-28: qty 1

## 2012-12-28 MED ORDER — BUPIVACAINE HCL (PF) 0.25 % IJ SOLN
INTRAMUSCULAR | Status: AC
  Filled 2012-12-28: qty 30

## 2012-12-28 MED ORDER — LACTATED RINGERS IV SOLN
INTRAVENOUS | Status: DC
  Administered 2012-12-28: 1000 mL via INTRAVENOUS

## 2012-12-28 MED ORDER — CEFAZOLIN SODIUM-DEXTROSE 2-3 GM-% IV SOLR
2.0000 g | INTRAVENOUS | Status: AC
  Administered 2012-12-28: 2 g via INTRAVENOUS

## 2012-12-28 MED ORDER — PROMETHAZINE HCL 25 MG/ML IJ SOLN: 6.2500 mg | INTRAMUSCULAR | Status: DC | PRN

## 2012-12-28 MED ORDER — ACETAMINOPHEN 10 MG/ML IV SOLN
INTRAVENOUS | Status: AC
  Filled 2012-12-28: qty 100

## 2012-12-28 MED ORDER — BUPIVACAINE HCL (PF) 0.25 % IJ SOLN
INTRAMUSCULAR | Status: DC | PRN
  Administered 2012-12-28: 15 mL

## 2012-12-28 MED ORDER — LACTATED RINGERS IV SOLN
INTRAVENOUS | Status: DC
  Administered 2012-12-28: 14:00:00 via INTRAVENOUS

## 2012-12-28 MED ORDER — CEFAZOLIN SODIUM-DEXTROSE 2-3 GM-% IV SOLR
INTRAVENOUS | Status: AC
  Filled 2012-12-28: qty 50

## 2012-12-28 MED ORDER — PROPOFOL 10 MG/ML IV BOLUS
INTRAVENOUS | Status: DC | PRN
  Administered 2012-12-28: 180 mg via INTRAVENOUS

## 2012-12-28 MED ORDER — LIDOCAINE-EPINEPHRINE 1 %-1:100000 IJ SOLN
INTRAMUSCULAR | Status: DC | PRN
  Administered 2012-12-28: 15 mL

## 2012-12-28 MED ORDER — ACETAMINOPHEN 10 MG/ML IV SOLN
INTRAVENOUS | Status: DC | PRN
  Administered 2012-12-28: 1000 mg via INTRAVENOUS

## 2012-12-28 MED ORDER — LIDOCAINE HCL (CARDIAC) 20 MG/ML IV SOLN
INTRAVENOUS | Status: DC | PRN
  Administered 2012-12-28: 50 mg via INTRAVENOUS

## 2012-12-28 MED ORDER — HYDROMORPHONE HCL PF 1 MG/ML IJ SOLN
INTRAMUSCULAR | Status: AC
  Filled 2012-12-28: qty 1

## 2012-12-28 MED ORDER — HYDROMORPHONE HCL PF 1 MG/ML IJ SOLN
0.2500 mg | INTRAMUSCULAR | Status: DC | PRN
  Administered 2012-12-28 (×4): 0.5 mg via INTRAVENOUS

## 2012-12-28 MED ORDER — FENTANYL CITRATE 0.05 MG/ML IJ SOLN
INTRAMUSCULAR | Status: DC | PRN
  Administered 2012-12-28: 100 ug via INTRAVENOUS
  Administered 2012-12-28 (×3): 50 ug via INTRAVENOUS

## 2012-12-28 MED ORDER — OXYCODONE HCL 5 MG PO TABS
10.0000 mg | ORAL_TABLET | Freq: Once | ORAL | Status: AC
  Administered 2012-12-28: 10 mg via ORAL
  Filled 2012-12-28: qty 2

## 2012-12-28 MED ORDER — HYDROCODONE-ACETAMINOPHEN 5-325 MG PO TABS: 1.0000 | ORAL_TABLET | ORAL | Status: DC | PRN

## 2012-12-28 MED ORDER — HYDROMORPHONE HCL PF 1 MG/ML IJ SOLN
0.5000 mg | INTRAMUSCULAR | Status: AC
  Administered 2012-12-28 (×2): 0.5 mg via INTRAVENOUS

## 2012-12-28 SURGICAL SUPPLY — 34 items
ADH SKN CLS APL DERMABOND .7 (GAUZE/BANDAGES/DRESSINGS) ×1
APL SKNCLS STERI-STRIP NONHPOA (GAUZE/BANDAGES/DRESSINGS)
BENZOIN TINCTURE PRP APPL 2/3 (GAUZE/BANDAGES/DRESSINGS) ×1 IMPLANT
BLADE SURG SZ10 CARB STEEL (BLADE) IMPLANT
CHLORAPREP W/TINT 26ML (MISCELLANEOUS) ×2 IMPLANT
CLOTH BEACON ORANGE TIMEOUT ST (SAFETY) ×2 IMPLANT
DECANTER SPIKE VIAL GLASS SM (MISCELLANEOUS) IMPLANT
DERMABOND ADVANCED (GAUZE/BANDAGES/DRESSINGS) ×1
DERMABOND ADVANCED .7 DNX12 (GAUZE/BANDAGES/DRESSINGS) ×1 IMPLANT
DISSECTOR ROUND CHERRY 3/8 STR (MISCELLANEOUS) ×1 IMPLANT
DRAIN PENROSE 18X1/2 LTX STRL (DRAIN) ×1 IMPLANT
DRAPE LAPAROTOMY TRNSV 102X78 (DRAPE) ×2 IMPLANT
DRAPE UTILITY XL STRL (DRAPES) ×2 IMPLANT
DRSG TEGADERM 4X4.75 (GAUZE/BANDAGES/DRESSINGS) ×1 IMPLANT
ELECT CAUTERY BLADE 6.4 (BLADE) ×2 IMPLANT
ELECT REM PT RETURN 9FT ADLT (ELECTROSURGICAL) ×2
ELECTRODE REM PT RTRN 9FT ADLT (ELECTROSURGICAL) ×1 IMPLANT
GLOVE BIO SURGEON STRL SZ7.5 (GLOVE) ×2 IMPLANT
GLOVE SURG SS PI 7.5 STRL IVOR (GLOVE) ×4 IMPLANT
GOWN STRL NON-REIN LRG LVL3 (GOWN DISPOSABLE) ×2 IMPLANT
GOWN STRL REIN XL XLG (GOWN DISPOSABLE) ×4 IMPLANT
KIT BASIN OR (CUSTOM PROCEDURE TRAY) ×2 IMPLANT
MESH ULTRAPRO 3X6 7.6X15CM (Mesh General) ×1 IMPLANT
NEEDLE HYPO 22GX1.5 SAFETY (NEEDLE) ×2 IMPLANT
PACK GENERAL/GYN (CUSTOM PROCEDURE TRAY) ×2 IMPLANT
STRIP CLOSURE SKIN 1/2X4 (GAUZE/BANDAGES/DRESSINGS) ×1 IMPLANT
SUT MNCRL AB 4-0 PS2 18 (SUTURE) ×2 IMPLANT
SUT PROLENE 2 0 BLUE (SUTURE) ×6 IMPLANT
SUT VIC AB 2-0 SH 27 (SUTURE) ×10
SUT VIC AB 2-0 SH 27X BRD (SUTURE) ×2 IMPLANT
SUT VIC AB 3-0 SH 27 (SUTURE) ×2
SUT VIC AB 3-0 SH 27X BRD (SUTURE) IMPLANT
SYR CONTROL 10ML LL (SYRINGE) ×2 IMPLANT
TOWEL OR 17X26 10 PK STRL BLUE (TOWEL DISPOSABLE) ×2 IMPLANT

## 2012-12-28 NOTE — Transfer of Care (Signed)
Immediate Anesthesia Transfer of Care Note  Patient: Carl [name]  Procedure(s) Performed: Procedure(s): HERNIA REPAIR INGUINAL ADULT right with mesh  (Right) INSERTION OF MESH (Right)  Patient Location: PACU  Anesthesia Type:General  Level of Consciousness: awake, alert  and oriented  Airway & Oxygen Therapy: Patient Spontanous Breathing and Patient connected to face mask oxygen  Post-op Assessment: Report given to PACU RN and Post -op Vital signs reviewed and stable  Post vital signs: Reviewed and stable  Complications: No apparent anesthesia complications

## 2012-12-28 NOTE — Anesthesia Preprocedure Evaluation (Signed)
Anesthesia Evaluation  Patient identified by MRN, date of birth, ID band Patient awake    Reviewed: Allergy & Precautions, H&P , NPO status , Patient's Chart, lab work & pertinent test results  Airway Mallampati: II TM Distance: >3 FB Neck ROM: Full    Dental  (+) Poor Dentition, Dental Advisory Given and Missing   Pulmonary Current Smoker,  breath sounds clear to auscultation  Pulmonary exam normal       Cardiovascular hypertension, Pt. on medications + angina + CAD (Nonobstructive CAD; hx of Prinzmetal angina) Rhythm:Regular Rate:Normal     Neuro/Psych CVA, No Residual Symptoms negative psych ROS   GI/Hepatic negative GI ROS, Neg liver ROS,   Endo/Other  Hypothyroidism   Renal/GU negative Renal ROS  negative genitourinary   Musculoskeletal negative musculoskeletal ROS (+)   Abdominal   Peds  Hematology negative hematology ROS (+)   Anesthesia Other Findings Multiple missing teeth; poor dentition.  Reproductive/Obstetrics                           Anesthesia Physical Anesthesia Plan  ASA: III  Anesthesia Plan: General   Post-op Pain Management:    Induction: Intravenous  Airway Management Planned: LMA  Additional Equipment:   Intra-op Plan:   Post-operative Plan: Extubation in OR  Informed Consent: I have reviewed the patients History and Physical, chart, labs and discussed the procedure including the risks, benefits and alternatives for the proposed anesthesia with the patient or authorized representative who has indicated his/her understanding and acceptance.   Dental advisory given  Plan Discussed with: CRNA  Anesthesia Plan Comments:         Anesthesia Quick Evaluation

## 2012-12-28 NOTE — Brief Op Note (Signed)
12/28/2012  12:22 PM  PATIENT:  Carl [name]  57 y.o. male  PRE-OPERATIVE DIAGNOSIS:  right inguinal hernia   POST-OPERATIVE DIAGNOSIS:  right inguinal hernia   PROCEDURE:  Procedure(s): HERNIA REPAIR INGUINAL ADULT right with mesh  (Right) INSERTION OF MESH (Right)  SURGEON:  Surgeon(s) and Role:    * Lodema Pilot, DO - Primary  PHYSICIAN ASSISTANT:   ASSISTANTS: none   ANESTHESIA:   general  EBL:     BLOOD ADMINISTERED:none  DRAINS: none   LOCAL MEDICATIONS USED:  MARCAINE    and LIDOCAINE   SPECIMEN:  No Specimen  DISPOSITION OF SPECIMEN:  [name]  COUNTS:  YES  TOURNIQUET:  * No tourniquets in log *  DICTATION: .Other Dictation: Dictation Number [id number]  PLAN OF CARE: Discharge to home after PACU  PATIENT DISPOSITION:  PACU - hemodynamically stable.   Delay start of Pharmacological VTE agent (>24hrs) due to surgical blood loss or risk of bleeding: no

## 2012-12-28 NOTE — H&P (Signed)
Carl [name] is a 57 y.o. male. This patient is referred by Dr. Collins Scotland for evaluation of a bulge in the right groin and possible inguinal hernia. He said that he first noticed this on Christmas day as a "knot" in his right groin. He says that initially it did not cause any discomfort but it has been fluctuating in size and is more pronounced with walking or standing. He also notices burning and tearing discomfort in the area mainly with walking or prolonged standing and he does get some relief when lying flat and with rest. He says that his bowels are normal in denies any peptic symptoms such as nausea or vomiting. He denies any prior history of colonoscopy. He did have a stroke in April of 2013, and is have some cardiac blockages and coronary artery disease and remains on Plavix and aspirin for this. He denies any changes from our last visit and has since received clearance from his physician to proceed with surgery. HPI  Past Medical History   Diagnosis  Date   .  Hypertension    .  Hyperlipemia    .  Hypothyroid    .  CAD (coronary artery disease)    .  Stroke    .  Arthritis     Past Surgical History   Procedure  Laterality  Date   .  Hernia repair      Family History   Problem  Relation  Age of Onset   .  Coronary artery disease     Social History  History   Substance Use Topics   .  Smoking status:  Current Every Day Smoker -- 1.00 packs/day   .  Smokeless tobacco:  Not on file   .  Alcohol Use:  Yes   No Known Allergies  Current Outpatient Prescriptions   Medication  Sig  Dispense  Refill   .  aspirin EC 81 MG tablet  Take 81 mg by mouth daily.     .  clopidogrel (PLAVIX) 75 MG tablet  Take 1 tablet (75 mg total) by mouth daily with breakfast.  60 tablet  3   .  cyclobenzaprine (FLEXERIL) 10 MG tablet  Take 10 mg by mouth 3 (three) times daily as needed. For spasms     .  fenofibrate 54 MG tablet  Take 54 mg by mouth daily.     Marland Kitchen  HYDROcodone-acetaminophen (NORCO) 10-325 MG  per tablet  Take 1 tablet by mouth every 6 (six) hours as needed. For pain     .  ibuprofen (ADVIL,MOTRIN) 800 MG tablet  Take 800 mg by mouth every 8 (eight) hours as needed. For pain     .  levothyroxine (SYNTHROID, LEVOTHROID) 100 MCG tablet  Take 100 mcg by mouth daily.     Marland Kitchen  lisinopril (PRINIVIL,ZESTRIL) 5 MG tablet  Take 5 mg by mouth daily.     .  nitroGLYCERIN (NITRODUR - DOSED IN MG/24 HR) 0.2 mg/hr  Place 1 patch onto the skin daily.     .  nitroGLYCERIN (NITROSTAT) 0.3 MG SL tablet  Place 1 tablet (0.3 mg total) under the tongue every 5 (five) minutes as needed for chest pain.  60 tablet  0   .  pantoprazole (PROTONIX) 40 MG tablet  Take 40 mg by mouth daily.     .  rosuvastatin (CRESTOR) 40 MG tablet  Take 40 mg by mouth daily.      No current facility-administered medications for this  visit.   Review of Systems  Review of Systems  All other review of systems negative or noncontributory except as stated in the HPI  Wt Readings from Last 3 Encounters:  12/14/12 187 lb 8 oz (85.049 kg)  11/11/12 185 lb (83.915 kg)  12/31/11 174 lb 12.8 oz (79.289 kg)   Temp Readings from Last 3 Encounters:  12/28/12 98 F (36.7 C)   12/28/12 98 F (36.7 C)   12/14/12 97.7 F (36.5 C) Oral   BP Readings from Last 3 Encounters:  12/28/12 131/71  12/28/12 131/71  12/14/12 117/78   Pulse Readings from Last 3 Encounters:  12/28/12 64  12/28/12 64  12/14/12 59    Physical Exam  Physical Exam  Physical Exam  Vitals reviewed.  Constitutional: He is oriented to person, place, and time. He appears well-developed and well-nourished. No distress.  HENT:  Head: Normocephalic and atraumatic.  Mouth/Throat: No oropharyngeal exudate.  Eyes: Conjunctivae and EOM are normal. Pupils are equal, round, and reactive to light. Right eye exhibits no discharge. Left eye exhibits no discharge. No scleral icterus.  Neck: Normal range of motion. No tracheal deviation present.  Cardiovascular: Normal  rate, regular rhythm and normal heart sounds.  Pulmonary/Chest: Effort normal and breath sounds normal. No stridor. No respiratory distress. He has no wheezes. He has no rales. He exhibits no tenderness.  Abdominal: Soft. Bowel sounds are normal. He exhibits no distension and no mass. There is no tenderness. There is no rebound and no guarding. small and reducible RIH, no LIH identified. Musculoskeletal: Normal range of motion. He exhibits no edema and no tenderness.  Neurological: He is alert and oriented to person, place, and time.  Skin: Skin is warm and dry. No rash noted. He is not diaphoretic. No erythema. No pallor.  Psychiatric: He has a normal mood and affect. His behavior is normal. Judgment and thought content normal.  Data Reviewed  Outside records  Assessment  Right inguinal hernia-reducible  I have seen and examined him in the preop area.  The site was marked and risks again discussed.  He does have a small hernia on exam on the right. He is symptomatic from this and we discussed the options for continued observation and multiple waiting versus laparoscopic or open repair. We discussed the risks and benefits and pros and cons of each. We did discuss the risks of infection, bleeding, pain, scarring, recurrence, nerve injury and chronic pain or persistent pain, in bowel injury or injury to the vas deferens or testicle and he expressed understanding and he would like to proceed with right inguinal hernia repair with possible mesh.

## 2012-12-28 NOTE — Anesthesia Postprocedure Evaluation (Signed)
Anesthesia Post Note  Patient: Carl [name]  Procedure(s) Performed: Procedure(s) (LRB): HERNIA REPAIR INGUINAL ADULT right with mesh  (Right) INSERTION OF MESH (Right)  Anesthesia type: General  Patient location: PACU  Post pain: Pain level controlled  Post assessment: Post-op Vital signs reviewed  Last Vitals:  Filed Vitals:   12/28/12 1245  BP: 139/78  Pulse: 67  Temp:   Resp: 12    Post vital signs: Reviewed  Level of consciousness: sedated  Complications: No apparent anesthesia complications

## 2012-12-29 ENCOUNTER — Encounter (HOSPITAL_COMMUNITY): Payer: Self-pay | Admitting: General Surgery

## 2012-12-29 ENCOUNTER — Emergency Department (HOSPITAL_COMMUNITY)
Admission: EM | Admit: 2012-12-29 | Discharge: 2012-12-29 | Disposition: A | Discharge status: Home / Self Care | Payer: BC Managed Care – PPO | Attending: Emergency Medicine | Admitting: Emergency Medicine

## 2012-12-29 ENCOUNTER — Telehealth (INDEPENDENT_AMBULATORY_CARE_PROVIDER_SITE_OTHER): Payer: Self-pay | Admitting: *Deleted

## 2012-12-29 DIAGNOSIS — Z79899 Other long term (current) drug therapy: Secondary | ICD-10-CM | POA: Insufficient documentation

## 2012-12-29 DIAGNOSIS — I10 Essential (primary) hypertension: Secondary | ICD-10-CM | POA: Insufficient documentation

## 2012-12-29 DIAGNOSIS — Z8639 Personal history of other endocrine, nutritional and metabolic disease: Secondary | ICD-10-CM | POA: Insufficient documentation

## 2012-12-29 DIAGNOSIS — Z7982 Long term (current) use of aspirin: Secondary | ICD-10-CM | POA: Insufficient documentation

## 2012-12-29 DIAGNOSIS — F17200 Nicotine dependence, unspecified, uncomplicated: Secondary | ICD-10-CM | POA: Insufficient documentation

## 2012-12-29 DIAGNOSIS — E039 Hypothyroidism, unspecified: Secondary | ICD-10-CM | POA: Insufficient documentation

## 2012-12-29 DIAGNOSIS — Z8739 Personal history of other diseases of the musculoskeletal system and connective tissue: Secondary | ICD-10-CM | POA: Insufficient documentation

## 2012-12-29 DIAGNOSIS — Z7902 Long term (current) use of antithrombotics/antiplatelets: Secondary | ICD-10-CM | POA: Insufficient documentation

## 2012-12-29 DIAGNOSIS — E785 Hyperlipidemia, unspecified: Secondary | ICD-10-CM | POA: Insufficient documentation

## 2012-12-29 DIAGNOSIS — I2510 Atherosclerotic heart disease of native coronary artery without angina pectoris: Secondary | ICD-10-CM | POA: Insufficient documentation

## 2012-12-29 DIAGNOSIS — M79609 Pain in unspecified limb: Secondary | ICD-10-CM | POA: Insufficient documentation

## 2012-12-29 DIAGNOSIS — R109 Unspecified abdominal pain: Secondary | ICD-10-CM | POA: Insufficient documentation

## 2012-12-29 DIAGNOSIS — Z8679 Personal history of other diseases of the circulatory system: Secondary | ICD-10-CM | POA: Insufficient documentation

## 2012-12-29 DIAGNOSIS — Z8673 Personal history of transient ischemic attack (TIA), and cerebral infarction without residual deficits: Secondary | ICD-10-CM | POA: Insufficient documentation

## 2012-12-29 DIAGNOSIS — G8929 Other chronic pain: Secondary | ICD-10-CM | POA: Insufficient documentation

## 2012-12-29 DIAGNOSIS — Z862 Personal history of diseases of the blood and blood-forming organs and certain disorders involving the immune mechanism: Secondary | ICD-10-CM | POA: Insufficient documentation

## 2012-12-29 DIAGNOSIS — G8918 Other acute postprocedural pain: Secondary | ICD-10-CM | POA: Insufficient documentation

## 2012-12-29 MED ORDER — HYDROMORPHONE HCL PF 1 MG/ML IJ SOLN: 1.0000 mg | Freq: Once | INTRAMUSCULAR | Status: DC

## 2012-12-29 MED ORDER — FENTANYL CITRATE 0.05 MG/ML IJ SOLN
50.0000 ug | Freq: Once | INTRAMUSCULAR | Status: AC
  Administered 2012-12-29: 50 ug via INTRAMUSCULAR
  Filled 2012-12-29: qty 2

## 2012-12-29 MED ORDER — HYDROMORPHONE HCL PF 1 MG/ML IJ SOLN
1.0000 mg | Freq: Once | INTRAMUSCULAR | Status: AC
  Administered 2012-12-29: 1 mg via INTRAVENOUS
  Filled 2012-12-29: qty 1

## 2012-12-29 MED ORDER — HYDROMORPHONE HCL 4 MG PO TABS: 4.0000 mg | ORAL_TABLET | ORAL | Status: DC | PRN

## 2012-12-29 NOTE — Op Note (Signed)
Carl [name], Carl [name]                 ACCOUNT NO.:  [id number]  MEDICAL RECORD NO.:  [id number]  LOCATION:  [facility]                         FACILITY:  [facility]  PHYSICIAN:  Lodema Pilot, MD       DATE OF BIRTH:  14-Apr-1956  DATE OF PROCEDURE:  12/28/2012 DATE OF DISCHARGE:  12/28/2012                              OPERATIVE REPORT   PROCEDURE:  Open repair of right inguinal hernia with mesh.  PREOPERATIVE DIAGNOSIS:  Right inguinal hernia.  POSTOPERATIVE DIAGNOSIS:  Right inguinal hernia.  SURGEON:  Lodema Pilot, MD  ASSISTANT:  None.  ANESTHESIA:  General LMA anesthesia with 30 mL of 1% lidocaine with epinephrine and 0.25% Marcaine in a 50:50 mixture.  FLUIDS:  800 mL of crystalloid.  ESTIMATED BLOOD LOSS:  Minimal.  DRAINS:  None.  SPECIMENS:  None.  COMPLICATIONS:  None apparent.  FINDINGS:  Right cord lipoma and right indirect and direct inguinal hernia Lichtenstein repair with 3-inch x 6-inch UltraPro mesh.  INDICATION FOR PROCEDURE:  Mr. Taul is a 57 year old male with a mildly symptomatic right inguinal hernia on history and on physical exam, desires surgical repair.  OPERATIVE DETAILS:  Mr. Eliot was seen and evaluated in the preoperative area.  Risks and benefits of procedure were discussed in lay terms. Informed consent was obtained.  The surgical site was marked prior to anesthetic administration and prophylactic antibiotics were given.  He was taken to the operating room, placed on table in supine position. General LMA anesthesia induced.  His right groin was prepped and draped in a standard surgical fashion and procedure time-out was performed with all operative team members to confirm proper patient and procedure.  The oblique incision was made over the right inguinal canal.  Dissection carried down to subcutaneous tissue using Bovie electrocautery.  The external oblique fascia was elevated and sharply incised.  The spermatic cord was dissected circumferentially  at the pubic tubercle.  The cord was skeletonized and he had a large direct hernia defect with bulging from the inguinal floor and joining the internal ring.  He also had a large right cord lipoma.  The lipoma was ligated at the internal ring and removed.  The internal ring was tightened with a 2-0 Vicryl suture. The direct hernia was dissected free from the cord structures and the floor of the inguinal canal was imbricated over the direct defects and this allowed the placement of the mesh.  The cord structures were preserved and he did not have any obvious indirect hernia sac.  Then, a 3-inch x 6-inch UltraPro mesh was cut to fit the inguinal canal and sutured in place at the pubic tubercle.  A 2-0 Prolene suture was run along the shelving edge of the inguinal ligament.  The mesh was slit laterally and the tails of mesh were passed around the spermatic cord. The mesh was tacked to the abdominal wall fascia with interrupted 2-0 Vicryl sutures circumferentially taking care to avoid injury to nerve structures.  The tails of the mesh were tacked laterally creating a new internal ring.  The wound was inspected.  Hemostasis was noted be adequate.  The wound was irrigated and injected with total of  30 mL of 1% lidocaine with epinephrine and 0.25% Marcaine in 50:50 mixture.  The external oblique fascia was approximated with running 2-0 Vicryl suture and the Scarpa fascia was approximated with 3-0 Vicryl suture.  Skin edges were approximated with 4-0 Monocryl subcuticular suture.  Skin was washed and dried and Dermabond was applied.  All sponge, needle, and instrument counts were correct at the end of the case.  The patient tolerated the procedure well without apparent complications.          ______________________________ Lodema Pilot, MD     BL/MEDQ  D:  12/28/2012  T:  12/29/2012  Job:  [id number]

## 2012-12-29 NOTE — ED Provider Notes (Signed)
History     CSN: [id number]  Arrival date & time 12/29/12  1906   First MD Initiated Contact with Patient 12/29/12 2100      Chief Complaint  Patient presents with  . Inguinal Hernia    post op (yesterday)    (Consider location/radiation/quality/duration/timing/severity/associated sxs/prior treatment) HPI  57 year old male presents to the emergency department chief complaint of post surgical groin pain.  Patient had inguinal hernia repair one day ago.  Patient has a history of chronic back pain and has been using the therapy for many years.  The patient was discharged with Vicodin which gave him no relief of his pain, prescription for Percocet was called in which also gave him to leave.  Patient states that he is extremely uncomfortable.  He is unable to walk on the leg due to his pain.  He denies any fevers, nausea, vomiting.  He denies any difficulty with urination.  He has some pain in the right testicle and right groin.  Past Medical History  Diagnosis Date  . Hypertension   . Hyperlipemia   . Hypothyroid   . Arthritis   . CAD (coronary artery disease)   . Stroke APRIL 06/2012  . Prinzmetal angina     Past Surgical History  Procedure Laterality Date  . Cardiac catheterization  06-13-2007 AND 10-08-2009  . Inguinal hernia repair Right 12/28/2012    Procedure: HERNIA REPAIR INGUINAL ADULT right with mesh ;  Surgeon: Lodema Pilot, DO;  Location: [facility];  Service: General;  Laterality: Right;  . Insertion of mesh Right 12/28/2012    Procedure: INSERTION OF MESH;  Surgeon: Lodema Pilot, DO;  Location: [facility];  Service: General;  Laterality: Right;    Family History  Problem Relation Age of Onset  . Coronary artery disease      History  Substance Use Topics  . Smoking status: Current Every Day Smoker -- 1.00 packs/day for 30 years    Types: Cigarettes  . Smokeless tobacco: Never Used  . Alcohol Use: Yes     Comment: OCCASIONAL BEER      Review of Systems Ten systems  reviewed and are negative for acute change, except as noted in the HPI.   Allergies  Review of patient's allergies indicates no known allergies.  Home Medications   Current Outpatient Rx  Name  Route  Sig  Dispense  Refill  . aspirin EC 81 MG tablet   Oral   Take 81 mg by mouth at bedtime.          . clopidogrel (PLAVIX) 75 MG tablet   Oral   Take 1 tablet (75 mg total) by mouth daily with breakfast.   60 tablet   3   . cyclobenzaprine (FLEXERIL) 10 MG tablet   Oral   Take 10 mg by mouth 3 (three) times daily as needed. For spasms         . fenofibrate (TRICOR) 145 MG tablet   Oral   Take 145 mg by mouth every morning.         Marland Kitchen HYDROcodone-acetaminophen (NORCO) 10-325 MG per tablet   Oral   Take 1 tablet by mouth every 6 (six) hours as needed. For pain         . HYDROcodone-acetaminophen (NORCO) 5-325 MG per tablet   Oral   Take 1 tablet by mouth every 4 (four) hours as needed for pain.   40 tablet   0   . ibuprofen (ADVIL,MOTRIN) 800 MG tablet  Oral   Take 800 mg by mouth every 8 (eight) hours as needed. For pain         . levothyroxine (SYNTHROID, LEVOTHROID) 100 MCG tablet   Oral   Take 100 mcg by mouth every morning.          Marland Kitchen lisinopril (PRINIVIL,ZESTRIL) 5 MG tablet   Oral   Take 5 mg by mouth every morning.          . nitroGLYCERIN (NITRODUR - DOSED IN MG/24 HR) 0.2 mg/hr   Transdermal   Place 1 patch onto the skin daily.         . nitroGLYCERIN (NITROSTAT) 0.3 MG SL tablet   Sublingual   Place 1 tablet (0.3 mg total) under the tongue every 5 (five) minutes as needed for chest pain.   60 tablet   0   . pantoprazole (PROTONIX) 40 MG tablet   Oral   Take 40 mg by mouth every morning.          . rosuvastatin (CRESTOR) 40 MG tablet   Oral   Take 40 mg by mouth every morning.            BP 119/73  Pulse 69  Temp(Src) 98.8 F (37.1 C) (Oral)  Resp 16  SpO2 98%  Physical Exam  Nursing note and vitals  reviewed. Constitutional: He appears well-developed and well-nourished. No distress.  Appears uncomfortable  HENT:  Head: Normocephalic and atraumatic.  Eyes: Conjunctivae are normal. No scleral icterus.  Neck: Normal range of motion. Neck supple.  Cardiovascular: Normal rate, regular rhythm and normal heart sounds.   Pulmonary/Chest: Effort normal and breath sounds normal. No respiratory distress.  Abdominal: Soft. There is no tenderness. Hernia confirmed negative in the right inguinal area.  Genitourinary:    Cremasteric reflex is present. Right testis shows swelling and tenderness. Right testis shows no mass. Right testis is descended. Cremasteric reflex is not absent on the right side. Left testis shows no mass, no swelling and no tenderness. Left testis is descended. Cremasteric reflex is not absent on the left side. Circumcised. Penile erythema (at the base of the shaft) present. No phimosis, paraphimosis, hypospadias or penile tenderness. No discharge found.  Well-healing surgical incision site in the right inguinal area.  There is no discharge.  No surrounding cellulitis or signs of infection.  There is some swelling and tenderness that extends into the right scrotum and the base of the shaft of his penis.  He is tender to palpation.  Musculoskeletal: He exhibits no edema.  Lymphadenopathy:       Right: No inguinal adenopathy present.  Neurological: He is alert.  Skin: Skin is warm and dry. He is not diaphoretic.  Psychiatric: His behavior is normal.    ED Course  Procedures (including critical care time)  Labs Reviewed - No data to display No results found.   1. Post-op pain       MDM  Patient with post operative surgical pain that is uncontrolled at home with his current medications.  I do not suspect any infection.  There is no abdominal tenderness.  Patient has stable vital signs without tachycardia or hypertension.  I will initiate pain control here in the emergency  department with dilaudid.   10:43 PM BP 119/73  Pulse 69  Temp(Src) 98.8 F (37.1 C) (Oral)  Resp 16  SpO2 98% Patient here with postoperative pain.  I spoke with Dr. Janee Morn about his condition.  The plan to discharge the  patient on Dilaudid by mouth for pain control.  He may followup with [facility].  He is to discontinue his other pain medications.  Patient states that his pain is now 4/10 as it was 10 out of 10 previously.  He is able to cope with this.     Arthor Captain, PA-C 12/29/12 2252

## 2012-12-29 NOTE — Telephone Encounter (Signed)
Patient called to state that his pain is 10/10.  Patient states he is taking the Norco as prescribed however he has been on Hydrocodone high doses for years so it isn't helping with his pain now.  Patient encouraged to use Ibuprofen 600mg  every 6 hours alternated with the Norco to try to help with pain.  Patient also encouraged to try to use ice to improve pain.  Patient states he is doing all of that and still only slept about 1 hour last night.  Encouraged patient to continue regimen a this time.

## 2012-12-29 NOTE — Telephone Encounter (Signed)
Biagio Quint MD has written prescription for Oxycodone IR to try to help with pain management.  Layton MD states patient has high tolerance due long use of narcotic use.  Left message on patients home answering machine to call back to inform them of new prescription.

## 2012-12-29 NOTE — Telephone Encounter (Signed)
Wife came by to pick up prescription at this time.

## 2012-12-29 NOTE — ED Notes (Signed)
Pt states inguinal hernia repair (mech) done yesterday at [facility]. Pt states he was sent home with rx for vicoden. Pt took vicoden all day and states pain never got better. Pt had rx for percocet called in and states that he took 2 at 14:00 and 1 at 18:00 with no relief. Pt denies retear of inginual repair.

## 2012-12-30 ENCOUNTER — Encounter (INDEPENDENT_AMBULATORY_CARE_PROVIDER_SITE_OTHER): Payer: BC Managed Care – PPO | Admitting: General Surgery

## 2012-12-30 LAB — POCT I-STAT, CHEM 8
Creatinine, Ser: 0.9 mg/dL (ref 0.50–1.35)
HCT: 48 % (ref 39.0–52.0)
Hemoglobin: 16.3 g/dL (ref 13.0–17.0)
Sodium: 135 mEq/L (ref 135–145)
TCO2: 22 mmol/L (ref 0–100)

## 2012-12-30 NOTE — ED Provider Notes (Signed)
Medical screening examination/treatment/procedure(s) were performed by non-physician practitioner and as supervising physician I was immediately available for consultation/collaboration.  Dalene Robards R. Aideliz Garmany, MD 12/30/12 0018 

## 2013-01-19 ENCOUNTER — Encounter (INDEPENDENT_AMBULATORY_CARE_PROVIDER_SITE_OTHER): Payer: Self-pay | Admitting: General Surgery

## 2013-01-19 ENCOUNTER — Ambulatory Visit (INDEPENDENT_AMBULATORY_CARE_PROVIDER_SITE_OTHER): Payer: BC Managed Care – PPO | Admitting: General Surgery

## 2013-01-19 VITALS — BP 108/80 | HR 66 | Temp 97.7°F | Ht 69.0 in | Wt 178.2 lb

## 2013-01-19 DIAGNOSIS — Z5189 Encounter for other specified aftercare: Secondary | ICD-10-CM

## 2013-01-19 DIAGNOSIS — Z4889 Encounter for other specified surgical aftercare: Secondary | ICD-10-CM

## 2013-01-19 NOTE — Progress Notes (Signed)
Subjective:     Patient ID: Carl [name], male   DOB: May 22, 1956, 57 y.o.   MRN: [id number]  HPI This patient follows up in 3 weeks status post open repair of right inguinal hernia with mesh. He had some postoperative paininitially emergency room for treatment of this but since then has been feeling better. He is no longer taking any pain medication for his hernia although it has had some mild burning still in the area.  There is no evidence of any recurrent hernia or bulging. His symptoms seem to be improving.  Review of Systems     Objective:   Physical Exam His incision is healing nicely without sign of infection. He has a normal healing ridge but no evidence of recurrent hernia with Valsalva    Assessment:     Status post open repair of right inguinal hernia-doing well He seems to be recovering okay for this procedure without any evidence of any postoperative complication. I think that some of the burning that he has in the area of is normal for this stage postop and should continue to improve with time. There is no evidence of any hernia recurrence and I recommended that he continue with light duty for another one to 2 weeks and then he can gradually increase activity as tolerated     Plan:     Light duty for another one to 2 weeks and then he can gradually increase activity as tolerated. He can followup with me when necessaryif the discomfort in his groin is not completely disappeared in another 3 months

## 2013-08-02 ENCOUNTER — Other Ambulatory Visit (HOSPITAL_COMMUNITY): Payer: Self-pay | Admitting: Family Medicine

## 2013-08-02 ENCOUNTER — Ambulatory Visit (HOSPITAL_COMMUNITY)
Admission: RE | Admit: 2013-08-02 | Discharge: 2013-08-02 | Disposition: A | Discharge status: Home / Self Care | Payer: BC Managed Care – PPO | Source: Ambulatory Visit | Attending: Vascular Surgery | Admitting: Vascular Surgery

## 2013-08-02 DIAGNOSIS — I739 Peripheral vascular disease, unspecified: Secondary | ICD-10-CM | POA: Insufficient documentation

## 2013-08-05 ENCOUNTER — Encounter: Payer: Self-pay | Admitting: Emergency Medicine

## 2013-08-05 ENCOUNTER — Emergency Department (INDEPENDENT_AMBULATORY_CARE_PROVIDER_SITE_OTHER): Payer: BC Managed Care – PPO

## 2013-08-05 ENCOUNTER — Emergency Department
Admission: EM | Admit: 2013-08-05 | Discharge: 2013-08-05 | Disposition: A | Discharge status: Home / Self Care | Payer: BC Managed Care – PPO | Source: Home / Self Care | Attending: Family Medicine | Admitting: Family Medicine

## 2013-08-05 DIAGNOSIS — S59909A Unspecified injury of unspecified elbow, initial encounter: Secondary | ICD-10-CM

## 2013-08-05 DIAGNOSIS — R937 Abnormal findings on diagnostic imaging of other parts of musculoskeletal system: Secondary | ICD-10-CM

## 2013-08-05 DIAGNOSIS — M79609 Pain in unspecified limb: Secondary | ICD-10-CM

## 2013-08-05 DIAGNOSIS — S6992XA Unspecified injury of left wrist, hand and finger(s), initial encounter: Secondary | ICD-10-CM

## 2013-08-05 DIAGNOSIS — Z716 Tobacco abuse counseling: Secondary | ICD-10-CM

## 2013-08-05 MED ORDER — MELOXICAM 15 MG PO TABS: 7.5000 mg | ORAL_TABLET | Freq: Every day | ORAL | Status: DC

## 2013-08-05 NOTE — ED Notes (Signed)
Patient states he fell off step ladder yesterday morning. Patient has not tried OTC medication.

## 2013-08-05 NOTE — ED Provider Notes (Signed)
CSN: [id number]     Arrival date & time 08/05/13  1049 History   First MD Initiated Contact with Patient 08/05/13 1055     Chief Complaint  Patient presents with  . Hand Injury    HPI Hand injury x1 day. Patient reports that he was doing some work on a step ladder and fell off the ladder, landing on his left hand. Patient was approximately 8 feet up. Patient for significant left thumb/wrist pain and swelling since this point. No distal numbness or paresthesias. Has significantly decreased reassuring secondary to swelling and pain. Has not been able to use left hand secondary to pain. Baseline hx/o vascular disease s/p CVA and CAD with stents in place  1+ PPD smoker  No hemiparesis, cp, confusion prior to incident No CP, hemiparesis currently  On po dilaudid for chronic pain.    Past Medical History  Diagnosis Date  . Hypertension   . Hyperlipemia   . Hypothyroid   . Arthritis   . CAD (coronary artery disease)   . Stroke APRIL 06/2012  . Prinzmetal angina    Past Surgical History  Procedure Laterality Date  . Cardiac catheterization  06-13-2007 AND 10-08-2009  . Inguinal hernia repair Right 12/28/2012    Procedure: HERNIA REPAIR INGUINAL ADULT right with mesh ;  Surgeon: Lodema Pilot, DO;  Location: [facility];  Service: General;  Laterality: Right;  . Insertion of mesh Right 12/28/2012    Procedure: INSERTION OF MESH;  Surgeon: Lodema Pilot, DO;  Location: [facility];  Service: General;  Laterality: Right;  . Hernia repair  12/28/12    RIH repair   Family History  Problem Relation Age of Onset  . Coronary artery disease     History  Substance Use Topics  . Smoking status: Current Every Day Smoker -- 1.00 packs/day for 30 years    Types: Cigarettes  . Smokeless tobacco: Never Used  . Alcohol Use: Yes     Comment: OCCASIONAL BEER    Review of Systems  All other systems reviewed and are negative.    Allergies  Review of patient's allergies indicates no known  allergies.  Home Medications   Current Outpatient Rx  Name  Route  Sig  Dispense  Refill  . aspirin EC 81 MG tablet   Oral   Take 81 mg by mouth at bedtime.          . cyclobenzaprine (FLEXERIL) 10 MG tablet   Oral   Take 10 mg by mouth 3 (three) times daily as needed. For spasms         . fenofibrate (TRICOR) 145 MG tablet   Oral   Take 145 mg by mouth every morning.         Marland Kitchen HYDROmorphone (DILAUDID) 4 MG tablet   Oral   Take 1 tablet (4 mg total) by mouth every 4 (four) hours as needed for pain.   30 tablet   0   . levothyroxine (SYNTHROID, LEVOTHROID) 100 MCG tablet   Oral   Take 100 mcg by mouth every morning.          Marland Kitchen lisinopril (PRINIVIL,ZESTRIL) 5 MG tablet   Oral   Take 5 mg by mouth every morning.          . nitroGLYCERIN (NITRODUR - DOSED IN MG/24 HR) 0.2 mg/hr   Transdermal   Place 1 patch onto the skin daily as needed (for chest pain).          . EXPIRED:  nitroGLYCERIN (NITROSTAT) 0.3 MG SL tablet   Sublingual   Place 0.3 mg under the tongue every 5 (five) minutes as needed for chest pain.         . pantoprazole (PROTONIX) 40 MG tablet   Oral   Take 40 mg by mouth every morning.          . rosuvastatin (CRESTOR) 40 MG tablet   Oral   Take 40 mg by mouth every morning.           BP 124/75  Pulse 58  Temp(Src) 98.1 F (36.7 C) (Oral)  Ht 5\' 9"  (1.753 m)  Wt 179 lb (81.194 kg)  BMI 26.42 kg/m2  SpO2 97% Physical Exam  Constitutional: He appears well-developed and well-nourished.  HENT:  Head: Normocephalic and atraumatic.  Eyes: Conjunctivae are normal. Pupils are equal, round, and reactive to light.  Neck: Normal range of motion.  Cardiovascular: Normal rate and regular rhythm.   Pulmonary/Chest: Effort normal.  Abdominal: Soft.  Musculoskeletal:       Hands: L wrist/thumb radial sided swelling and pain  Decreased ROM 2/2 pain  Neurovascularly intact distally  + snuffbox TTP      Neurological: He is alert.   Skin: Skin is warm.    ED Course  Procedures (including critical care time) Labs Review Labs Reviewed - No data to display Imaging Review Dg Wrist Complete Left  08/05/2013   CLINICAL DATA:  Right thumb pain in the MCP region.  EXAM: LEFT WRIST - COMPLETE 3+ VIEW  COMPARISON:  None.  FINDINGS: There is no fracture or dislocation. There are carpal cysts within the lunate which is slightly increased in density. There are mild degenerative changes of the 1st MCP joint and 1st IP joint. The soft tissues are normal.  IMPRESSION: 1.  No acute osseous injury of the left wrist.  2. Multiple carpal cysts within the lunate with slight sclerosis diffusely of the lunate as can be seen with AVN. There is no collapse.   Electronically Signed   By: Elige Ko   On: 08/05/2013 11:48   Dg Finger Thumb Left  08/05/2013   CLINICAL DATA:  Pain at the 1st MCP joint  EXAM: LEFT THUMB 2+V  COMPARISON:  None.  FINDINGS: There is no fracture or dislocation. There is degenerative joint disease of the 1st IP joint. There are carpal cysts within the lunate.  IMPRESSION: No acute osseous injury of the left thumb.  Osteoarthritic changes of the 1st IP joint.   Electronically Signed   By: Elige Ko   On: 08/05/2013 11:42      MDM   1. Wrist injury, left, initial encounter    No acute fracture or dislocation on imaging.  Noted carpal cysts on imaging.  No pain over carpal tunnel/lunate distribution. May be incidental finding,.  Noted snuffbox tenderness  Will place in thumb spica splint Low dose NSAIDs (takes IBF on a regular basisper pt-cleared by cards per pt-both CVA and CAD both > 1 year s/p incident) Continue oral dilaudid. Will need to touch base with PCP/prescriber for any increase in dosing.  Follow up with sports medicine early next week  Discussed MSK and neurovascular red flags.  Discussed smoking cessation  Follow up as needed.    The patient and/or caregiver has been counseled thoroughly with  regard to treatment plan and/or medications prescribed including dosage, schedule, interactions, rationale for use, and possible side effects and they verbalize understanding. Diagnoses and expected course of recovery  discussed and will return if not improved as expected or if the condition worsens. Patient and/or caregiver verbalized understanding.           Doree Albee, MD 08/05/13 [phone]

## 2013-11-27 ENCOUNTER — Encounter: Payer: Self-pay | Admitting: Interventional Cardiology

## 2013-11-27 ENCOUNTER — Ambulatory Visit (INDEPENDENT_AMBULATORY_CARE_PROVIDER_SITE_OTHER): Payer: BC Managed Care – PPO | Admitting: Interventional Cardiology

## 2013-11-27 VITALS — BP 122/84 | HR 59 | Ht 69.0 in | Wt 183.8 lb

## 2013-11-27 DIAGNOSIS — I6350 Cerebral infarction due to unspecified occlusion or stenosis of unspecified cerebral artery: Secondary | ICD-10-CM

## 2013-11-27 DIAGNOSIS — I201 Angina pectoris with documented spasm: Secondary | ICD-10-CM

## 2013-11-27 DIAGNOSIS — I639 Cerebral infarction, unspecified: Secondary | ICD-10-CM

## 2013-11-27 DIAGNOSIS — I10 Essential (primary) hypertension: Secondary | ICD-10-CM

## 2013-11-27 DIAGNOSIS — E785 Hyperlipidemia, unspecified: Secondary | ICD-10-CM

## 2013-11-27 MED ORDER — NITROGLYCERIN 0.4 MG SL SUBL: 0.4000 mg | SUBLINGUAL_TABLET | SUBLINGUAL | Status: DC | PRN

## 2013-11-27 NOTE — Patient Instructions (Addendum)
Your physician recommends that you continue on your current medications as directed. Please refer to the Current Medication list given to you today.  Call the office if your chest pain worsens [phone]  You have a follow up appt scheduled with Dr.Varanassi on 02/02/14 @9 :15am

## 2013-11-27 NOTE — Progress Notes (Signed)
Patient ID: Carl [name], male   DOB: [birth year], 58 y.o.   MRN: [id number] Past Medical History  nonobstructive CAD   Prinzmetal's angina   Hyperlipidemia   Tobacco dependence   Mechanical low back pain   Hypothyroidism      [address], [address], [place]  [address] Phone: [phone] Fax:  [phone]  Date:  11/27/2013   ID:  Carl [name], DOB [birth year], MRN [id number]  PCP:  Florina Ou, MD   ASSESSMENT:  1. 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 Nonobstructive coronary artery disease 3. Hyperlipidemia 4. Hypertension  PLAN:  1. No change in therapy unless episodes of angina continue to increase in frequency or severity. At that point we will consider adding a long-acting nitrate or calcium channel blocker 2. Call earlier if significant change in pattern. Renew some little nitroglycerin. 3. Followup with Dr.Varanasi in 2 weeks   SUBJECTIVE: Carl [name] is a 58 y.o. male who complains of a 2 to  13-month history of increased episodes of nocturnal angina. Several times per month he awakens with chest discomfort and has to take up to 3 nitroglycerin tablets to get relief. He has no exertional component. He denies syncope. No palpitations. There is no history of dyspnea.   Wt Readings from Last 3 Encounters:  11/27/13 183 lb 12.8 oz (83.371 kg)  08/05/13 179 lb (81.194 kg)  01/19/13 178 lb 3.2 oz (80.831 kg)     Past Medical History  Diagnosis Date  . Hypertension   . Hyperlipemia   . Hypothyroid   . Arthritis   . CAD (coronary artery disease)   . Stroke APRIL 06/2012  . Prinzmetal angina     Current Outpatient Prescriptions  Medication Sig Dispense Refill  . aspirin EC 81 MG tablet Take 81 mg by mouth at bedtime.       . cyclobenzaprine (FLEXERIL) 10 MG tablet Take 10 mg by mouth 3 (three) times daily as needed. For spasms      . fenofibrate (TRICOR) 145 MG tablet Take 145 mg by mouth every morning.        Marland Kitchen HYDROmorphone (DILAUDID) 4 MG tablet Take 1 tablet (4 mg total) by mouth every 4 (four) hours as needed for pain.  30 tablet  0  . levothyroxine (SYNTHROID, LEVOTHROID) 100 MCG tablet Take 100 mcg by mouth every morning.       Marland Kitchen lisinopril (PRINIVIL,ZESTRIL) 5 MG tablet Take 5 mg by mouth every morning.       . meloxicam (MOBIC) 15 MG tablet Take 0.5 tablets (7.5 mg total) by mouth daily.  30 tablet  1  . nitroGLYCERIN (NITRODUR - DOSED IN MG/24 HR) 0.2 mg/hr Place 1 patch onto the skin daily as needed (for chest pain).       . nitroGLYCERIN (NITROSTAT) 0.3 MG SL tablet Place 0.3 mg under the tongue every 5 (five) minutes as needed for chest pain.      . pantoprazole (PROTONIX) 40 MG tablet Take 40 mg by mouth every morning.       . rosuvastatin (CRESTOR) 40 MG tablet Take 40 mg by mouth every morning.        No current facility-administered medications for this visit.    Allergies:   No Known Allergies  Social History:  The patient  reports that he has been smoking Cigarettes.  He has a 30 pack-year smoking history. He has never used smokeless tobacco. He reports that  he drinks alcohol. He reports that he does not use illicit drugs.   ROS:  Please see the history of present illness.   Appears healthy   All other systems reviewed and negative.   OBJECTIVE: VS:  BP 122/84  Pulse 59  Ht 5\' 9"  (1.753 m)  Wt 183 lb 12.8 oz (83.371 kg)  BMI 27.13 kg/m2  SpO2 97% Well nourished, well developed, in no acute distress, bearded and older appearing than stated age 52: normal Neck: JVD flat. Carotid bruit absent  Cardiac:  normal S1, S2; RRR; no murmur Lungs:  clear to auscultation bilaterally, no wheezing, rhonchi or rales Abd: soft, nontender, no hepatomegaly Ext: Edema absent. Pulses 2+ Skin: warm and dry Neuro:  CNs 2-12 intact, no focal abnormalities noted  EKG:  Sinus bradycardia with nonspecific inferior T-wave abnormality    Signed, Illene Labrador III, MD 11/27/2013 10:30  AM

## 2014-02-01 ENCOUNTER — Ambulatory Visit: Payer: BC Managed Care – PPO | Admitting: Interventional Cardiology

## 2014-02-26 ENCOUNTER — Ambulatory Visit (INDEPENDENT_AMBULATORY_CARE_PROVIDER_SITE_OTHER): Payer: BC Managed Care – PPO | Admitting: Interventional Cardiology

## 2014-02-26 ENCOUNTER — Encounter: Payer: Self-pay | Admitting: Interventional Cardiology

## 2014-02-26 ENCOUNTER — Encounter (INDEPENDENT_AMBULATORY_CARE_PROVIDER_SITE_OTHER): Payer: Self-pay

## 2014-02-26 VITALS — BP 117/63 | HR 59 | Ht 69.0 in | Wt 182.4 lb

## 2014-02-26 DIAGNOSIS — Z720 Tobacco use: Secondary | ICD-10-CM

## 2014-02-26 DIAGNOSIS — F17200 Nicotine dependence, unspecified, uncomplicated: Secondary | ICD-10-CM

## 2014-02-26 DIAGNOSIS — I201 Angina pectoris with documented spasm: Secondary | ICD-10-CM

## 2014-02-26 DIAGNOSIS — I10 Essential (primary) hypertension: Secondary | ICD-10-CM

## 2014-02-26 DIAGNOSIS — I2510 Atherosclerotic heart disease of native coronary artery without angina pectoris: Secondary | ICD-10-CM | POA: Insufficient documentation

## 2014-02-26 MED ORDER — NITROGLYCERIN 0.4 MG SL SUBL: 0.4000 mg | SUBLINGUAL_TABLET | SUBLINGUAL | Status: DC | PRN

## 2014-02-26 NOTE — Progress Notes (Signed)
Patient ID: Carl [name], male   DOB: Sep 27, 1955, 58 y.o.   MRN: [id number]    [address] [place], [place]  [address] Phone: [phone] Fax:  [phone]  Date:  02/26/2014   ID:  Carl [name], DOB 31-Jul-1956, MRN [id number]  PCP:  Florina Ou, MD      History of Present Illness: Carl [name] is a 58 y.o. male who had cardiac catheterization by Dr. Leonia Reeves many years ago. His most recent catheterization was in 2011 which revealed nonobstructive coronary disease as well as vasospasm. He was diagnosed with vasospastic angina. He has been treated with sublingual nitroglycerin. In 2014, he had hernia surgery. Prior to the surgery, he had a stress test. He was a left scan nuclear study which was negative for ischemia. Ejection fraction was 69%.  Since that time, he has done well. He occasionally uses nitroglycerin under his tongue. He is very physically active. His wife states that he is out in the yard and as had the tear down a shed. He states that he perspires heavily with his work. He has no chest discomfort with exertion.   Wt Readings from Last 3 Encounters:  02/26/14 182 lb 6.4 oz (82.736 kg)  11/27/13 183 lb 12.8 oz (83.371 kg)  08/05/13 179 lb (81.194 kg)     Past Medical History  Diagnosis Date  . Hypertension   . Hyperlipemia   . Hypothyroid   . Arthritis   . CAD (coronary artery disease)   . Stroke APRIL 06/2012  . Prinzmetal angina     Current Outpatient Prescriptions  Medication Sig Dispense Refill  . aspirin EC 81 MG tablet Take 81 mg by mouth at bedtime.       . cyclobenzaprine (FLEXERIL) 10 MG tablet Take 10 mg by mouth 3 (three) times daily as needed. For spasms      . fenofibrate (TRICOR) 145 MG tablet Take 145 mg by mouth every morning.      Marland Kitchen HYDROmorphone (DILAUDID) 4 MG tablet Take 1 tablet (4 mg total) by mouth every 4 (four) hours as needed for pain.  30 tablet  0  . levothyroxine (SYNTHROID, LEVOTHROID) 100 MCG tablet Take 100 mcg by  mouth every morning.       Marland Kitchen lisinopril (PRINIVIL,ZESTRIL) 5 MG tablet Take 5 mg by mouth every morning.       Marland Kitchen NEXIUM 40 MG capsule daily.      . nitroGLYCERIN (NITRODUR - DOSED IN MG/24 HR) 0.3 mg/hr patch Place 0.3 mg onto the skin as needed.      . nitroGLYCERIN (NITROSTAT) 0.4 MG SL tablet Place 1 tablet (0.4 mg total) under the tongue every 5 (five) minutes as needed for chest pain.  25 tablet  5  . rosuvastatin (CRESTOR) 40 MG tablet Take 40 mg by mouth every morning.        No current facility-administered medications for this visit.    Allergies:   No Known Allergies  Social History:  The patient  reports that he has been smoking Cigarettes.  He has a 30 pack-year smoking history. He has never used smokeless tobacco. He reports that he drinks alcohol. He reports that he does not use illicit drugs.   Family History:  The patient's family history includes Heart disease in his father and mother.   ROS:  Please see the history of present illness.  No nausea, vomiting.  No fevers, chills.  No focal weakness.  No dysuria.  All other systems reviewed and negative.   PHYSICAL EXAM: VS:  BP 117/63  Pulse 59  Ht 5\' 9"  (1.753 m)  Wt 182 lb 6.4 oz (82.736 kg)  BMI 26.92 kg/m2 Well nourished, well developed, in no acute distress HEENT: normal Neck: no JVD, no carotid bruits Cardiac:  normal S1, S2; RRR;  Lungs:  clear to auscultation bilaterally, no wheezing, rhonchi or rales Abd: soft, nontender, no hepatomegaly Ext: no edema Skin: warm and dry Neuro:   no focal abnormalities noted  EKG:       ASSESSMENT AND PLAN:  1.Tobacco dependence  Notes: He needs to stop smoking. This was discussed at length.  He will quit cold [place] if he quits. He is not interested in any type of aid to help him.  2. Prinzmetal angina   continue Nitro-Dur Patch 24 Hour, 0.2 MG/HR, aply to skin daily in am, remove at bedtime, Transdermal, prn, 30 days, 30, Refills 11 Notes: History of vasospasm. He  is using nitroglycerin patches. Refill sublingual nitroglycerin.  3. Coronary atherosclerosis of native coronary artery  Notes: Nonobstructive disease by prior catheterization.  Lipids managed by Dr. Modena Morrow. LDL target less than 100. Negative stress test in 2014. 4. HTN: Continue lisinopril. Well controlled  F/u in one year Signed, Mina Marble, MD, [facility] 02/26/2014 5:10 PM

## 2014-02-26 NOTE — Patient Instructions (Signed)
Your physician recommends that you continue on your current medications as directed. Please refer to the Current Medication list given to you today.  Your physician wants you to follow-up in: 1 year with Dr. Varanasi. You will receive a reminder letter in the mail two months in advance. If you don't receive a letter, please call our office to schedule the follow-up appointment.  

## 2014-03-29 IMAGING — CR DG CHEST 2V
1 series · 1 of 1 positions shown · non-contrast
Comparison: 10/08/2009.

CLINICAL DATA: Numbness left-sided body.  Smoker.

CHEST - 2 VIEW

[view not recorded]
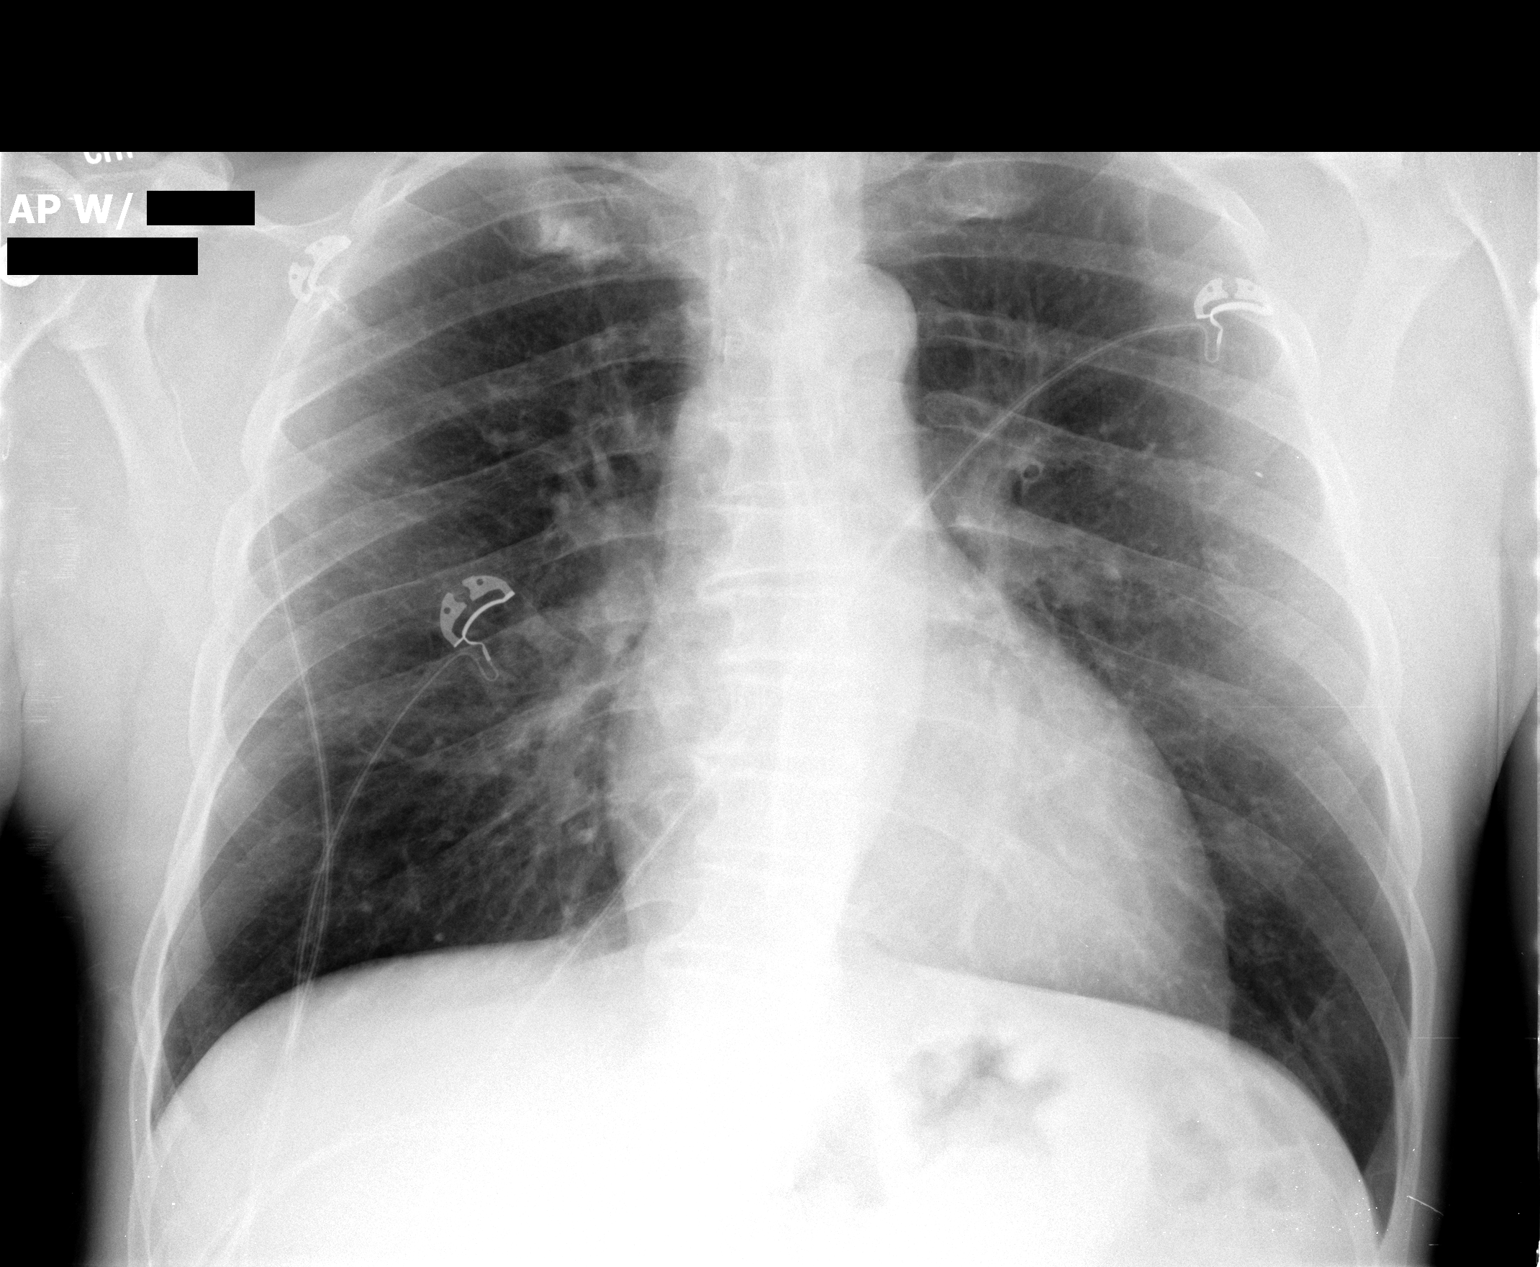

[1 of 1 positions shown; findings below may reference images not displayed]

FINDINGS: Chronic bronchitis type changes with peribronchial
thickening.

Calcified mildly tortuous aorta.

No infiltrate, congestive heart failure or pneumothorax..

Prominent sclerosis anterior aspect first rib unchanged.
IMPRESSION: Calcified mildly tortuous aorta.

Peribronchial thickening suggestive of chronic bronchitis type
changes unchanged.

## 2014-05-24 ENCOUNTER — Other Ambulatory Visit: Payer: Self-pay

## 2014-05-24 MED ORDER — NITROGLYCERIN 0.3 MG/HR TD PT24: 0.3000 mg | MEDICATED_PATCH | TRANSDERMAL | Status: DC | PRN

## 2014-06-01 ENCOUNTER — Other Ambulatory Visit: Payer: Self-pay

## 2014-06-01 DIAGNOSIS — I201 Angina pectoris with documented spasm: Secondary | ICD-10-CM

## 2014-06-01 MED ORDER — NITROGLYCERIN 0.4 MG SL SUBL: 0.4000 mg | SUBLINGUAL_TABLET | SUBLINGUAL | Status: DC | PRN

## 2014-06-01 MED ORDER — NITROGLYCERIN 0.3 MG/HR TD PT24: 0.3000 mg | MEDICATED_PATCH | TRANSDERMAL | Status: DC | PRN

## 2015-07-30 DIAGNOSIS — Z8679 Personal history of other diseases of the circulatory system: Secondary | ICD-10-CM | POA: Insufficient documentation

## 2015-07-30 DIAGNOSIS — Z8673 Personal history of transient ischemic attack (TIA), and cerebral infarction without residual deficits: Secondary | ICD-10-CM | POA: Insufficient documentation

## 2015-08-10 DIAGNOSIS — G894 Chronic pain syndrome: Secondary | ICD-10-CM | POA: Insufficient documentation

## 2015-10-31 DIAGNOSIS — K2100 Gastro-esophageal reflux disease with esophagitis, without bleeding: Secondary | ICD-10-CM | POA: Insufficient documentation

## 2015-11-08 ENCOUNTER — Encounter: Payer: Self-pay | Admitting: Physical Medicine & Rehabilitation

## 2015-11-08 ENCOUNTER — Encounter: Payer: Medicare HMO | Attending: Physical Medicine & Rehabilitation | Admitting: Physical Medicine & Rehabilitation

## 2015-11-08 VITALS — BP 120/67 | HR 53 | Resp 14

## 2015-11-08 DIAGNOSIS — Z5181 Encounter for therapeutic drug level monitoring: Secondary | ICD-10-CM | POA: Diagnosis not present

## 2015-11-08 DIAGNOSIS — Z79899 Other long term (current) drug therapy: Secondary | ICD-10-CM

## 2015-11-08 DIAGNOSIS — I69354 Hemiplegia and hemiparesis following cerebral infarction affecting left non-dominant side: Secondary | ICD-10-CM | POA: Insufficient documentation

## 2015-11-08 DIAGNOSIS — E785 Hyperlipidemia, unspecified: Secondary | ICD-10-CM | POA: Insufficient documentation

## 2015-11-08 DIAGNOSIS — R269 Unspecified abnormalities of gait and mobility: Secondary | ICD-10-CM

## 2015-11-08 DIAGNOSIS — E039 Hypothyroidism, unspecified: Secondary | ICD-10-CM | POA: Diagnosis not present

## 2015-11-08 DIAGNOSIS — M545 Low back pain: Secondary | ICD-10-CM | POA: Insufficient documentation

## 2015-11-08 DIAGNOSIS — F17210 Nicotine dependence, cigarettes, uncomplicated: Secondary | ICD-10-CM | POA: Insufficient documentation

## 2015-11-08 DIAGNOSIS — I10 Essential (primary) hypertension: Secondary | ICD-10-CM | POA: Diagnosis not present

## 2015-11-08 DIAGNOSIS — Z7982 Long term (current) use of aspirin: Secondary | ICD-10-CM | POA: Diagnosis not present

## 2015-11-08 DIAGNOSIS — I2510 Atherosclerotic heart disease of native coronary artery without angina pectoris: Secondary | ICD-10-CM | POA: Insufficient documentation

## 2015-11-08 DIAGNOSIS — G894 Chronic pain syndrome: Secondary | ICD-10-CM

## 2015-11-08 DIAGNOSIS — G8194 Hemiplegia, unspecified affecting left nondominant side: Secondary | ICD-10-CM | POA: Insufficient documentation

## 2015-11-08 DIAGNOSIS — M4690 Unspecified inflammatory spondylopathy, site unspecified: Secondary | ICD-10-CM | POA: Diagnosis not present

## 2015-11-08 DIAGNOSIS — M1990 Unspecified osteoarthritis, unspecified site: Secondary | ICD-10-CM | POA: Insufficient documentation

## 2015-11-08 DIAGNOSIS — Z720 Tobacco use: Secondary | ICD-10-CM

## 2015-11-08 NOTE — Progress Notes (Signed)
Subjective:    Patient ID: Carl [name], male    DOB: May 07, 1956, 60 y.o.   MRN: [id number]  HPI  60 y/o male with pmh of stroke with mild residual left sided weakness, angina, tobacco abuse, spinal arthritis presents for evaluation of low back pain.  It is located predormitally in his left lower back.  This has been present for 7-8 years and is stable.  He denies inciting events.  Heat improves the pain along with stretching and intermittent movement.  Cold, prolonged sitting/standing, and excessive ambulation exacerbate the pain.  He describes it as achy.  It is non-radiating.  It is intermittent.  Today, severity is  8/10.   He has had an MRI ~4 years ago, which showed arthritic changes (no trauma since this event).  [facility] tried ESIs vs. SI joint injections without benefit (made things worse).  Hydrocodone provided by PCP improves the pain.  He denies associated weakness, numbness, or tingling (some weakness from stroke).    Occasionally uses cane for ambulation.   He likes to stay activity.  The pain does not limit him from doing anything.    He also notes numbness in his b/l hands at night.    Pain Inventory Average Pain 10 Pain Right Now 8 My pain is aching  In the last 24 hours, has pain interfered with the following? General activity 8 Relation with others 3 Enjoyment of life 8 What TIME of day is your pain at its worst? evening Sleep (in general) Fair  Pain is worse with: standing and some activites Pain improves with: heat/ice and medication Relief from Meds: 8  Mobility walk without assistance use a cane use a walker how many minutes can you walk? 10 ability to climb steps?  yes do you drive?  yes  Function not employed: date last employed 2013 disabled: date disabled 2013  Neuro/Psych weakness numbness tremor tingling trouble walking spasms dizziness  Prior Studies Any changes since last visit?  no CT/MRI   Disk brought in  today  Physicians involved in your care Any changes since last visit?  no   Family History  Problem Relation Age of Onset  . Heart disease Mother   . Heart disease Father    Social History   Social History  . Marital Status: Married    Spouse Name: [name]  . Number of Children: [name]  . Years of Education: [name]   Social History Main Topics  . Smoking status: Current Every Day Smoker -- 1.00 packs/day for 30 years    Types: Cigarettes  . Smokeless tobacco: Never Used  . Alcohol Use: Yes     Comment: OCCASIONAL BEER  . Drug Use: No  . Sexual Activity: Not Asked   Other Topics Concern  . None   Social History Narrative   Past Surgical History  Procedure Laterality Date  . Cardiac catheterization  06-13-2007 AND 10-08-2009  . Inguinal hernia repair Right 12/28/2012    Procedure: HERNIA REPAIR INGUINAL ADULT right with mesh ;  Surgeon: Madilyn Hook, DO;  Location: [facility];  Service: General;  Laterality: Right;  . Insertion of mesh Right 12/28/2012    Procedure: INSERTION OF MESH;  Surgeon: Madilyn Hook, DO;  Location: [facility];  Service: General;  Laterality: Right;  . Hernia repair  12/28/12    RIH repair   Past Medical History  Diagnosis Date  . Hypertension   . Hyperlipemia   . Hypothyroid   .  Arthritis   . CAD (coronary artery disease)   . Stroke ([place]) APRIL 06/2012  . Prinzmetal angina ([facility])    BP 120/67 mmHg  Pulse 53  Resp 14  SpO2 99%  Opioid Risk Score:   Fall Risk Score:  `1  Depression screen PHQ 2/9  Depression screen PHQ 2/9 11/08/2015  Decreased Interest 0  Down, Depressed, Hopeless 0  PHQ - 2 Score 0  Altered sleeping 0  Tired, decreased energy 0  Change in appetite 0  Feeling bad or failure about yourself  0  Trouble concentrating 0  Moving slowly or fidgety/restless 0  Suicidal thoughts 0  PHQ-9 Score 0   Outpatient Encounter Prescriptions as of 11/08/2015  Medication Sig Note  . aspirin EC 81 MG tablet Take 81 mg by mouth at bedtime.    .  cyclobenzaprine (FLEXERIL) 10 MG tablet Take 10 mg by mouth 3 (three) times daily as needed. For spasms   . fenofibrate (TRICOR) 145 MG tablet Take 145 mg by mouth every morning.   Marland Kitchen HYDROcodone-acetaminophen (NORCO) 10-325 MG tablet Take 1 tablet by mouth 3 (three) times daily as needed. 11/08/2015: Did not bring today, N[facility]  Says last fill date was 09/10/2015 # 90/ per patient and [facility] pharmacy last fill date is  10/10/2015 #90  . levothyroxine (SYNTHROID, LEVOTHROID) 112 MCG tablet Take 1 tablet by mouth daily. 11/08/2015: Received from: [place]: Take by mouth.  Marland Kitchen lisinopril (PRINIVIL,ZESTRIL) 5 MG tablet Take 5 mg by mouth every morning.    Marland Kitchen NEXIUM 40 MG capsule daily. 02/26/2014: Received from: External Pharmacy Received Sig:   . nitroGLYCERIN (NITRODUR - DOSED IN MG/24 HR) 0.3 mg/hr patch Place 1 patch (0.3 mg total) onto the skin as needed.   . nitroGLYCERIN (NITROSTAT) 0.4 MG SL tablet Place 1 tablet (0.4 mg total) under the tongue every 5 (five) minutes as needed for chest pain.   Marland Kitchen omeprazole (PRILOSEC) 40 MG capsule Take 1 capsule by mouth daily. 11/08/2015: Received from: [place]: Take by mouth.  . rosuvastatin (CRESTOR) 40 MG tablet Take 40 mg by mouth every morning.    . [DISCONTINUED] HYDROmorphone (DILAUDID) 4 MG tablet Take 1 tablet (4 mg total) by mouth every 4 (four) hours as needed for pain.   . [DISCONTINUED] levothyroxine (SYNTHROID, LEVOTHROID) 100 MCG tablet Take 100 mcg by mouth every morning.     No facility-administered encounter medications on file as of 11/08/2015.    Review of Systems  Constitutional: Negative for activity change and unexpected weight change.  Gastrointestinal: Negative for constipation.       Denies bowel/bladder incontinence  Musculoskeletal: Positive for back pain and gait problem. Negative for neck pain.  Neurological: Positive for weakness. Negative for dizziness and syncope.  All other systems reviewed and are  negative.     Objective:   Physical Exam HENT: Normocephalic, Atraumatic Eyes: EOMI, Conj WNL Cardio: S1, S2 normal, RRR Pulm: B/l clear to auscultation.  Effort normal Abd: Soft, non-distended, non-tender, BS+ MSK: Gait slightly antalgic.   TTP right SI joint.    No edema.   Mild + FABERs b/l for hip pain.  Neg FADERs b/l Neuro: CN II-XII grossly intact.    Sensation intact to light touch in all LE dermatomes  Reflexes 1+ throughout  Neg SLR b/l  Strength  5/5 in RLE myotomes    4+5/5 in LLE myotomes Skin: Warm and Dry.     Assessment & Plan:  60 y/o male  with pmh of stroke with mild residual left sided weakness, angina, tobacco abuse, spinal arthritis presents for evaluation of low back pain.  1. Chronic mechanical low back pain  MRI from 4 years ago reviewed (images slightly distorted and blurry), suggesting arthritic changes.    Pt has failed ESIs vs. SI joint injection (will [facility])  States hydrocodone work for him, which he has been on for 7-8 years.   Encouraged pt to participate in pool therapy has he has a gym membership  Educated on smoking abstinence, pt states he can/will  Referral to PT  UDS obtained  Will consider [employer] in future  Will consider massage in the future  Will defer referral to Psychology for the time being   2. Abnormality of gait  Use cane as needed to avoid falls  3. Tobacco abuse  Educated pt on abstinence

## 2015-11-16 LAB — TOXASSURE SELECT,+ANTIDEPR,UR: PDF: 0

## 2015-11-21 NOTE — Progress Notes (Signed)
Urine drug screen for this encounter is inconsistent.  Positive for alcohol and unprescribed benzo alprazolam. N[facility]SR checked for 2 years and no RX disp for alprazolam.

## 2015-11-29 ENCOUNTER — Ambulatory Visit: Payer: Medicare HMO | Admitting: Physical Therapy

## 2015-12-20 ENCOUNTER — Ambulatory Visit: Payer: Medicare HMO | Admitting: Physical Medicine & Rehabilitation

## 2015-12-27 ENCOUNTER — Encounter: Payer: Medicare HMO | Attending: Physical Medicine & Rehabilitation | Admitting: Physical Medicine & Rehabilitation

## 2015-12-27 ENCOUNTER — Encounter: Payer: Self-pay | Admitting: Physical Medicine & Rehabilitation

## 2015-12-27 VITALS — BP 134/85 | HR 53 | Resp 14

## 2015-12-27 DIAGNOSIS — I2510 Atherosclerotic heart disease of native coronary artery without angina pectoris: Secondary | ICD-10-CM | POA: Insufficient documentation

## 2015-12-27 DIAGNOSIS — G8194 Hemiplegia, unspecified affecting left nondominant side: Secondary | ICD-10-CM | POA: Insufficient documentation

## 2015-12-27 DIAGNOSIS — F17210 Nicotine dependence, cigarettes, uncomplicated: Secondary | ICD-10-CM | POA: Insufficient documentation

## 2015-12-27 DIAGNOSIS — M1990 Unspecified osteoarthritis, unspecified site: Secondary | ICD-10-CM | POA: Insufficient documentation

## 2015-12-27 DIAGNOSIS — R269 Unspecified abnormalities of gait and mobility: Secondary | ICD-10-CM | POA: Diagnosis not present

## 2015-12-27 DIAGNOSIS — E785 Hyperlipidemia, unspecified: Secondary | ICD-10-CM | POA: Diagnosis not present

## 2015-12-27 DIAGNOSIS — M549 Dorsalgia, unspecified: Secondary | ICD-10-CM | POA: Diagnosis not present

## 2015-12-27 DIAGNOSIS — G5603 Carpal tunnel syndrome, bilateral upper limbs: Secondary | ICD-10-CM

## 2015-12-27 DIAGNOSIS — I69354 Hemiplegia and hemiparesis following cerebral infarction affecting left non-dominant side: Secondary | ICD-10-CM | POA: Diagnosis not present

## 2015-12-27 DIAGNOSIS — E039 Hypothyroidism, unspecified: Secondary | ICD-10-CM | POA: Diagnosis not present

## 2015-12-27 DIAGNOSIS — G8929 Other chronic pain: Secondary | ICD-10-CM

## 2015-12-27 DIAGNOSIS — M545 Low back pain: Secondary | ICD-10-CM | POA: Diagnosis present

## 2015-12-27 DIAGNOSIS — Z720 Tobacco use: Secondary | ICD-10-CM

## 2015-12-27 DIAGNOSIS — M4690 Unspecified inflammatory spondylopathy, site unspecified: Secondary | ICD-10-CM | POA: Insufficient documentation

## 2015-12-27 DIAGNOSIS — I10 Essential (primary) hypertension: Secondary | ICD-10-CM | POA: Diagnosis not present

## 2015-12-27 DIAGNOSIS — Z7982 Long term (current) use of aspirin: Secondary | ICD-10-CM | POA: Diagnosis not present

## 2015-12-27 MED ORDER — DULOXETINE HCL 60 MG PO CPEP: 60.0000 mg | ORAL_CAPSULE | Freq: Every day | ORAL | Status: DC

## 2015-12-27 MED ORDER — GABAPENTIN 300 MG PO CAPS: 300.0000 mg | ORAL_CAPSULE | Freq: Every day | ORAL | Status: DC

## 2015-12-27 NOTE — Patient Instructions (Signed)
Please obtain OTC b/l resting hand splints

## 2015-12-27 NOTE — Progress Notes (Signed)
Subjective:    Patient ID: Carl [name], male    DOB: 04-Aug-1956, 60 y.o.   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  It is located predominantly in his left lower back. This has been present for since ~2009. He denies inciting events. Heat improves the pain along with stretching and intermittent movement. Cold, prolonged sitting/standing, and excessive ambulation exacerbate the pain. He describes it as achy. It is non-radiating. It is intermittent. Today, severity is 4/10.  He has had an MRI ~2013, which showed arthritic changes (no trauma since this event) and bulging discs, per pt. [facility], who tried ESIs vs. SI joint injections without benefit (made things worse). Hydrocodone provided by PCP improves the pain. He denies associated weakness, numbness, or tingling (some weakness from stroke).  He does note numbness/tingling in his hands at night.  Occasionally uses cane for ambulation. He likes to stay activity. The pain does not limit him from doing anything.   On last visit, he was encouraged to participate in pool therapy, but he does not have the financial means.  He was encouraged to stop smoking, but he has not interest in smoking stating, "I'll quit when I want to and I don't want to".  He was referred to PT, but did not go.  He continues to use a cane at times.  Since last visit he states things are about the same.  He seems to have worse days after he has done some prolonged activity the previous day.    Pain Inventory Average Pain 8 Pain Right Now 4 My pain is aching  In the last 24 hours, has pain interfered with the following? General activity 6 Relation with others 0 Enjoyment of life 0 What TIME of day is your pain at its worst? morning, evening Sleep (in general) Fair  Pain is  worse with: bending and standing Pain improves with: rest and medication Relief from Meds: 6  Mobility walk without assistance how many minutes can you walk? 20 ability to climb steps?  yes do you drive?  yes  Function retired  Neuro/Psych trouble walking  Prior Studies Any changes since last visit?  no  Physicians involved in your care Any changes since last visit?  no   Family History  Problem Relation Age of Onset  . Heart disease Mother   . Heart disease Father    Social History   Social History  . Marital Status: Married    Spouse Name: [name]  . Number of Children: [name]  . Years of Education: [name]   Social History Main Topics  . Smoking status: Current Every Day Smoker -- 1.00 packs/day for 30 years    Types: Cigarettes  . Smokeless tobacco: Never Used  . Alcohol Use: Yes     Comment: OCCASIONAL BEER  . Drug Use: No  . Sexual Activity: Not Asked   Other Topics Concern  . None   Social History Narrative   Past Surgical History  Procedure Laterality Date  . Cardiac catheterization  06-13-2007 AND 10-08-2009  . Inguinal hernia repair Right 12/28/2012    Procedure: HERNIA REPAIR INGUINAL ADULT right with mesh ;  Surgeon: Madilyn Hook, DO;  Location: [facility];  Service: General;  Laterality: Right;  . Insertion of mesh Right 12/28/2012    Procedure: INSERTION OF  MESH;  Surgeon: Madilyn Hook, DO;  Location: [facility];  Service: General;  Laterality: Right;  . Hernia repair  12/28/12    RIH repair   Past Medical History  Diagnosis Date  . Hypertension   . Hyperlipemia   . Hypothyroid   . Arthritis   . CAD (coronary artery disease)   . Stroke ([place]) APRIL 06/2012  . 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  Neurological: Positive for weakness and numbness (At night in hands).  All other systems reviewed and are negative.     Objective:   Physical Exam HENT: Normocephalic, Atraumatic Eyes: EOMI, Conj WNL Cardio: S1, S2 normal, RRR Pulm: B/l clear to auscultation. Effort normal Abd: Soft, non-distended, non-tender, BS+ MSK: Gait slightly antalgic.  TTP right SI joint.  No edema.   Neg FABERs b/l for hip pain. Neg FADERs b/l  Neg Durkin's, Phalen's, Tinel's at wrist b/l Neuro: CN II-XII grossly intact.  Sensation intact to light touch in all LE dermatomes Reflexes 1+ throughout Neg SLR b/l Strength 5/5 in RLE myotomes 4+-5/5 in LLE myotomes Skin: Warm and Dry.     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  1. Chronic mechanical low back pain MRI from 4 years ago reviewed (images distorted and blurry), suggesting arthritic changes, per pt also suggesting 5 bulging discs.  Pt has failed ESIs vs. SI joint injection (requested information again from [facility]) States hydrocodone work for him, which he has been on for 7-8 years.  Encouraged pt to participate in pool therapy, but now he states that he does not have financial means Educated on smoking abstinence, but now pt states he is not interesting in quitting Referral to PT was made on last visit, but pt states he does not have financial means, he  states he is interested this time, will make referral for evaluation of pain, including TENs UDS inconsistent with Etoh and Benzos (not prescribed)  Cont heating pad  Will order Cymbalta 30mg , will increase to 60 in 2 weeks if no side effects Will consider Biowave in future Will defer referral to Psychology for the time being  Will consider massage in the future   2. Abnormality of gait  Denies falls Use cane as needed to avoid falls  3. Tobacco abuse Educated pt on abstinence  Pt not interested in quitting at this time  4. ?Mild CTS b/l  Encouraged trial of B/l resting hand splints at night  Will order gabapentin 300 qhs  Will consider NCS/EMG in future

## 2016-01-13 ENCOUNTER — Ambulatory Visit: Payer: Medicare HMO | Admitting: Physical Therapy

## 2016-04-24 ENCOUNTER — Ambulatory Visit: Payer: Medicare HMO | Admitting: Physical Medicine & Rehabilitation

## 2016-09-21 DIAGNOSIS — T300 Burn of unspecified body region, unspecified degree: Secondary | ICD-10-CM

## 2016-09-21 HISTORY — DX: Burn of unspecified body region, unspecified degree: T30.0

## 2016-12-24 ENCOUNTER — Encounter (HOSPITAL_COMMUNITY): Payer: Self-pay

## 2016-12-24 ENCOUNTER — Emergency Department (HOSPITAL_COMMUNITY)
Admission: EM | Admit: 2016-12-24 | Discharge: 2016-12-24 | Payer: PPO | Attending: Emergency Medicine | Admitting: Emergency Medicine

## 2016-12-24 ENCOUNTER — Emergency Department (HOSPITAL_COMMUNITY): Payer: PPO

## 2016-12-24 DIAGNOSIS — T2017XA Burn of first degree of neck, initial encounter: Secondary | ICD-10-CM | POA: Diagnosis not present

## 2016-12-24 DIAGNOSIS — T2114XA Burn of first degree of lower back, initial encounter: Secondary | ICD-10-CM | POA: Insufficient documentation

## 2016-12-24 DIAGNOSIS — E039 Hypothyroidism, unspecified: Secondary | ICD-10-CM | POA: Insufficient documentation

## 2016-12-24 DIAGNOSIS — Z7982 Long term (current) use of aspirin: Secondary | ICD-10-CM | POA: Insufficient documentation

## 2016-12-24 DIAGNOSIS — T2019XA Burn of first degree of multiple sites of head, face, and neck, initial encounter: Secondary | ICD-10-CM | POA: Diagnosis not present

## 2016-12-24 DIAGNOSIS — T2006XA Burn of unspecified degree of forehead and cheek, initial encounter: Secondary | ICD-10-CM | POA: Diagnosis not present

## 2016-12-24 DIAGNOSIS — Y92009 Unspecified place in unspecified non-institutional (private) residence as the place of occurrence of the external cause: Secondary | ICD-10-CM | POA: Diagnosis not present

## 2016-12-24 DIAGNOSIS — I10 Essential (primary) hypertension: Secondary | ICD-10-CM | POA: Diagnosis not present

## 2016-12-24 DIAGNOSIS — E785 Hyperlipidemia, unspecified: Secondary | ICD-10-CM | POA: Diagnosis not present

## 2016-12-24 DIAGNOSIS — T22012A Burn of unspecified degree of left forearm, initial encounter: Secondary | ICD-10-CM | POA: Diagnosis not present

## 2016-12-24 DIAGNOSIS — Z8673 Personal history of transient ischemic attack (TIA), and cerebral infarction without residual deficits: Secondary | ICD-10-CM | POA: Diagnosis not present

## 2016-12-24 DIAGNOSIS — X088XXA Exposure to other specified smoke, fire and flames, initial encounter: Secondary | ICD-10-CM | POA: Insufficient documentation

## 2016-12-24 DIAGNOSIS — T300 Burn of unspecified body region, unspecified degree: Secondary | ICD-10-CM

## 2016-12-24 DIAGNOSIS — T22011A Burn of unspecified degree of right forearm, initial encounter: Secondary | ICD-10-CM | POA: Diagnosis not present

## 2016-12-24 DIAGNOSIS — T23001A Burn of unspecified degree of right hand, unspecified site, initial encounter: Secondary | ICD-10-CM | POA: Diagnosis not present

## 2016-12-24 DIAGNOSIS — T310 Burns involving less than 10% of body surface: Secondary | ICD-10-CM | POA: Insufficient documentation

## 2016-12-24 DIAGNOSIS — T22212A Burn of second degree of left forearm, initial encounter: Secondary | ICD-10-CM | POA: Diagnosis not present

## 2016-12-24 DIAGNOSIS — T22211A Burn of second degree of right forearm, initial encounter: Secondary | ICD-10-CM | POA: Insufficient documentation

## 2016-12-24 DIAGNOSIS — T23002A Burn of unspecified degree of left hand, unspecified site, initial encounter: Secondary | ICD-10-CM | POA: Diagnosis not present

## 2016-12-24 DIAGNOSIS — F17210 Nicotine dependence, cigarettes, uncomplicated: Secondary | ICD-10-CM | POA: Diagnosis not present

## 2016-12-24 DIAGNOSIS — Y939 Activity, unspecified: Secondary | ICD-10-CM | POA: Diagnosis not present

## 2016-12-24 DIAGNOSIS — I2510 Atherosclerotic heart disease of native coronary artery without angina pectoris: Secondary | ICD-10-CM | POA: Insufficient documentation

## 2016-12-24 DIAGNOSIS — T2111XA Burn of first degree of chest wall, initial encounter: Secondary | ICD-10-CM | POA: Diagnosis not present

## 2016-12-24 DIAGNOSIS — G8911 Acute pain due to trauma: Secondary | ICD-10-CM | POA: Insufficient documentation

## 2016-12-24 DIAGNOSIS — Y999 Unspecified external cause status: Secondary | ICD-10-CM | POA: Diagnosis not present

## 2016-12-24 DIAGNOSIS — I517 Cardiomegaly: Secondary | ICD-10-CM | POA: Diagnosis not present

## 2016-12-24 DIAGNOSIS — T22219A Burn of second degree of unspecified forearm, initial encounter: Secondary | ICD-10-CM

## 2016-12-24 HISTORY — DX: Tobacco use: Z72.0

## 2016-12-24 LAB — CBC WITH DIFFERENTIAL/PLATELET
BASOS ABS: 0.1 10*3/uL (ref 0.0–0.1)
BASOS PCT: 1 %
EOS ABS: 0.2 10*3/uL (ref 0.0–0.7)
Eosinophils Relative: 2 %
HEMATOCRIT: 46.5 % (ref 39.0–52.0)
Hemoglobin: 16.2 g/dL (ref 13.0–17.0)
Lymphocytes Relative: 28 %
Lymphs Abs: 2.8 10*3/uL (ref 0.7–4.0)
MCH: 30.3 pg (ref 26.0–34.0)
MCHC: 34.8 g/dL (ref 30.0–36.0)
MCV: 87.1 fL (ref 78.0–100.0)
MONO ABS: 0.6 10*3/uL (ref 0.1–1.0)
MONOS PCT: 6 %
NEUTROS ABS: 6.5 10*3/uL (ref 1.7–7.7)
NEUTROS PCT: 63 %
Platelets: 231 10*3/uL (ref 150–400)
RBC: 5.34 MIL/uL (ref 4.22–5.81)
RDW: 12.8 % (ref 11.5–15.5)
WBC: 10.2 10*3/uL (ref 4.0–10.5)

## 2016-12-24 LAB — BASIC METABOLIC PANEL
ANION GAP: 12 (ref 5–15)
BUN: 18 mg/dL (ref 6–20)
CO2: 17 mmol/L — ABNORMAL LOW (ref 22–32)
CREATININE: 1.2 mg/dL (ref 0.61–1.24)
Calcium: 9.8 mg/dL (ref 8.9–10.3)
Chloride: 104 mmol/L (ref 101–111)
GFR calc Af Amer: >60 mL/min (ref >60)
Glucose, Bld: 107 mg/dL — ABNORMAL HIGH (ref 65–99)
Potassium: 4.1 mmol/L (ref 3.5–5.1)
SODIUM: 133 mmol/L — AB (ref 135–145)

## 2016-12-24 MED ORDER — FENTANYL CITRATE (PF) 100 MCG/2ML IJ SOLN
INTRAMUSCULAR | Status: AC
  Administered 2016-12-24: 100 ug
  Filled 2016-12-24: qty 2

## 2016-12-24 MED ORDER — HYDROMORPHONE HCL 1 MG/ML IJ SOLN
1.0000 mg | Freq: Once | INTRAMUSCULAR | Status: AC
  Administered 2016-12-24: 1 mg via INTRAVENOUS
  Filled 2016-12-24: qty 1

## 2016-12-24 MED ORDER — FENTANYL CITRATE (PF) 100 MCG/2ML IJ SOLN
100.0000 ug | INTRAMUSCULAR | Status: DC | PRN
  Administered 2016-12-24 (×2): 100 ug via INTRAVENOUS
  Filled 2016-12-24 (×2): qty 2
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
## 2016-12-24 NOTE — ED Notes (Signed)
Pt transferred to [facility] ED. Pt stable, VSS, NAD at time of departure.
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
## 2016-12-30 DIAGNOSIS — Z7409 Other reduced mobility: Secondary | ICD-10-CM | POA: Diagnosis not present

## 2016-12-30 DIAGNOSIS — G8929 Other chronic pain: Secondary | ICD-10-CM | POA: Insufficient documentation

## 2016-12-30 DIAGNOSIS — E039 Hypothyroidism, unspecified: Secondary | ICD-10-CM | POA: Diagnosis not present

## 2016-12-30 DIAGNOSIS — M545 Low back pain, unspecified: Secondary | ICD-10-CM | POA: Insufficient documentation

## 2016-12-30 DIAGNOSIS — I2510 Atherosclerotic heart disease of native coronary artery without angina pectoris: Secondary | ICD-10-CM | POA: Diagnosis not present

## 2016-12-30 DIAGNOSIS — K210 Gastro-esophageal reflux disease with esophagitis: Secondary | ICD-10-CM | POA: Diagnosis not present

## 2016-12-30 DIAGNOSIS — R001 Bradycardia, unspecified: Secondary | ICD-10-CM | POA: Diagnosis not present

## 2016-12-30 DIAGNOSIS — E785 Hyperlipidemia, unspecified: Secondary | ICD-10-CM | POA: Diagnosis not present

## 2016-12-30 DIAGNOSIS — I10 Essential (primary) hypertension: Secondary | ICD-10-CM | POA: Diagnosis not present

## 2016-12-30 DIAGNOSIS — T22212A Burn of second degree of left forearm, initial encounter: Secondary | ICD-10-CM | POA: Diagnosis not present

## 2016-12-30 DIAGNOSIS — T23201A Burn of second degree of right hand, unspecified site, initial encounter: Secondary | ICD-10-CM | POA: Diagnosis not present

## 2016-12-30 DIAGNOSIS — T23202A Burn of second degree of left hand, unspecified site, initial encounter: Secondary | ICD-10-CM | POA: Diagnosis not present

## 2016-12-30 DIAGNOSIS — T23271A Burn of second degree of right wrist, initial encounter: Secondary | ICD-10-CM | POA: Diagnosis not present

## 2016-12-30 DIAGNOSIS — X088XXA Exposure to other specified smoke, fire and flames, initial encounter: Secondary | ICD-10-CM | POA: Diagnosis not present

## 2016-12-30 DIAGNOSIS — F17210 Nicotine dependence, cigarettes, uncomplicated: Secondary | ICD-10-CM | POA: Diagnosis not present

## 2016-12-30 DIAGNOSIS — Y92810 Car as the place of occurrence of the external cause: Secondary | ICD-10-CM | POA: Diagnosis not present

## 2016-12-30 DIAGNOSIS — T23292A Burn of second degree of multiple sites of left wrist and hand, initial encounter: Secondary | ICD-10-CM | POA: Diagnosis not present

## 2016-12-30 DIAGNOSIS — T23261A Burn of second degree of back of right hand, initial encounter: Secondary | ICD-10-CM | POA: Diagnosis not present

## 2016-12-30 DIAGNOSIS — I209 Angina pectoris, unspecified: Secondary | ICD-10-CM | POA: Insufficient documentation

## 2016-12-30 DIAGNOSIS — T23262A Burn of second degree of back of left hand, initial encounter: Secondary | ICD-10-CM | POA: Diagnosis not present

## 2016-12-30 DIAGNOSIS — T23291A Burn of second degree of multiple sites of right wrist and hand, initial encounter: Secondary | ICD-10-CM | POA: Diagnosis not present

## 2016-12-30 DIAGNOSIS — I25119 Atherosclerotic heart disease of native coronary artery with unspecified angina pectoris: Secondary | ICD-10-CM | POA: Diagnosis not present

## 2016-12-30 DIAGNOSIS — Z8673 Personal history of transient ischemic attack (TIA), and cerebral infarction without residual deficits: Secondary | ICD-10-CM | POA: Diagnosis not present

## 2016-12-30 DIAGNOSIS — T310 Burns involving less than 10% of body surface: Secondary | ICD-10-CM | POA: Diagnosis not present

## 2016-12-30 DIAGNOSIS — T22299A Burn of second degree of multiple sites of unspecified shoulder and upper limb, except wrist and hand, initial encounter: Secondary | ICD-10-CM | POA: Insufficient documentation

## 2016-12-30 DIAGNOSIS — T2027XA Burn of second degree of neck, initial encounter: Secondary | ICD-10-CM | POA: Diagnosis not present

## 2016-12-30 DIAGNOSIS — Z7982 Long term (current) use of aspirin: Secondary | ICD-10-CM | POA: Diagnosis not present

## 2016-12-30 DIAGNOSIS — I479 Paroxysmal tachycardia, unspecified: Secondary | ICD-10-CM | POA: Diagnosis not present

## 2016-12-30 DIAGNOSIS — T22292A Burn of second degree of multiple sites of left shoulder and upper limb, except wrist and hand, initial encounter: Secondary | ICD-10-CM | POA: Diagnosis not present

## 2016-12-30 MED FILL — Hydromorphone HCl Inj 2 MG/ML: INTRAMUSCULAR | Qty: 1 | Status: AC

## 2016-12-31 DIAGNOSIS — I10 Essential (primary) hypertension: Secondary | ICD-10-CM | POA: Diagnosis not present

## 2016-12-31 DIAGNOSIS — T23202A Burn of second degree of left hand, unspecified site, initial encounter: Secondary | ICD-10-CM | POA: Diagnosis not present

## 2016-12-31 DIAGNOSIS — T23201A Burn of second degree of right hand, unspecified site, initial encounter: Secondary | ICD-10-CM | POA: Diagnosis not present

## 2016-12-31 DIAGNOSIS — K219 Gastro-esophageal reflux disease without esophagitis: Secondary | ICD-10-CM | POA: Diagnosis not present

## 2017-01-01 DIAGNOSIS — T2220XA Burn of second degree of shoulder and upper limb, except wrist and hand, unspecified site, initial encounter: Secondary | ICD-10-CM | POA: Diagnosis not present

## 2017-01-01 DIAGNOSIS — T23202A Burn of second degree of left hand, unspecified site, initial encounter: Secondary | ICD-10-CM | POA: Diagnosis not present

## 2017-01-01 DIAGNOSIS — T310 Burns involving less than 10% of body surface: Secondary | ICD-10-CM | POA: Diagnosis not present

## 2017-01-01 DIAGNOSIS — T23201A Burn of second degree of right hand, unspecified site, initial encounter: Secondary | ICD-10-CM | POA: Diagnosis not present

## 2017-01-08 DIAGNOSIS — T22299A Burn of second degree of multiple sites of unspecified shoulder and upper limb, except wrist and hand, initial encounter: Secondary | ICD-10-CM | POA: Diagnosis not present

## 2017-01-08 DIAGNOSIS — X088XXD Exposure to other specified smoke, fire and flames, subsequent encounter: Secondary | ICD-10-CM | POA: Diagnosis not present

## 2017-01-08 DIAGNOSIS — T22292D Burn of second degree of multiple sites of left shoulder and upper limb, except wrist and hand, subsequent encounter: Secondary | ICD-10-CM | POA: Diagnosis not present

## 2017-01-27 DIAGNOSIS — R739 Hyperglycemia, unspecified: Secondary | ICD-10-CM | POA: Diagnosis not present

## 2017-01-27 DIAGNOSIS — G894 Chronic pain syndrome: Secondary | ICD-10-CM | POA: Diagnosis not present

## 2017-01-27 DIAGNOSIS — E039 Hypothyroidism, unspecified: Secondary | ICD-10-CM | POA: Diagnosis not present

## 2017-01-27 DIAGNOSIS — E782 Mixed hyperlipidemia: Secondary | ICD-10-CM | POA: Diagnosis not present

## 2017-01-27 DIAGNOSIS — I10 Essential (primary) hypertension: Secondary | ICD-10-CM | POA: Diagnosis not present

## 2017-02-05 DIAGNOSIS — T23201D Burn of second degree of right hand, unspecified site, subsequent encounter: Secondary | ICD-10-CM | POA: Diagnosis not present

## 2017-02-05 DIAGNOSIS — T23269A Burn of second degree of back of unspecified hand, initial encounter: Secondary | ICD-10-CM | POA: Diagnosis not present

## 2017-02-05 DIAGNOSIS — T22299S Burn of second degree of multiple sites of unspecified shoulder and upper limb, except wrist and hand, sequela: Secondary | ICD-10-CM | POA: Diagnosis not present

## 2017-02-05 DIAGNOSIS — T310 Burns involving less than 10% of body surface: Secondary | ICD-10-CM | POA: Diagnosis not present

## 2017-02-05 DIAGNOSIS — T2220XD Burn of second degree of shoulder and upper limb, except wrist and hand, unspecified site, subsequent encounter: Secondary | ICD-10-CM | POA: Diagnosis not present

## 2017-02-05 DIAGNOSIS — T23202D Burn of second degree of left hand, unspecified site, subsequent encounter: Secondary | ICD-10-CM | POA: Diagnosis not present

## 2017-02-05 DIAGNOSIS — T22212A Burn of second degree of left forearm, initial encounter: Secondary | ICD-10-CM | POA: Diagnosis not present

## 2017-02-05 DIAGNOSIS — X088XXD Exposure to other specified smoke, fire and flames, subsequent encounter: Secondary | ICD-10-CM | POA: Diagnosis not present

## 2017-07-09 DIAGNOSIS — M545 Low back pain: Secondary | ICD-10-CM | POA: Diagnosis not present

## 2017-07-09 DIAGNOSIS — M25551 Pain in right hip: Secondary | ICD-10-CM | POA: Diagnosis not present

## 2017-07-09 DIAGNOSIS — M5441 Lumbago with sciatica, right side: Secondary | ICD-10-CM | POA: Diagnosis not present

## 2017-07-09 DIAGNOSIS — R29898 Other symptoms and signs involving the musculoskeletal system: Secondary | ICD-10-CM | POA: Diagnosis not present

## 2017-07-09 DIAGNOSIS — G8929 Other chronic pain: Secondary | ICD-10-CM | POA: Diagnosis not present

## 2017-07-12 DIAGNOSIS — M25551 Pain in right hip: Secondary | ICD-10-CM | POA: Diagnosis not present

## 2017-07-19 ENCOUNTER — Emergency Department (HOSPITAL_COMMUNITY)
Admission: EM | Admit: 2017-07-19 | Discharge: 2017-07-19 | Disposition: A | Discharge status: Home / Self Care | Payer: PPO | Attending: Emergency Medicine | Admitting: Emergency Medicine

## 2017-07-19 ENCOUNTER — Encounter (HOSPITAL_COMMUNITY): Payer: Self-pay | Admitting: Emergency Medicine

## 2017-07-19 ENCOUNTER — Emergency Department (HOSPITAL_COMMUNITY): Payer: PPO

## 2017-07-19 DIAGNOSIS — F17210 Nicotine dependence, cigarettes, uncomplicated: Secondary | ICD-10-CM | POA: Diagnosis not present

## 2017-07-19 DIAGNOSIS — E039 Hypothyroidism, unspecified: Secondary | ICD-10-CM | POA: Diagnosis not present

## 2017-07-19 DIAGNOSIS — Z7982 Long term (current) use of aspirin: Secondary | ICD-10-CM | POA: Insufficient documentation

## 2017-07-19 DIAGNOSIS — I2510 Atherosclerotic heart disease of native coronary artery without angina pectoris: Secondary | ICD-10-CM | POA: Insufficient documentation

## 2017-07-19 DIAGNOSIS — M545 Low back pain: Secondary | ICD-10-CM | POA: Diagnosis not present

## 2017-07-19 DIAGNOSIS — I10 Essential (primary) hypertension: Secondary | ICD-10-CM | POA: Insufficient documentation

## 2017-07-19 DIAGNOSIS — M5431 Sciatica, right side: Secondary | ICD-10-CM | POA: Insufficient documentation

## 2017-07-19 DIAGNOSIS — Z8673 Personal history of transient ischemic attack (TIA), and cerebral infarction without residual deficits: Secondary | ICD-10-CM | POA: Insufficient documentation

## 2017-07-19 DIAGNOSIS — Z79899 Other long term (current) drug therapy: Secondary | ICD-10-CM | POA: Diagnosis not present

## 2017-07-19 DIAGNOSIS — M5441 Lumbago with sciatica, right side: Secondary | ICD-10-CM | POA: Diagnosis not present

## 2017-07-19 LAB — CBC
HCT: 46 % (ref 39.0–52.0)
Hemoglobin: 15.3 g/dL (ref 13.0–17.0)
MCH: 30 pg (ref 26.0–34.0)
MCHC: 33.3 g/dL (ref 30.0–36.0)
MCV: 90.2 fL (ref 78.0–100.0)
Platelets: 224 10*3/uL (ref 150–400)
RBC: 5.1 MIL/uL (ref 4.22–5.81)
RDW: 14.2 % (ref 11.5–15.5)
WBC: 10.6 10*3/uL — ABNORMAL HIGH (ref 4.0–10.5)

## 2017-07-19 LAB — BASIC METABOLIC PANEL
Anion gap: 8 (ref 5–15)
BUN: 25 mg/dL — ABNORMAL HIGH (ref 6–20)
CO2: 22 mmol/L (ref 22–32)
CREATININE: 1.16 mg/dL (ref 0.61–1.24)
Calcium: 9.2 mg/dL (ref 8.9–10.3)
Chloride: 103 mmol/L (ref 101–111)
GFR calc non Af Amer: >60 mL/min (ref >60)
GLUCOSE: 106 mg/dL — AB (ref 65–99)
Potassium: 3.7 mmol/L (ref 3.5–5.1)
Sodium: 133 mmol/L — ABNORMAL LOW (ref 135–145)

## 2017-07-19 LAB — URINALYSIS, ROUTINE W REFLEX MICROSCOPIC
BILIRUBIN URINE: NEGATIVE
Glucose, UA: NEGATIVE mg/dL
Hgb urine dipstick: NEGATIVE
KETONES UR: NEGATIVE mg/dL
Leukocytes, UA: NEGATIVE
NITRITE: NEGATIVE
PROTEIN: NEGATIVE mg/dL
Specific Gravity, Urine: 1.017 (ref 1.005–1.030)
pH: 5 (ref 5.0–8.0)

## 2017-07-19 MED ORDER — METHYLPREDNISOLONE 4 MG PO TBPK: ORAL_TABLET | ORAL | 0 refills | Status: DC

## 2017-07-19 MED ORDER — KETOROLAC TROMETHAMINE 30 MG/ML IJ SOLN
30.0000 mg | Freq: Once | INTRAMUSCULAR | Status: AC
  Administered 2017-07-19: 30 mg via INTRAMUSCULAR
  Filled 2017-07-19: qty 1

## 2017-07-19 MED ORDER — HYDROCODONE-ACETAMINOPHEN 10-325 MG PO TABS: 1.0000 | ORAL_TABLET | Freq: Three times a day (TID) | ORAL | 0 refills | Status: DC | PRN

## 2017-07-19 MED ORDER — DIAZEPAM 5 MG PO TABS
5.0000 mg | ORAL_TABLET | Freq: Once | ORAL | Status: AC
  Administered 2017-07-19: 5 mg via ORAL
  Filled 2017-07-19: qty 1

## 2017-07-19 MED ORDER — OXYCODONE-ACETAMINOPHEN 5-325 MG PO TABS
1.0000 | ORAL_TABLET | Freq: Once | ORAL | Status: AC
  Administered 2017-07-19: 1 via ORAL

## 2017-07-19 MED ORDER — HYDROMORPHONE HCL 1 MG/ML IJ SOLN
1.0000 mg | Freq: Once | INTRAMUSCULAR | Status: AC
  Administered 2017-07-19: 1 mg via INTRAMUSCULAR
  Filled 2017-07-19: qty 1

## 2017-07-19 MED ORDER — METHOCARBAMOL 500 MG PO TABS: 500.0000 mg | ORAL_TABLET | Freq: Two times a day (BID) | ORAL | 0 refills | Status: DC

## 2017-07-19 MED ORDER — OXYCODONE-ACETAMINOPHEN 5-325 MG PO TABS
ORAL_TABLET | ORAL | Status: AC
  Filled 2017-07-19: qty 1

## 2017-07-19 NOTE — ED Triage Notes (Signed)
Pt having right flank pain 10/10 for the past 2 weeks going all the way to his right leg, pt denies any urinary symptoms, no fever or chills.

## 2017-07-19 NOTE — ED Notes (Signed)
Patient transported to MRI 

## 2017-07-19 NOTE — ED Notes (Signed)
Pt presents with a shooting pain from his buttocks down his right leg. Pt reports this has been going on for 2 weeks.

## 2017-07-19 NOTE — ED Provider Notes (Signed)
10:23 AM I discussed the MRI results with the patient and his wife, and with his daughter-in-law who is an Glass blower/designer. Patient remains in similar condition as prior exam. We discussed need for outpatient follow-up with either orthopedics or spine specialist. Patient will receive short course of steroids, analgesia, follow-up instructions.   Carmin Muskrat, MD 07/19/17 1024

## 2017-07-19 NOTE — ED Notes (Signed)
Patient returned from MRI.

## 2017-07-19 NOTE — ED Notes (Signed)
Bladder scan-208 ml

## 2017-07-19 NOTE — Discharge Instructions (Addendum)
As discussed, today's evaluation has been generally reassuring, though there is evidence for inflammation in your lower back, likely contributing to your leg pain, as in sciatica. Please be sure to help with your primary care physician and either 1 of the specialists to whom you have been referred.  Return here for concerning changes in your condition.

## 2017-07-19 NOTE — ED Notes (Signed)
Bladder scan 208 mL in bladder

## 2017-07-19 NOTE — ED Provider Notes (Signed)
[facility] EMERGENCY DEPARTMENT Provider Note   CSN: [id number] Arrival date & time: 07/19/17  0459     History   Chief Complaint Chief Complaint  Patient presents with  . Flank Pain    HPI Carl [name] is a 61 y.o. male.  Patient presents with 2 weeks of right-sided low back and leg pain.  Denies any falls or trauma.  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  He saw his PCP last week and was told he had a pinched nerve in his back and was given a course of steroids and Zanaflex.  He had an x-ray that was negative.  He is trying to get approved for an MRI.  He states the steroids did not help and has been taking ibuprofen with no relief.  The pain is severe and prevents him from sleeping.  Denies any focal weakness, numbness or tingling.  No bowel or bladder incontinence.  No pain with urination or hematuria.  No testicular pain.  Denies any fever or vomiting.  No history of cancer.   The history is provided by the patient.  Flank Pain  Pertinent negatives include no chest pain, no abdominal pain, no headaches and no shortness of breath.    Past Medical History:  Diagnosis Date  . Arthritis   . CAD (coronary artery disease)   . Hyperlipemia   . Hypertension   . Hypothyroid   . Prinzmetal angina ([place])   . Stroke ([place]) APRIL 06/2012  . Tobacco abuse     Patient Active Problem List   Diagnosis Date Noted  . Tobacco abuse 02/26/2014  . Coronary atherosclerosis of native coronary artery 02/26/2014  . 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[place]) 11/27/2013  . Bradycardia 01/01/2012  . CVA (cerebral vascular accident) ([place]) 12/30/2011  . Hypertension 12/30/2011  . Dyslipidemia 12/30/2011  . Hypothyroidism 12/30/2011    Past Surgical History:  Procedure Laterality Date  . CARDIAC CATHETERIZATION  06-13-2007 AND 10-08-2009  . HERNIA REPAIR  12/28/12   RIH repair  . INGUINAL HERNIA REPAIR Right  12/28/2012   Procedure: HERNIA REPAIR INGUINAL ADULT right with mesh ;  Surgeon: Madilyn Hook, DO;  Location[facility];  Service: General;  Laterality: Right;  . INSERTION OF MESH Right 12/28/2012   Procedure: INSERTION OF MESH;  Surgeon: Madilyn Hook, DO;  Location: [facility];  Service: General;  Laterality: Right;       Home Medications    Prior to Admission medications   Medication Sig Start Date End Date Taking? Authorizing Provider  aspirin EC 81 MG tablet Take 81 mg by mouth at bedtime.     [provider]  cyclobenzaprine (FLEXERIL) 10 MG tablet Take 10 mg by mouth 3 (three) times daily as needed. For spasms    [provider]  DULoxetine (CYMBALTA) 60 MG capsule Take 1 capsule (60 mg total) by mouth daily. 12/27/15   Jamse Arn, MD  fenofibrate (TRICOR) 145 MG tablet Take 145 mg by mouth every morning.    [provider]  gabapentin (NEURONTIN) 300 MG capsule Take 1 capsule (300 mg total) by mouth at bedtime. 12/27/15   Jamse Arn, MD  HYDROcodone-acetaminophen (NORCO) 10-325 MG tablet Take 1 tablet by mouth 3 (three) times daily as needed. 10/10/15   [provider]  levothyroxine (SYNTHROID, LEVOTHROID) 112 MCG tablet Take 1 tablet by mouth daily.    [provider]  lisinopril (PRINIVIL,ZESTRIL) 5 MG tablet Take 5 mg by mouth every morning.     [provider]  NEXIUM 40 MG capsule daily. 02/17/14   [provider]  nitroGLYCERIN (NITRODUR - DOSED IN MG/24 HR) 0.3 mg/hr patch Place 1 patch (0.3 mg total) onto the skin as needed. 06/01/14   Jettie Booze, MD  nitroGLYCERIN (NITROSTAT) 0.4 MG SL tablet Place 1 tablet (0.4 mg total) under the tongue every 5 (five) minutes as needed for chest pain. 06/01/14   Jettie Booze, MD  omeprazole (PRILOSEC) 40 MG capsule Take 1 capsule by mouth daily.    [provider]  rosuvastatin (CRESTOR) 40 MG tablet Take 40 mg by mouth every morning.     [provider]    Family History Family History  Problem Relation Age of Onset  . Heart disease Mother   . Heart disease Father     Social History Social History  Substance Use Topics  . Smoking status: Current Every Day Smoker    Packs/day: 1.00    Years: 30.00    Types: Cigarettes  . Smokeless tobacco: Never Used  . Alcohol use Yes     Comment: OCCASIONAL BEER     Allergies   Patient has no known allergies.   Review of Systems Review of Systems  Constitutional: Negative for activity change, appetite change and fever.  HENT: Negative for congestion and rhinorrhea.   Eyes: Negative for visual disturbance.  Respiratory: Negative for cough, chest tightness and shortness of breath.   Cardiovascular: Negative for chest pain.  Gastrointestinal: Negative for abdominal pain, nausea and vomiting.  Genitourinary: Positive for flank pain. Negative for urgency.  Musculoskeletal: Positive for arthralgias, back pain and myalgias.  Skin: Negative for rash.  Neurological: Negative for dizziness, weakness and headaches.   all other systems are negative except as noted in the HPI and PMH.     Physical Exam Updated Vital Signs BP (!) 155/100 (BP Location: Right Arm)   Pulse 67   Temp 98.3 F (36.8 C) (Oral)   Resp 18   Ht 5\' 9"  (1.753 m)   Wt 85.7 kg (189 lb)   SpO2 100%   BMI 27.91 kg/m   Physical Exam  Constitutional: He is oriented to person, place, and time. He appears well-developed and well-nourished. No distress.  HENT:  Head: Normocephalic and atraumatic.  Mouth/Throat: Oropharynx is clear and moist. No oropharyngeal exudate.  Eyes: Pupils are equal, round, and reactive to light. Conjunctivae and EOM are normal.  Neck: Normal range of motion. Neck supple.  No meningismus.  Cardiovascular: Normal rate, regular rhythm, normal heart sounds and intact distal pulses.   No murmur heard. Pulmonary/Chest: Effort normal and breath sounds normal. No respiratory distress.   Abdominal: Soft. There is no tenderness. There is no rebound and no guarding.  Musculoskeletal: Normal range of motion. He exhibits tenderness. He exhibits no edema.  No midline lumbar pain.  No paraspinal lumbar pain.  5/5 strength in bilateral lower extremities. Ankle plantar and dorsiflexion intact. Great toe extension intact bilaterally. +2 DP and PT pulses. +2 patellar reflexes on left. Unable to elicit patellar reflex on R.  Normal gait.   Neurological: He is alert and oriented to person, place, and time. No cranial nerve deficit. He exhibits normal muscle tone. Coordination normal.   5/5 strength throughout. CN 2-12 intact.Equal grip strength.   Skin: Skin is warm. Capillary refill takes less than 2 seconds. No rash noted.  Psychiatric: He has a normal mood and affect. His behavior is normal.  Nursing note and vitals reviewed.    ED Treatments / Results  Labs (all labs ordered are listed, but only abnormal results are displayed) Labs Reviewed  BASIC METABOLIC PANEL - Abnormal; Notable for the following:       Result Value   Sodium 133 (*)    Glucose, Bld 106 (*)    BUN 25 (*)    All other components within normal limits  CBC - Abnormal; Notable for the following:    WBC 10.6 (*)    All other components within normal limits  URINALYSIS, ROUTINE W REFLEX MICROSCOPIC    EKG  EKG Interpretation None       Radiology No results found.  Procedures Procedures (including critical care time)  Medications Ordered in ED Medications  HYDROmorphone (DILAUDID) injection 1 mg (not administered)  ketorolac (TORADOL) 30 MG/ML injection 30 mg (not administered)  diazepam (VALIUM) tablet 5 mg (not administered)  oxyCODONE-acetaminophen (PERCOCET/ROXICET) 5-325 MG per tablet 1 tablet (1 tablet Oral Given 07/19/17 0513)     Initial Impression / Assessment and Plan / ED Course  I have reviewed the triage vital signs and the nursing notes.  Pertinent labs & imaging results  that were available during my care of the patient were reviewed by me and considered in my medical decision making (see chart for details).    Patient with 2 weeks of sciatica type symptoms with pain in his right leg that radiates down from his low back.  No focal weakness, numbness or tingling.  No bowel or bladder incontinence.  Exam and presentation consistent with sciatica.  Doubt cord compression or cauda equina.  No red flags on exam  Patient treated in the ED for sciatica.  Bladder scan postvoid residual shows 208 mL.  Patient was able to ambulate with some difficulty.  He is requesting MRI discussed with patient that this may not be covered by insurance due to the emergency nature of his visit. He is agreeable. Anticipate discharge home with treatment for sciatica PCP as well as neurosurgery follow-up.  Final Clinical Impressions(s) / ED Diagnoses   Final diagnoses:  None    New Prescriptions New Prescriptions   No medications on file     Ezequiel Essex, MD 07/19/17 [phone]

## 2017-07-21 DIAGNOSIS — I739 Peripheral vascular disease, unspecified: Secondary | ICD-10-CM | POA: Diagnosis not present

## 2017-07-22 DIAGNOSIS — M79604 Pain in right leg: Secondary | ICD-10-CM | POA: Diagnosis not present

## 2017-07-22 DIAGNOSIS — G8929 Other chronic pain: Secondary | ICD-10-CM | POA: Diagnosis not present

## 2017-07-22 DIAGNOSIS — M5441 Lumbago with sciatica, right side: Secondary | ICD-10-CM | POA: Diagnosis not present

## 2017-07-23 ENCOUNTER — Other Ambulatory Visit (HOSPITAL_COMMUNITY): Payer: Self-pay | Admitting: Neurosurgery

## 2017-07-23 DIAGNOSIS — I739 Peripheral vascular disease, unspecified: Secondary | ICD-10-CM

## 2017-07-26 ENCOUNTER — Ambulatory Visit (HOSPITAL_COMMUNITY)
Admission: RE | Admit: 2017-07-26 | Discharge: 2017-07-26 | Disposition: A | Discharge status: Home / Self Care | Payer: PPO | Source: Ambulatory Visit | Attending: Surgery | Admitting: Surgery

## 2017-07-26 DIAGNOSIS — I739 Peripheral vascular disease, unspecified: Secondary | ICD-10-CM | POA: Diagnosis not present

## 2017-08-05 DIAGNOSIS — I10 Essential (primary) hypertension: Secondary | ICD-10-CM | POA: Diagnosis not present

## 2017-08-05 DIAGNOSIS — M5416 Radiculopathy, lumbar region: Secondary | ICD-10-CM | POA: Diagnosis not present

## 2017-08-05 DIAGNOSIS — Z6829 Body mass index (BMI) 29.0-29.9, adult: Secondary | ICD-10-CM | POA: Diagnosis not present

## 2017-08-06 DIAGNOSIS — F17210 Nicotine dependence, cigarettes, uncomplicated: Secondary | ICD-10-CM | POA: Diagnosis not present

## 2017-08-06 DIAGNOSIS — Z79899 Other long term (current) drug therapy: Secondary | ICD-10-CM | POA: Diagnosis not present

## 2017-08-06 DIAGNOSIS — I2510 Atherosclerotic heart disease of native coronary artery without angina pectoris: Secondary | ICD-10-CM | POA: Diagnosis not present

## 2017-08-06 DIAGNOSIS — E782 Mixed hyperlipidemia: Secondary | ICD-10-CM | POA: Diagnosis not present

## 2017-08-06 DIAGNOSIS — I10 Essential (primary) hypertension: Secondary | ICD-10-CM | POA: Diagnosis not present

## 2017-08-06 DIAGNOSIS — Z0000 Encounter for general adult medical examination without abnormal findings: Secondary | ICD-10-CM | POA: Diagnosis not present

## 2017-08-06 DIAGNOSIS — Z1159 Encounter for screening for other viral diseases: Secondary | ICD-10-CM | POA: Diagnosis not present

## 2017-08-06 DIAGNOSIS — R739 Hyperglycemia, unspecified: Secondary | ICD-10-CM | POA: Diagnosis not present

## 2017-08-20 DIAGNOSIS — M5136 Other intervertebral disc degeneration, lumbar region: Secondary | ICD-10-CM | POA: Diagnosis not present

## 2017-08-20 DIAGNOSIS — M5416 Radiculopathy, lumbar region: Secondary | ICD-10-CM | POA: Diagnosis not present

## 2017-09-17 DIAGNOSIS — Z6829 Body mass index (BMI) 29.0-29.9, adult: Secondary | ICD-10-CM | POA: Diagnosis not present

## 2017-09-17 DIAGNOSIS — M5416 Radiculopathy, lumbar region: Secondary | ICD-10-CM | POA: Diagnosis not present

## 2017-09-17 DIAGNOSIS — I10 Essential (primary) hypertension: Secondary | ICD-10-CM | POA: Diagnosis not present

## 2017-09-24 ENCOUNTER — Other Ambulatory Visit: Payer: Self-pay | Admitting: Neurosurgery

## 2017-09-24 DIAGNOSIS — M5416 Radiculopathy, lumbar region: Secondary | ICD-10-CM

## 2017-10-06 ENCOUNTER — Ambulatory Visit
Admission: RE | Admit: 2017-10-06 | Discharge: 2017-10-06 | Disposition: A | Discharge status: Home / Self Care | Payer: PPO | Source: Ambulatory Visit | Attending: Neurosurgery | Admitting: Neurosurgery

## 2017-10-06 ENCOUNTER — Ambulatory Visit
Admission: RE | Admit: 2017-10-06 | Discharge: 2017-10-06 | Disposition: A | Discharge status: Home / Self Care | Payer: Medicare HMO | Source: Ambulatory Visit | Attending: Neurosurgery | Admitting: Neurosurgery

## 2017-10-06 DIAGNOSIS — M5416 Radiculopathy, lumbar region: Secondary | ICD-10-CM

## 2017-10-06 MED ORDER — ONDANSETRON HCL 4 MG/2ML IJ SOLN: 4.0000 mg | Freq: Four times a day (QID) | INTRAMUSCULAR | Status: DC | PRN

## 2017-10-06 MED ORDER — IOPAMIDOL (ISOVUE-M 200) INJECTION 41%
15.0000 mL | Freq: Once | INTRAMUSCULAR | Status: AC
  Administered 2017-10-06: 15 mL via INTRATHECAL

## 2017-10-06 MED ORDER — DIAZEPAM 5 MG PO TABS
10.0000 mg | ORAL_TABLET | Freq: Once | ORAL | Status: AC
  Administered 2017-10-06: 10 mg via ORAL

## 2017-10-06 NOTE — Discharge Instructions (Signed)

## 2017-11-05 HISTORY — PX: LUMBAR LAMINECTOMY: SHX95

## 2018-08-12 DIAGNOSIS — F17210 Nicotine dependence, cigarettes, uncomplicated: Secondary | ICD-10-CM | POA: Insufficient documentation

## 2019-03-05 ENCOUNTER — Emergency Department (HOSPITAL_COMMUNITY)
Admission: EM | Admit: 2019-03-05 | Discharge: 2019-03-05 | Disposition: A | Discharge status: Home / Self Care | Payer: Medicare HMO | Attending: Emergency Medicine | Admitting: Emergency Medicine

## 2019-03-05 ENCOUNTER — Encounter (HOSPITAL_COMMUNITY): Payer: Self-pay | Admitting: Emergency Medicine

## 2019-03-05 ENCOUNTER — Other Ambulatory Visit: Payer: Self-pay

## 2019-03-05 DIAGNOSIS — I10 Essential (primary) hypertension: Secondary | ICD-10-CM | POA: Insufficient documentation

## 2019-03-05 DIAGNOSIS — I2510 Atherosclerotic heart disease of native coronary artery without angina pectoris: Secondary | ICD-10-CM | POA: Diagnosis not present

## 2019-03-05 DIAGNOSIS — L03115 Cellulitis of right lower limb: Secondary | ICD-10-CM | POA: Insufficient documentation

## 2019-03-05 DIAGNOSIS — Z7982 Long term (current) use of aspirin: Secondary | ICD-10-CM | POA: Insufficient documentation

## 2019-03-05 DIAGNOSIS — F17210 Nicotine dependence, cigarettes, uncomplicated: Secondary | ICD-10-CM | POA: Diagnosis not present

## 2019-03-05 DIAGNOSIS — Z79899 Other long term (current) drug therapy: Secondary | ICD-10-CM | POA: Insufficient documentation

## 2019-03-05 DIAGNOSIS — M79661 Pain in right lower leg: Secondary | ICD-10-CM | POA: Diagnosis present

## 2019-03-05 DIAGNOSIS — E039 Hypothyroidism, unspecified: Secondary | ICD-10-CM | POA: Diagnosis not present

## 2019-03-05 LAB — BASIC METABOLIC PANEL
Anion gap: 5 (ref 5–15)
BUN: 14 mg/dL (ref 8–23)
CO2: 23 mmol/L (ref 22–32)
Calcium: 9.3 mg/dL (ref 8.9–10.3)
Chloride: 105 mmol/L (ref 98–111)
Creatinine, Ser: 1.09 mg/dL (ref 0.61–1.24)
GFR calc Af Amer: >60 mL/min (ref >60)
GFR calc non Af Amer: >60 mL/min (ref >60)
Glucose, Bld: 89 mg/dL (ref 70–99)
Potassium: 4.1 mmol/L (ref 3.5–5.1)
Sodium: 133 mmol/L — ABNORMAL LOW (ref 135–145)

## 2019-03-05 LAB — CBC WITH DIFFERENTIAL/PLATELET
Abs Immature Granulocytes: 0.02 10*3/uL (ref 0.00–0.07)
Basophils Absolute: 0.1 10*3/uL (ref 0.0–0.1)
Basophils Relative: 1 %
Eosinophils Absolute: 0.2 10*3/uL (ref 0.0–0.5)
Eosinophils Relative: 3 %
HCT: 43.5 % (ref 39.0–52.0)
Hemoglobin: 14.2 g/dL (ref 13.0–17.0)
Immature Granulocytes: 0 %
Lymphocytes Relative: 18 %
Lymphs Abs: 1.1 10*3/uL (ref 0.7–4.0)
MCH: 29.9 pg (ref 26.0–34.0)
MCHC: 32.6 g/dL (ref 30.0–36.0)
MCV: 91.6 fL (ref 80.0–100.0)
Monocytes Absolute: 0.6 10*3/uL (ref 0.1–1.0)
Monocytes Relative: 11 %
Neutro Abs: 4 10*3/uL (ref 1.7–7.7)
Neutrophils Relative %: 67 %
Platelets: 179 10*3/uL (ref 150–400)
RBC: 4.75 MIL/uL (ref 4.22–5.81)
RDW: 12.5 % (ref 11.5–15.5)
WBC: 5.9 10*3/uL (ref 4.0–10.5)
nRBC: 0 % (ref 0.0–0.2)

## 2019-03-05 MED ORDER — CLINDAMYCIN HCL 150 MG PO CAPS
300.0000 mg | ORAL_CAPSULE | Freq: Once | ORAL | Status: AC
  Administered 2019-03-05: 300 mg via ORAL
  Filled 2019-03-05: qty 2

## 2019-03-05 MED ORDER — CLINDAMYCIN HCL 150 MG PO CAPS: 300.0000 mg | ORAL_CAPSULE | Freq: Three times a day (TID) | ORAL | 0 refills | Status: AC
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
## 2019-03-05 NOTE — ED Notes (Signed)
Patient verbalizes understanding of discharge instructions. Opportunity for questioning and answers were provided. Armband removed by staff, pt discharged from ED ambulatory.   

## 2019-03-05 NOTE — Discharge Instructions (Addendum)
Take clindamycin as prescribed, starting tonight.  Use tylenol and ibuprofen as needed for pain.  After 48 hours, if symptoms are worsening, return to the ER for further evaluation.  If you develop high fevers, nausea/vomiting, severe worsening symptoms, return to the ER.

## 2019-03-05 NOTE — ED Triage Notes (Signed)
Pt was seen at [facility] for cellulitis on right shin and given PO antibiotics. Pt has been taking antibiotics but the cellulitis has spread.

## 2019-03-08 ENCOUNTER — Encounter (HOSPITAL_COMMUNITY): Payer: Self-pay

## 2019-03-08 ENCOUNTER — Other Ambulatory Visit: Payer: Self-pay

## 2019-03-08 ENCOUNTER — Emergency Department (HOSPITAL_COMMUNITY)
Admission: EM | Admit: 2019-03-08 | Discharge: 2019-03-08 | Disposition: A | Discharge status: Home / Self Care | Payer: Medicare HMO | Attending: Emergency Medicine | Admitting: Emergency Medicine

## 2019-03-08 DIAGNOSIS — I2510 Atherosclerotic heart disease of native coronary artery without angina pectoris: Secondary | ICD-10-CM | POA: Insufficient documentation

## 2019-03-08 DIAGNOSIS — F17210 Nicotine dependence, cigarettes, uncomplicated: Secondary | ICD-10-CM | POA: Diagnosis not present

## 2019-03-08 DIAGNOSIS — Z79899 Other long term (current) drug therapy: Secondary | ICD-10-CM | POA: Insufficient documentation

## 2019-03-08 DIAGNOSIS — L03115 Cellulitis of right lower limb: Secondary | ICD-10-CM | POA: Insufficient documentation

## 2019-03-08 DIAGNOSIS — E039 Hypothyroidism, unspecified: Secondary | ICD-10-CM | POA: Insufficient documentation

## 2019-03-08 DIAGNOSIS — L539 Erythematous condition, unspecified: Secondary | ICD-10-CM | POA: Diagnosis present

## 2019-03-08 DIAGNOSIS — I10 Essential (primary) hypertension: Secondary | ICD-10-CM | POA: Insufficient documentation

## 2019-03-08 DIAGNOSIS — Z7982 Long term (current) use of aspirin: Secondary | ICD-10-CM | POA: Insufficient documentation

## 2019-03-08 LAB — CBC WITH DIFFERENTIAL/PLATELET
Abs Immature Granulocytes: 0.01 10*3/uL (ref 0.00–0.07)
Basophils Absolute: 0.1 10*3/uL (ref 0.0–0.1)
Basophils Relative: 1 %
Eosinophils Absolute: 0.1 10*3/uL (ref 0.0–0.5)
Eosinophils Relative: 2 %
HCT: 47.4 % (ref 39.0–52.0)
Hemoglobin: 15.5 g/dL (ref 13.0–17.0)
Immature Granulocytes: 0 %
Lymphocytes Relative: 26 %
Lymphs Abs: 1.7 10*3/uL (ref 0.7–4.0)
MCH: 29.9 pg (ref 26.0–34.0)
MCHC: 32.7 g/dL (ref 30.0–36.0)
MCV: 91.5 fL (ref 80.0–100.0)
Monocytes Absolute: 0.6 10*3/uL (ref 0.1–1.0)
Monocytes Relative: 9 %
Neutro Abs: 3.9 10*3/uL (ref 1.7–7.7)
Neutrophils Relative %: 62 %
Platelets: 208 10*3/uL (ref 150–400)
RBC: 5.18 MIL/uL (ref 4.22–5.81)
RDW: 12.7 % (ref 11.5–15.5)
WBC: 6.3 10*3/uL (ref 4.0–10.5)
nRBC: 0 % (ref 0.0–0.2)

## 2019-03-08 LAB — COMPREHENSIVE METABOLIC PANEL
ALT: 24 U/L (ref 0–44)
AST: 23 U/L (ref 15–41)
Albumin: 4.2 g/dL (ref 3.5–5.0)
Alkaline Phosphatase: 35 U/L — ABNORMAL LOW (ref 38–126)
Anion gap: 10 (ref 5–15)
BUN: 15 mg/dL (ref 8–23)
CO2: 20 mmol/L — ABNORMAL LOW (ref 22–32)
Calcium: 9.7 mg/dL (ref 8.9–10.3)
Chloride: 105 mmol/L (ref 98–111)
Creatinine, Ser: 1.21 mg/dL (ref 0.61–1.24)
GFR calc Af Amer: >60 mL/min (ref >60)
GFR calc non Af Amer: >60 mL/min (ref >60)
Glucose, Bld: 98 mg/dL (ref 70–99)
Potassium: 4.2 mmol/L (ref 3.5–5.1)
Sodium: 135 mmol/L (ref 135–145)
Total Bilirubin: 0.7 mg/dL (ref 0.3–1.2)
Total Protein: 7 g/dL (ref 6.5–8.1)

## 2019-03-08 MED ORDER — VANCOMYCIN HCL IN DEXTROSE 1-5 GM/200ML-% IV SOLN
1000.0000 mg | Freq: Once | INTRAVENOUS | Status: AC
  Administered 2019-03-08: 1000 mg via INTRAVENOUS
  Filled 2019-03-08: qty 200
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
## 2019-03-08 NOTE — Discharge Instructions (Addendum)
Please continue taking your clindamycin. Your laboratory results are within normal limits today. Please return to the ED or schedule an appointment with your primary care physician for a wound recheck within 2 days. If you experience any fever, weakness of your leg, or spreading of the wound please return to the ED sooner.

## 2019-03-08 NOTE — ED Notes (Signed)
Discharge instructions discussed with Pt. Pt verbalized understanding. Pt stable and ambulatory.    
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
## 2019-09-16 ENCOUNTER — Other Ambulatory Visit: Payer: Self-pay

## 2019-09-16 ENCOUNTER — Observation Stay (HOSPITAL_COMMUNITY)
Admission: EM | Admit: 2019-09-16 | Discharge: 2019-09-17 | Disposition: A | Discharge status: Home / Self Care | Payer: Medicare HMO | Attending: Family Medicine | Admitting: Family Medicine

## 2019-09-16 ENCOUNTER — Emergency Department (HOSPITAL_COMMUNITY): Payer: Medicare HMO

## 2019-09-16 ENCOUNTER — Encounter (HOSPITAL_COMMUNITY): Payer: Self-pay

## 2019-09-16 DIAGNOSIS — R791 Abnormal coagulation profile: Secondary | ICD-10-CM | POA: Diagnosis not present

## 2019-09-16 DIAGNOSIS — E039 Hypothyroidism, unspecified: Secondary | ICD-10-CM | POA: Insufficient documentation

## 2019-09-16 DIAGNOSIS — Z79899 Other long term (current) drug therapy: Secondary | ICD-10-CM | POA: Insufficient documentation

## 2019-09-16 DIAGNOSIS — I10 Essential (primary) hypertension: Secondary | ICD-10-CM | POA: Diagnosis not present

## 2019-09-16 DIAGNOSIS — Z79890 Hormone replacement therapy (postmenopausal): Secondary | ICD-10-CM | POA: Diagnosis not present

## 2019-09-16 DIAGNOSIS — Y907 Blood alcohol level of 200-239 mg/100 ml: Secondary | ICD-10-CM | POA: Insufficient documentation

## 2019-09-16 DIAGNOSIS — F10920 Alcohol use, unspecified with intoxication, uncomplicated: Secondary | ICD-10-CM

## 2019-09-16 DIAGNOSIS — R531 Weakness: Principal | ICD-10-CM | POA: Insufficient documentation

## 2019-09-16 DIAGNOSIS — F17210 Nicotine dependence, cigarettes, uncomplicated: Secondary | ICD-10-CM | POA: Diagnosis not present

## 2019-09-16 DIAGNOSIS — I639 Cerebral infarction, unspecified: Secondary | ICD-10-CM

## 2019-09-16 DIAGNOSIS — F10929 Alcohol use, unspecified with intoxication, unspecified: Secondary | ICD-10-CM | POA: Diagnosis present

## 2019-09-16 DIAGNOSIS — Z7982 Long term (current) use of aspirin: Secondary | ICD-10-CM | POA: Diagnosis not present

## 2019-09-16 DIAGNOSIS — Z8673 Personal history of transient ischemic attack (TIA), and cerebral infarction without residual deficits: Secondary | ICD-10-CM | POA: Insufficient documentation

## 2019-09-16 DIAGNOSIS — Z20828 Contact with and (suspected) exposure to other viral communicable diseases: Secondary | ICD-10-CM | POA: Diagnosis not present

## 2019-09-16 DIAGNOSIS — M6281 Muscle weakness (generalized): Secondary | ICD-10-CM | POA: Insufficient documentation

## 2019-09-16 DIAGNOSIS — I25118 Atherosclerotic heart disease of native coronary artery with other forms of angina pectoris: Secondary | ICD-10-CM | POA: Insufficient documentation

## 2019-09-16 DIAGNOSIS — R2990 Unspecified symptoms and signs involving the nervous system: Secondary | ICD-10-CM

## 2019-09-16 LAB — I-STAT CHEM 8, ED
BUN: 11 mg/dL (ref 8–23)
Calcium, Ion: 1.14 mmol/L — ABNORMAL LOW (ref 1.15–1.40)
Chloride: 104 mmol/L (ref 98–111)
Creatinine, Ser: 1.2 mg/dL (ref 0.61–1.24)
Glucose, Bld: 97 mg/dL (ref 70–99)
HCT: 48 % (ref 39.0–52.0)
Hemoglobin: 16.3 g/dL (ref 13.0–17.0)
Potassium: 3.2 mmol/L — ABNORMAL LOW (ref 3.5–5.1)
Sodium: 140 mmol/L (ref 135–145)
TCO2: 23 mmol/L (ref 22–32)

## 2019-09-16 LAB — URINALYSIS, ROUTINE W REFLEX MICROSCOPIC
Bilirubin Urine: NEGATIVE
Glucose, UA: NEGATIVE mg/dL
Hgb urine dipstick: NEGATIVE
Ketones, ur: NEGATIVE mg/dL
Leukocytes,Ua: NEGATIVE
Nitrite: NEGATIVE
Protein, ur: NEGATIVE mg/dL
Specific Gravity, Urine: 1.003 — ABNORMAL LOW (ref 1.005–1.030)
pH: 6 (ref 5.0–8.0)

## 2019-09-16 LAB — CBC
HCT: 46.2 % (ref 39.0–52.0)
Hemoglobin: 15.8 g/dL (ref 13.0–17.0)
MCH: 30.7 pg (ref 26.0–34.0)
MCHC: 34.2 g/dL (ref 30.0–36.0)
MCV: 89.7 fL (ref 80.0–100.0)
Platelets: 198 10*3/uL (ref 150–400)
RBC: 5.15 MIL/uL (ref 4.22–5.81)
RDW: 13.1 % (ref 11.5–15.5)
WBC: 6.5 10*3/uL (ref 4.0–10.5)
nRBC: 0 % (ref 0.0–0.2)

## 2019-09-16 LAB — COMPREHENSIVE METABOLIC PANEL
ALT: 33 U/L (ref 0–44)
AST: 37 U/L (ref 15–41)
Albumin: 4.4 g/dL (ref 3.5–5.0)
Alkaline Phosphatase: 32 U/L — ABNORMAL LOW (ref 38–126)
Anion gap: 10 (ref 5–15)
BUN: 11 mg/dL (ref 8–23)
CO2: 25 mmol/L (ref 22–32)
Calcium: 9.3 mg/dL (ref 8.9–10.3)
Chloride: 104 mmol/L (ref 98–111)
Creatinine, Ser: 1.09 mg/dL (ref 0.61–1.24)
GFR calc Af Amer: >60 mL/min (ref >60)
GFR calc non Af Amer: >60 mL/min (ref >60)
Glucose, Bld: 101 mg/dL — ABNORMAL HIGH (ref 70–99)
Potassium: 3.9 mmol/L (ref 3.5–5.1)
Sodium: 139 mmol/L (ref 135–145)
Total Bilirubin: 1 mg/dL (ref 0.3–1.2)
Total Protein: 6.9 g/dL (ref 6.5–8.1)

## 2019-09-16 LAB — DIFFERENTIAL
Abs Immature Granulocytes: 0.03 K/uL (ref 0.00–0.07)
Basophils Absolute: 0.1 K/uL (ref 0.0–0.1)
Basophils Relative: 2 %
Eosinophils Absolute: 0.2 K/uL (ref 0.0–0.5)
Eosinophils Relative: 3 %
Immature Granulocytes: 1 %
Lymphocytes Relative: 29 %
Lymphs Abs: 1.9 K/uL (ref 0.7–4.0)
Monocytes Absolute: 0.4 K/uL (ref 0.1–1.0)
Monocytes Relative: 6 %
Neutro Abs: 3.9 K/uL (ref 1.7–7.7)
Neutrophils Relative %: 59 %

## 2019-09-16 LAB — RAPID URINE DRUG SCREEN, HOSP PERFORMED
Amphetamines: NOT DETECTED
Barbiturates: NOT DETECTED
Benzodiazepines: POSITIVE — AB
Cocaine: NOT DETECTED
Opiates: NOT DETECTED
Tetrahydrocannabinol: NOT DETECTED

## 2019-09-16 LAB — CBG MONITORING, ED: Glucose-Capillary: 93 mg/dL (ref 70–99)

## 2019-09-16 LAB — PROTIME-INR
INR: 1 (ref 0.8–1.2)
Prothrombin Time: 13.1 s (ref 11.4–15.2)

## 2019-09-16 LAB — ETHANOL: Alcohol, Ethyl (B): 216 mg/dL — ABNORMAL HIGH (ref <10)

## 2019-09-16 LAB — APTT: aPTT: 42 seconds — ABNORMAL HIGH (ref 24–36)

## 2019-09-16 MED ORDER — LORAZEPAM 1 MG PO TABS: 1.0000 mg | ORAL_TABLET | ORAL | Status: DC | PRN

## 2019-09-16 MED ORDER — ACETAMINOPHEN 325 MG PO TABS: 650.0000 mg | ORAL_TABLET | ORAL | Status: DC | PRN

## 2019-09-16 MED ORDER — IOHEXOL 350 MG/ML SOLN
100.0000 mL | Freq: Once | INTRAVENOUS | Status: AC | PRN
  Administered 2019-09-16: 21:00:00 100 mL via INTRAVENOUS

## 2019-09-16 MED ORDER — PANTOPRAZOLE SODIUM 40 MG PO TBEC
80.0000 mg | DELAYED_RELEASE_TABLET | Freq: Every day | ORAL | Status: DC
  Administered 2019-09-17: 80 mg via ORAL
  Filled 2019-09-16: qty 2

## 2019-09-16 MED ORDER — LEVOTHYROXINE SODIUM 112 MCG PO TABS
112.0000 ug | ORAL_TABLET | Freq: Every day | ORAL | Status: DC
  Administered 2019-09-17: 06:00:00 112 ug via ORAL
  Filled 2019-09-16: qty 1

## 2019-09-16 MED ORDER — FOLIC ACID 1 MG PO TABS
1.0000 mg | ORAL_TABLET | Freq: Every day | ORAL | Status: DC
  Administered 2019-09-17: 1 mg via ORAL
  Filled 2019-09-16: qty 1

## 2019-09-16 MED ORDER — ASPIRIN EC 81 MG PO TBEC
81.0000 mg | DELAYED_RELEASE_TABLET | Freq: Every day | ORAL | Status: DC
  Administered 2019-09-17: 01:00:00 81 mg via ORAL
  Filled 2019-09-16: qty 1

## 2019-09-16 MED ORDER — LORAZEPAM 2 MG/ML IJ SOLN: 1.0000 mg | INTRAMUSCULAR | Status: DC | PRN

## 2019-09-16 MED ORDER — ROSUVASTATIN CALCIUM 20 MG PO TABS
40.0000 mg | ORAL_TABLET | Freq: Every morning | ORAL | Status: DC
  Administered 2019-09-17: 40 mg via ORAL
  Filled 2019-09-16: qty 2

## 2019-09-16 MED ORDER — ADULT MULTIVITAMIN W/MINERALS CH
1.0000 | ORAL_TABLET | Freq: Every day | ORAL | Status: DC
  Administered 2019-09-17: 10:00:00 1 via ORAL
  Filled 2019-09-16: qty 1

## 2019-09-16 MED ORDER — ACETAMINOPHEN 650 MG RE SUPP: 650.0000 mg | RECTAL | Status: DC | PRN

## 2019-09-16 MED ORDER — SENNOSIDES-DOCUSATE SODIUM 8.6-50 MG PO TABS: 1.0000 | ORAL_TABLET | Freq: Every evening | ORAL | Status: DC | PRN

## 2019-09-16 MED ORDER — ACETAMINOPHEN 160 MG/5ML PO SOLN: 650.0000 mg | ORAL | Status: DC | PRN

## 2019-09-16 MED ORDER — ENOXAPARIN SODIUM 40 MG/0.4ML ~~LOC~~ SOLN
40.0000 mg | Freq: Every day | SUBCUTANEOUS | Status: DC
  Administered 2019-09-17: 01:00:00 40 mg via SUBCUTANEOUS
  Filled 2019-09-16: qty 0.4

## 2019-09-16 MED ORDER — STROKE: EARLY STAGES OF RECOVERY BOOK
Freq: Once | Status: AC
  Filled 2019-09-16: qty 1

## 2019-09-16 MED ORDER — THIAMINE HCL 100 MG/ML IJ SOLN: 100.0000 mg | Freq: Every day | INTRAMUSCULAR | Status: DC

## 2019-09-16 MED ORDER — THIAMINE HCL 100 MG PO TABS
100.0000 mg | ORAL_TABLET | Freq: Every day | ORAL | Status: DC
  Administered 2019-09-17: 10:00:00 100 mg via ORAL
  Filled 2019-09-16: qty 1

## 2019-09-16 NOTE — H&P (Signed)
History and Physical    Carl [name] [id number] DOB: 02-Nov-1955 DOA: 09/16/2019  PCP: Vicenta Aly, [place]  Patient coming from: Home  I have personally briefly reviewed patient's old medical records in [place]  Chief Complaint: R sided weakness  HPI: Carl [name] is a 63 y.o. male with medical history significant of prior stroke with residual L sided mild weakness, HTN, HLD.  Patient [place] ED from [place] where he was drinking and playing billiards this evening.  Had sudden onset R sided weakness, fell.  Got up then fell again down 3 stairs.  EMS called.  Pt drank 6 beers prior to falling.  Came in to ED as code stroke.  Patient with intermittent weakness of R side for several weeks, 4 episodes, all with complete recover of strength.  ED Course: EtOH level 210.  CT head and neck neg.  CTA head and neck neg for emergent occlusion.   Review of Systems: As per HPI, otherwise all review of systems negative.  Past Medical History:  Diagnosis Date  . Arthritis   . CAD (coronary artery disease)   . Hyperlipemia   . Hypertension   . Hypothyroid   . 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 Stroke ([place]) APRIL 06/2012  . Tobacco abuse     Past Surgical History:  Procedure Laterality Date  . CARDIAC CATHETERIZATION  06-13-2007 AND 10-08-2009  . HERNIA REPAIR  12/28/12   RIH repair  . INGUINAL HERNIA REPAIR Right 12/28/2012   Procedure: HERNIA REPAIR INGUINAL ADULT right with mesh ;  Surgeon: Madilyn Hook, DO;  Location: [facility];  Service: General;  Laterality: Right;  . INSERTION OF MESH Right 12/28/2012   Procedure: INSERTION OF MESH;  Surgeon: Madilyn Hook, DO;  Location: [facility];  Service: General;  Laterality: Right;     reports that he has been smoking cigarettes. He has a 30.00 pack-year smoking history. He has never used smokeless tobacco. He reports current alcohol use. He reports that he does not use drugs.  No Known Allergies  Family History  Problem Relation Age of  Onset  . Heart disease Mother   . Heart disease Father      Prior to Admission medications   Medication Sig Start Date End Date Taking? Authorizing Provider  ALPRAZolam Duanne Moron) 0.5 MG tablet Take 0.25-0.5 mg by mouth 2 (two) times daily as needed for anxiety or sleep. 04/12/18   [provider]  aspirin EC 81 MG tablet Take 81 mg by mouth at bedtime.     [provider]  cyclobenzaprine (FLEXERIL) 10 MG tablet Take 10 mg by mouth 3 (three) times daily as needed. For spasms    [provider]  fenofibrate (TRICOR) 145 MG tablet Take 145 mg by mouth every morning.    [provider]  ibuprofen (ADVIL) 200 MG tablet Take 400 mg by mouth every 6 (six) hours as needed for headache or moderate pain.    [provider]  levothyroxine (SYNTHROID, LEVOTHROID) 112 MCG tablet Take 112 mcg by mouth daily.     [provider]  lisinopril (PRINIVIL,ZESTRIL) 5 MG tablet Take 5 mg by mouth every morning.     [provider]  nitroGLYCERIN (NITRODUR - DOSED IN MG/24 HR) 0.3 mg/hr patch Place 1 patch (0.3 mg total) onto the skin as needed. Patient not taking: Reported on 09/24/2017 06/01/14   Jettie Booze, MD  nitroGLYCERIN (NITROSTAT) 0.4 MG SL tablet Place 1 tablet (0.4 mg total)  under the tongue every 5 (five) minutes as needed for chest pain. 06/01/14   Jettie Booze, MD  omeprazole (PRILOSEC) 40 MG capsule Take 40 mg by mouth daily.     [provider]  rosuvastatin (CRESTOR) 40 MG tablet Take 40 mg by mouth every morning.     [provider]    Physical Exam: Vitals:   09/16/19 2032 09/16/19 2122 09/16/19 2132  Pulse: (!) 55    SpO2:  98%   Weight:   91.1 kg  Height:   5\' 10"  (1.778 m)    Constitutional: NAD, calm, comfortable Eyes: PERRL, lids and conjunctivae normal ENMT: Mucous membranes are moist. Posterior pharynx clear of any exudate or lesions.Normal dentition.  Neck: normal, supple, no masses, no  thyromegaly Respiratory: clear to auscultation bilaterally, no wheezing, no crackles. Normal respiratory effort. No accessory muscle use.  Cardiovascular: Regular rate and rhythm, no murmurs / rubs / gallops. No extremity edema. 2+ pedal pulses. No carotid bruits.  Abdomen: no tenderness, no masses palpated. No hepatosplenomegaly. Bowel sounds positive.  Musculoskeletal: no clubbing / cyanosis. No joint deformity upper and lower extremities. Good ROM, no contractures. Normal muscle tone.  Skin: no rashes, lesions, ulcers. No induration Neurologic: CN 2-12 grossly intact. Sensation intact, DTR normal. Strength 4+/5 in all 4. Psychiatric: Normal judgment and insight. Alert and oriented x 3. Normal mood.    Labs on Admission: I have personally reviewed following labs and imaging studies  CBC: Recent Labs  Lab 09/16/19 2018 09/16/19 2025  WBC 6.5  --   NEUTROABS 3.9  --   HGB 15.8 16.3  HCT 46.2 48.0  MCV 89.7  --   PLT 198  --    Basic Metabolic Panel: Recent Labs  Lab 09/16/19 2018 09/16/19 2025  NA 139 140  K 3.9 3.2*  CL 104 104  CO2 25  --   GLUCOSE 101* 97  BUN 11 11  CREATININE 1.09 1.20  CALCIUM 9.3  --    GFR: Estimated Creatinine Clearance: 71.5 mL/min (by C-G formula based on SCr of 1.2 mg/dL). Liver Function Tests: Recent Labs  Lab 09/16/19 2018  AST 37  ALT 33  ALKPHOS 32*  BILITOT 1.0  PROT 6.9  ALBUMIN 4.4   No results for input(s): LIPASE, AMYLASE in the last 168 hours. No results for input(s): AMMONIA in the last 168 hours. Coagulation Profile: Recent Labs  Lab 09/16/19 2018  INR 1.0   Cardiac Enzymes: No results for input(s): CKTOTAL, CKMB, CKMBINDEX, TROPONINI in the last 168 hours. BNP (last 3 results) No results for input(s): PROBNP in the last 8760 hours. HbA1C: No results for input(s): HGBA1C in the last 72 hours. CBG: Recent Labs  Lab 09/16/19 2007  GLUCAP 93   Lipid Profile: No results for input(s): CHOL, HDL, LDLCALC, TRIG,  CHOLHDL, LDLDIRECT in the last 72 hours. Thyroid Function Tests: No results for input(s): TSH, T4TOTAL, FREET4, T3FREE, THYROIDAB in the last 72 hours. Anemia Panel: No results for input(s): VITAMINB12, FOLATE, FERRITIN, TIBC, IRON, RETICCTPCT in the last 72 hours. Urine analysis:    Component Value Date/Time   COLORURINE STRAW (A) 09/16/2019 2017   APPEARANCEUR CLEAR 09/16/2019 2017   LABSPEC 1.003 (L) 09/16/2019 2017   PHURINE 6.0 09/16/2019 2017   GLUCOSEU NEGATIVE 09/16/2019 2017   HGBUR NEGATIVE 09/16/2019 2017   BILIRUBINUR NEGATIVE 09/16/2019 2017   KETONESUR NEGATIVE 09/16/2019 2017   PROTEINUR NEGATIVE 09/16/2019 2017   UROBILINOGEN 0.2 06/12/2007 1531   NITRITE NEGATIVE  09/16/2019 2017   LEUKOCYTESUR NEGATIVE 09/16/2019 2017    Radiological Exams on Admission: CT Code Stroke CTA Head W/WO contrast  Result Date: 09/16/2019 CLINICAL DATA:  Code stroke. Acute onset of left-sided weakness and ataxia. EXAM: CT ANGIOGRAPHY HEAD AND NECK TECHNIQUE: Multidetector CT imaging of the head and neck was performed using the standard protocol during bolus administration of intravenous contrast. Multiplanar CT image reconstructions and MIPs were obtained to evaluate the vascular anatomy. Carotid stenosis measurements (when applicable) are obtained utilizing NASCET criteria, using the distal internal carotid diameter as the denominator. CONTRAST:  17mL OMNIPAQUE IOHEXOL 350 MG/ML SOLN COMPARISON:  CT head without contrast 09/16/2019 FINDINGS: CTA NECK FINDINGS Aortic arch: A 3 vessel arch configuration is present. Atherosclerotic calcifications are present at the origin of the left subclavian artery. Calcifications are present in the distal arch. There is no significant stenosis of the great vessel origins. No aneurysm is present. Right carotid system: The right common carotid artery is within normal limits. Atherosclerotic calcifications are present at the bifurcation. The lumen is narrowed to  2.5 mm. There is no significant stenosis of greater than 50% relative to the more distal vessel. Left carotid system: The left common carotid artery is within normal limits. Atherosclerotic calcifications are present at the proximal left ICA without a significant stenosis relative to the more distal vessel. There is mild tortuosity in the distal cervical left ICA. Mural calcification is present just below the skull base without a significant stenosis. Vertebral arteries: The vertebral arteries are codominant. 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 Calcification is present at the origin of the right subclavian artery without a significant stenosis. No other significant stenosis is present in either vertebral artery in the neck. Skeleton: Degenerative changes the cervical spine are most prominent at C5-6 and C6-7 with osseous foraminal narrowing bilaterally, left greater than right. No focal lytic or blastic lesions are present. Advanced dental disease is noted. Other neck: The soft tissues of the neck otherwise within limits. Upper chest: Dependent atelectasis present in visualized lung fields. No focal nodule, mass, or airspace disease is present. Review of the MIP images confirms the above findings CTA HEAD FINDINGS Anterior circulation: Atherosclerotic calcifications are present within the cavernous internal carotid arteries bilaterally. There is no significant stenosis relative to the ICA terminus. The A1 and M1 segments are normal. No significant anterior communicating artery is visualized. MCA bifurcations are intact. The ACA and MCA branch vessels are within normal limits. Posterior circulation: Vertebral arteries are within normal limits. PICA origins are visualized and normal. Both posterior cerebral arteries originate from the basilar tip. The PCA branch vessels are within normal limits. Venous sinuses: The dural sinuses are patent. Straight sinus deep cerebral veins are  intact. Cortical veins are within normal limits. No vascular malformations are present. Anatomic variants: None Review of the MIP images confirms the above findings IMPRESSION: 1. No emergent large vessel occlusion within the Circle of Willis. No right MCA thrombus. 2. Atherosclerotic changes of the cavernous internal carotid arteries bilaterally without significant stenosis. 3. 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 4. Atherosclerotic changes at the carotid bifurcations bilaterally without a significant stenosis relative to the more distal vessels. 5. Multilevel spondylosis of the cervical spine is most pronounced at C5-6 and C6-7 with osseous foraminal narrowing bilaterally, left greater than right. 6. No significant proximal stenosis, aneurysm, or branch vessel occlusion within the Circle of Willis. 7. Aortic Atherosclerosis (ICD10-I70.0). The above  was relayed via text pager to Dr. Kerney Elbe on 09/16/2019 at 20:52 . Electronically Signed   By: San Morelle M.D.   On: 09/16/2019 21:00   CT Code Stroke CTA Neck W/WO contrast  Result Date: 09/16/2019 CLINICAL DATA:  Code stroke. Acute onset of left-sided weakness and ataxia. EXAM: CT ANGIOGRAPHY HEAD AND NECK TECHNIQUE: Multidetector CT imaging of the head and neck was performed using the standard protocol during bolus administration of intravenous contrast. Multiplanar CT image reconstructions and MIPs were obtained to evaluate the vascular anatomy. Carotid stenosis measurements (when applicable) are obtained utilizing NASCET criteria, using the distal internal carotid diameter as the denominator. CONTRAST:  159mL OMNIPAQUE IOHEXOL 350 MG/ML SOLN COMPARISON:  CT head without contrast 09/16/2019 FINDINGS: CTA NECK FINDINGS Aortic arch: A 3 vessel arch configuration is present. Atherosclerotic calcifications are present at the origin of the left subclavian artery. Calcifications are present in the distal arch. There is no  significant stenosis of the great vessel origins. No aneurysm is present. Right carotid system: The right common carotid artery is within normal limits. Atherosclerotic calcifications are present at the bifurcation. The lumen is narrowed to 2.5 mm. There is no significant stenosis of greater than 50% relative to the more distal vessel. Left carotid system: The left common carotid artery is within normal limits. Atherosclerotic calcifications are present at the proximal left ICA without a significant stenosis relative to the more distal vessel. There is mild tortuosity in the distal cervical left ICA. Mural calcification is present just below the skull base without a significant stenosis. Vertebral arteries: The vertebral arteries are codominant. 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 Calcification is present at the origin of the right subclavian artery without a significant stenosis. No other significant stenosis is present in either vertebral artery in the neck. Skeleton: Degenerative changes the cervical spine are most prominent at C5-6 and C6-7 with osseous foraminal narrowing bilaterally, left greater than right. No focal lytic or blastic lesions are present. Advanced dental disease is noted. Other neck: The soft tissues of the neck otherwise within limits. Upper chest: Dependent atelectasis present in visualized lung fields. No focal nodule, mass, or airspace disease is present. Review of the MIP images confirms the above findings CTA HEAD FINDINGS Anterior circulation: Atherosclerotic calcifications are present within the cavernous internal carotid arteries bilaterally. There is no significant stenosis relative to the ICA terminus. The A1 and M1 segments are normal. No significant anterior communicating artery is visualized. MCA bifurcations are intact. The ACA and MCA branch vessels are within normal limits. Posterior circulation: Vertebral arteries are within normal  limits. PICA origins are visualized and normal. Both posterior cerebral arteries originate from the basilar tip. The PCA branch vessels are within normal limits. Venous sinuses: The dural sinuses are patent. Straight sinus deep cerebral veins are intact. Cortical veins are within normal limits. No vascular malformations are present. Anatomic variants: None Review of the MIP images confirms the above findings IMPRESSION: 1. No emergent large vessel occlusion within the Circle of Willis. No right MCA thrombus. 2. Atherosclerotic changes of the cavernous internal carotid arteries bilaterally without significant stenosis. 3. 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 4. Atherosclerotic changes at the carotid bifurcations bilaterally without a significant stenosis relative to the more distal vessels. 5. Multilevel spondylosis of the cervical spine is most pronounced at C5-6 and C6-7 with osseous foraminal narrowing bilaterally, left greater than right. 6. No significant proximal stenosis, aneurysm,  or branch vessel occlusion within the Circle of Willis. 7. Aortic Atherosclerosis (ICD10-I70.0). The above was relayed via text pager to Dr. Kerney Elbe on 09/16/2019 at 20:52 . Electronically Signed   By: San Morelle M.D.   On: 09/16/2019 21:00   CT HEAD CODE STROKE WO CONTRAST`  Result Date: 09/16/2019 CLINICAL DATA:  Code stroke. Ataxia. Left-sided weakness. Stroke suspected. Last seen normal 2.5 hours ago. EXAM: CT HEAD WITHOUT CONTRAST TECHNIQUE: Contiguous axial images were obtained from the base of the skull through the vertex without intravenous contrast. COMPARISON:  CT head without contrast 12/30/2011. MR head without contrast 12/31/2011. FINDINGS: Brain: Remote lacunar infarct is noted along the bilateral ventricle. No acute infarct, hemorrhage, or mass lesion is present. Basal ganglia are intact. Insular ribbon is normal bilaterally. No acute or focal cortical abnormalities are  present. The ventricles are of normal size. No significant extraaxial fluid collection is present. The brainstem and cerebellum are within normal limits. Vascular: Atherosclerotic changes are present within the cavernous internal carotid arteries bilaterally. Hyperdense right M1 segment is concerning for acute thrombus. Skull: Calvarium is intact. No focal lytic or blastic lesions are present. No significant extracranial soft tissue lesion is present. Sinuses/Orbits: There is scattered opacification of ethmoid air cells. Paranasal sinuses and mastoid air cells are otherwise clear. ASPECTS [facility] Stroke Program Early CT Score) - Ganglionic level infarction (caudate, lentiform nuclei, internal capsule, insula, M1-M3 cortex): 7/7 - Supraganglionic infarction (M4-M6 cortex): 3/3 Total score (0-10 with 10 being normal): 10/10 IMPRESSION: 1. Remote white matter infarct adjacent to the right lateral ventricle. 2. No acute cortical abnormality. 3. Question hyperdense right M1 segment suggesting thrombus. Recommend CTA of the head and neck for further evaluation. 4. ASPECTS is 10/10 The above was relayed via text pager to Dr. Cheral Marker on 09/16/2019 at 20:29 . Electronically Signed   By: San Morelle M.D.   On: 09/16/2019 20:29    EKG: Independently reviewed.  Assessment/Plan Principal Problem:   Acute right-sided weakness Active Problems:   Hypertension   History of CVA (cerebrovascular accident)   Alcohol intoxication ([place])    1. Acute R sided weakness - 1. Admit for stroke work up 2. MRI brain 3. PT/OT/SLP 4. Cont ASA and statin 5. 2d echo 6. A1C and FLP 7. Neuro consult note in chart: 1. EtOH level 216 2. UDS positive for benzos, presumably the xanax he takes chronically.  Probably shouldn't be mixing that with alcohol though. 2. HTN - 1. Permissive HTN for 24h, holding lisinopril 3. EtOH intoxication - 1. CIWA 2. Also has benzos on board per UDS, likely the xanax he is  prescribed  DVT prophylaxis: Lovenox Code Status: Full Family Communication: No family in room Disposition Plan: Home after admit Consults called: Neuro Admission status: Place in 26   Carianne Taira, [place] [facility]  How to contact the [facility] Attending or Consulting provider [place] or covering provider during after hours [place], for this patient?  1. Check the care team in [facility] and look for a) attending/consulting [facility] provider listed and b) the [facility] team listed 2. Log into [email or web address]  Amion Physician Scheduling and messaging for groups and whole hospitals  On call and physician scheduling software for group practices, residents, hospitalists and other medical providers for call, clinic, rotation and shift schedules. On[employer] is a hospital-wide system for scheduling doctors and paging doctors on call. EasyPlot is for scientific plotting and data analysis.  [email or web address]  and use [place]'s universal password  to access. If you do not have the password, please contact the hospital operator.  3. Locate the [facility] provider you are looking for under [facility] and page to a number that you can be directly reached. 4. If you still have difficulty reaching the provider, please page the [facility] (Director on Call) for the Hospitalists listed on amion for assistance.  09/16/2019, 11:16 PM
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
## 2019-09-16 NOTE — ED Notes (Signed)
Patient wife calling asking for an update Carl [name] [phone]

## 2019-09-16 NOTE — Consult Note (Signed)
Referring Physician: Dr. Ronnald Nian    Chief Complaint: Acute onset of right sided weakness.   HPI: Carl [name] is an 63 y.o. male presenting acutely from an establishment where he was drinking and playing pool with his son. The patient suddenly had right sided weakness and fell. He then got up and fell again, down 3 stairs. EMS was called. He states that he drank 6 beers prior to falling. CBG was WNL per EMS. BP on arrival was 120/80. He has had intermittent weakness on the right for several weeks, a total of 4 times, all with complete recovery of strength. He has a prior history of stroke with residual mild left sided weakness. He denies taking a blood thinner. He smokes 1.5 ppd.   Stroke risk factors include prior stroke, CAD, HLD, HTN and tobacco use.  Home meds include ASA and rosuvastatin.   LSN: [id number] tPA Given: No: NIHSS 2 with minimal giveway weakness on both sides - unable to localize  Past Medical History:  Diagnosis Date  . Arthritis   . CAD (coronary artery disease)   . Hyperlipemia   . Hypertension   . Hypothyroid   . 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 Stroke ([place]) APRIL 06/2012  . Tobacco abuse     Past Surgical History:  Procedure Laterality Date  . CARDIAC CATHETERIZATION  06-13-2007 AND 10-08-2009  . HERNIA REPAIR  12/28/12   RIH repair  . INGUINAL HERNIA REPAIR Right 12/28/2012   Procedure: HERNIA REPAIR INGUINAL ADULT right with mesh ;  Surgeon: Madilyn Hook, DO;  Location: [facility];  Service: General;  Laterality: Right;  . INSERTION OF MESH Right 12/28/2012   Procedure: INSERTION OF MESH;  Surgeon: Madilyn Hook, DO;  Location: [facility];  Service: General;  Laterality: Right;    Family History  Problem Relation Age of Onset  . Heart disease Mother   . Heart disease Father    Social History:  reports that he has been smoking cigarettes. He has a 30.00 pack-year smoking history. He has never used smokeless tobacco. He reports current alcohol use. He reports that he does not  use drugs.  Allergies: No Known Allergies  Medications:  No current facility-administered medications on file prior to encounter.   Current Outpatient Medications on File Prior to Encounter  Medication Sig Dispense Refill  . ALPRAZolam (XANAX) 0.5 MG tablet Take 0.25-0.5 mg by mouth 2 (two) times daily as needed for anxiety or sleep.    Marland Kitchen aspirin EC 81 MG tablet Take 81 mg by mouth at bedtime.     . cyclobenzaprine (FLEXERIL) 10 MG tablet Take 10 mg by mouth 3 (three) times daily as needed. For spasms    . DULoxetine (CYMBALTA) 60 MG capsule Take 1 capsule (60 mg total) by mouth daily. (Patient not taking: Reported on 09/24/2017) 30 capsule 2  . fenofibrate (TRICOR) 145 MG tablet Take 145 mg by mouth every morning.    . gabapentin (NEURONTIN) 300 MG capsule Take 1 capsule (300 mg total) by mouth at bedtime. (Patient not taking: Reported on 03/08/2019) 30 capsule 2  . HYDROcodone-acetaminophen (NORCO) 10-325 MG tablet Take 1 tablet by mouth 3 (three) times daily as needed for severe pain. (Patient not taking: Reported on 03/08/2019) 15 tablet 0  . ibuprofen (ADVIL) 200 MG tablet Take 400 mg by mouth every 6 (six) hours as needed for headache or moderate pain.    Marland Kitchen levothyroxine (SYNTHROID, LEVOTHROID) 112 MCG tablet Take 1 tablet by mouth daily.    Marland Kitchen  lisinopril (PRINIVIL,ZESTRIL) 5 MG tablet Take 5 mg by mouth every morning.     . nitroGLYCERIN (NITRODUR - DOSED IN MG/24 HR) 0.3 mg/hr patch Place 1 patch (0.3 mg total) onto the skin as needed. (Patient not taking: Reported on 09/24/2017) 30 patch 0  . nitroGLYCERIN (NITROSTAT) 0.4 MG SL tablet Place 1 tablet (0.4 mg total) under the tongue every 5 (five) minutes as needed for chest pain. 25 tablet 4  . omeprazole (PRILOSEC) 40 MG capsule Take 1 capsule by mouth daily.    . rosuvastatin (CRESTOR) 40 MG tablet Take 40 mg by mouth every morning.       ROS: As per HPI. Deferred detailed ROS due to acuity of presentation.   Physical  Examination: There were no vitals taken for this visit.  HEENT: No visible signs of head trauma. Partially edentulous Lungs: Respirations unlabored Ext: No edema  Neurologic Examination: Mental Status: Alert, fully oriented except to "Friday". Speech fluent with intact naming and comprehension for simple motor commands. Made an error with 3 step directional command. No dysarthria. Repetition intact.  Cranial Nerves: II:  Visual fields intact with no extinction to DSS. PERRL.  III,IV, VI: No ptosis. EOMI. No nystagmus.  V,VII: Smile symmetric, facial temp sensation decreased on the right VIII: hearing intact to conversation IX,X: No hypophonia XI: No asymmetry XII: midline tongue extension  Motor: Right : Upper extremity   4+/5    Left:     Upper extremity   4+/5  Lower extremity   4+/5    Lower extremity   4+/5 Giveway weakness all 4 extremities with no asymmetry Sensory: Decreased temp sensation to RUE and RLE. FT intact. No extinction.  Deep Tendon Reflexes:  2+ bilateral brachioradialis. Hypoactive patellae and achilles bilaterally. Toes downgoing bilaterally.  Cerebellar: No ataxia with FNF and H-S bilaterally.  Gait: Deferred  Results for orders placed or performed during the hospital encounter of 09/16/19 (from the past 48 hour(s))  CBG monitoring, ED     Status: None   Collection Time: 09/16/19  8:07 PM  Result Value Ref Range   Glucose-Capillary 93 70 - 99 mg/dL   No results found.  Assessment: 63 y.o. male presenting with acute onset of right sided weakness symptoms precipitating falls x 2 at a drinking establishment. States he has had 4 episodes of transient right sided weakness in the past several weeks. Has a stated history of prior stroke with mild residual left sided weakness.  1. In the context of giveway weakness and inconsistent responses to sensory testing on exam, NIHSS is 2 2. Possible EtOH intoxication.  3. Stroke Risk Factors - Prior stroke, CAD, HLD, HTN  and tobacco use.  Recommendations: 1. CTA of head and neck has been completed with report pending. No LVO seen on preliminary review.  2. MRI of the brain without contrast 3. PT consult, OT consult, Speech consult 4. Echocardiogram 5. Continue ASA and simvastatin.  6. Frequent neuro checks 7. Risk factor modification to include smoking and EtOH cessation counseling  8. Telemetry monitoring 9. Permissive HTN for 24 hours.  10. IVF 11. EtOH level.  12. Urine toxicology screen.  13. HgbA1c, fasting lipid panel   @Electronically  signed: Dr. Kerney Elbe  09/16/2019, 8:16 PM

## 2019-09-16 NOTE — ED Triage Notes (Signed)
Pt BIB [facility] for eval as Code Stroke. Pt was at a bar, drank 6-8 beers, subsequently developed R sided weakness, had ground level fall. EMS reports on their arrival pt w/ R sided weakness, R sided drift. Speech slightly slurred, unsure if d/t ETOH or neuro deficits. On arrival, pt in c-collar d/t falls. EMS reports LSN 1800. Hx of CVA in 2016 affecting the L side, pt reports no residual. Cooperative on arrival, obvious ETOH.

## 2019-09-17 ENCOUNTER — Observation Stay (HOSPITAL_COMMUNITY): Payer: Medicare HMO

## 2019-09-17 ENCOUNTER — Observation Stay (HOSPITAL_BASED_OUTPATIENT_CLINIC_OR_DEPARTMENT_OTHER): Payer: Medicare HMO

## 2019-09-17 DIAGNOSIS — F17200 Nicotine dependence, unspecified, uncomplicated: Secondary | ICD-10-CM

## 2019-09-17 DIAGNOSIS — I6389 Other cerebral infarction: Secondary | ICD-10-CM | POA: Diagnosis not present

## 2019-09-17 DIAGNOSIS — E782 Mixed hyperlipidemia: Secondary | ICD-10-CM | POA: Diagnosis not present

## 2019-09-17 DIAGNOSIS — M25551 Pain in right hip: Secondary | ICD-10-CM | POA: Diagnosis not present

## 2019-09-17 DIAGNOSIS — F1010 Alcohol abuse, uncomplicated: Secondary | ICD-10-CM

## 2019-09-17 DIAGNOSIS — M5416 Radiculopathy, lumbar region: Secondary | ICD-10-CM

## 2019-09-17 DIAGNOSIS — R531 Weakness: Secondary | ICD-10-CM | POA: Diagnosis not present

## 2019-09-17 DIAGNOSIS — Z8673 Personal history of transient ischemic attack (TIA), and cerebral infarction without residual deficits: Secondary | ICD-10-CM | POA: Diagnosis not present

## 2019-09-17 LAB — HEMOGLOBIN A1C
Hgb A1c MFr Bld: 5.8 % — ABNORMAL HIGH (ref 4.8–5.6)
Mean Plasma Glucose: 119.76 mg/dL

## 2019-09-17 LAB — BASIC METABOLIC PANEL
Anion gap: 8 (ref 5–15)
BUN: 12 mg/dL (ref 8–23)
CO2: 25 mmol/L (ref 22–32)
Calcium: 9.3 mg/dL (ref 8.9–10.3)
Chloride: 107 mmol/L (ref 98–111)
Creatinine, Ser: 1.1 mg/dL (ref 0.61–1.24)
GFR calc Af Amer: >60 mL/min (ref >60)
GFR calc non Af Amer: >60 mL/min (ref >60)
Glucose, Bld: 92 mg/dL (ref 70–99)
Potassium: 3.7 mmol/L (ref 3.5–5.1)
Sodium: 140 mmol/L (ref 135–145)

## 2019-09-17 LAB — ECHOCARDIOGRAM COMPLETE
Height: 70 in
Weight: 3213.42 oz

## 2019-09-17 LAB — LIPID PANEL
Cholesterol: 201 mg/dL — ABNORMAL HIGH (ref 0–200)
HDL: 42 mg/dL (ref >40)
LDL Cholesterol: 117 mg/dL — ABNORMAL HIGH (ref 0–99)
Total CHOL/HDL Ratio: 4.8 RATIO
Triglycerides: 209 mg/dL — ABNORMAL HIGH (ref <150)
VLDL: 42 mg/dL — ABNORMAL HIGH (ref 0–40)

## 2019-09-17 LAB — PHOSPHORUS: Phosphorus: 4.5 mg/dL (ref 2.5–4.6)

## 2019-09-17 LAB — MAGNESIUM: Magnesium: 2 mg/dL (ref 1.7–2.4)

## 2019-09-17 LAB — HIV ANTIBODY (ROUTINE TESTING W REFLEX): HIV Screen 4th Generation wRfx: NONREACTIVE

## 2019-09-17 LAB — SARS CORONAVIRUS 2 (TAT 6-24 HRS): SARS Coronavirus 2: NEGATIVE

## 2019-09-17 MED ORDER — ASPIRIN EC 81 MG PO TBEC: 81.0000 mg | DELAYED_RELEASE_TABLET | Freq: Every day | ORAL | Status: DC

## 2019-09-17 MED ORDER — ASPIRIN EC 81 MG PO TBEC: 81.0000 mg | DELAYED_RELEASE_TABLET | Freq: Every day | ORAL | 0 refills | Status: AC

## 2019-09-17 MED ORDER — CLOPIDOGREL BISULFATE 300 MG PO TABS
300.0000 mg | ORAL_TABLET | Freq: Once | ORAL | Status: DC
  Filled 2019-09-17: qty 1

## 2019-09-17 MED ORDER — CLOPIDOGREL BISULFATE 75 MG PO TABS: 75.0000 mg | ORAL_TABLET | Freq: Every day | ORAL | Status: DC

## 2019-09-17 MED ORDER — FENOFIBRATE 160 MG PO TABS
160.0000 mg | ORAL_TABLET | Freq: Every day | ORAL | Status: DC
  Administered 2019-09-17: 160 mg via ORAL
  Filled 2019-09-17: qty 1

## 2019-09-17 NOTE — Progress Notes (Signed)
STROKE TEAM PROGRESS NOTE   INTERVAL HISTORY His son is at the bedside. Pt recounted HPI with me. Pt had a stroke 2-3 years ago and left with mild left hand weakness and left leg dragging on walking. He had LBP with right sciatica a couple of years ago and had surgery with Dr. Trenton Gammon Neurosurgery. He also had right hip pain recently and he had appointment with Dr. Gladstone Lighter on 09/25/19. However, for the last 10 days, he had 4 falls. Every time with fall, he had right hip pain, right tibial pain and right calf pain at the same time and his right leg gave away and he fell. For the first 3 times, he was able to get up by himself and then rested for a day, the second day he was able to be back to normal. Yesterday, he fell again and not able to get up by himself. After a night rest, this morning he is at his baseline. He said if he walks too long or too far, this may happen. So far stroke work up negative except old lacunar stroke on the right CR.   OBJECTIVE Vitals:   09/16/19 2230 09/17/19 0053 09/17/19 0150 09/17/19 0408  BP: 119/74 129/69 130/72 128/74  Pulse: (!) 57 (!) 52 61 65  Resp: 17 18 18 19   SpO2: 98% 98%  98%  Weight:      Height:        CBC:  Recent Labs  Lab 09/16/19 2018 09/16/19 2025  WBC 6.5  --   NEUTROABS 3.9  --   HGB 15.8 16.3  HCT 46.2 48.0  MCV 89.7  --   PLT 198  --     Basic Metabolic Panel:  Recent Labs  Lab 09/16/19 2018 09/16/19 2025 09/16/19 2258  NA 139 140  --   K 3.9 3.2*  --   CL 104 104  --   CO2 25  --   --   GLUCOSE 101* 97  --   BUN 11 11  --   CREATININE 1.09 1.20  --   CALCIUM 9.3  --   --   MG  --   --  2.0  PHOS  --   --  4.5    Lipid Panel:     Component Value Date/Time   CHOL 154 12/31/2011 0705   TRIG 126 12/31/2011 0705   HDL 71 12/31/2011 0705   CHOLHDL 2.2 12/31/2011 0705   VLDL 25 12/31/2011 0705   LDLCALC 58 12/31/2011 0705   HgbA1c:  Lab Results  Component Value Date   HGBA1C 5.6 12/31/2011   Urine Drug Screen:      Component Value Date/Time   LABOPIA NONE DETECTED 09/16/2019 2017   COCAINSCRNUR NONE DETECTED 09/16/2019 2017   LABBENZ POSITIVE (A) 09/16/2019 2017   AMPHETMU NONE DETECTED 09/16/2019 2017   THCU NONE DETECTED 09/16/2019 2017   LABBARB NONE DETECTED 09/16/2019 2017    Alcohol Level     Component Value Date/Time   ETH 216 (H) 09/16/2019 2015    IMAGING  CT Code Stroke CTA Head W/WO contrast CT Code Stroke CTA Neck W/WO contrast 09/16/2019 IMPRESSION:  1. No emergent large vessel occlusion within the Circle of Willis. No right MCA thrombus.  2. Atherosclerotic changes of the cavernous internal carotid arteries bilaterally without significant stenosis.  3. 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  4. Atherosclerotic changes at the carotid bifurcations bilaterally without a significant stenosis relative to the  more distal vessels.  5. Multilevel spondylosis of the cervical spine is most pronounced at C5-6 and C6-7 with osseous foraminal narrowing bilaterally, left greater than right.  6. No significant proximal stenosis, aneurysm, or branch vessel occlusion within the Circle of Willis.  7. Aortic Atherosclerosis (ICD10-I70.0).   MR Brain Wo Contrast (neuro protocol) 09/17/2019 IMPRESSION:  1. No acute intracranial abnormality.  2. Generalized atrophy without lobar predilection.  3. Old small vessel infarct of the right centrum semiovale.    CT HEAD CODE STROKE WO CONTRAST 09/16/2019 IMPRESSION:  1. Remote white matter infarct adjacent to the right lateral ventricle.  2. No acute cortical abnormality.  3. Question hyperdense right M1 segment suggesting thrombus. Recommend CTA of the head and neck for further evaluation.  4. ASPECTS is 10/10     Transthoracic Echocardiogram  00/00/2020 Pending   ECG - SB rate 51 BPM. (See cardiology reading for complete details)   PHYSICAL EXAM  Temp:  [97.5 F (36.4 C)-98.4 F (36.9 C)] 97.5 F  (36.4 C) (12/27 1152) Pulse Rate:  [52-65] 55 (12/27 1152) Resp:  [16-19] 16 (12/27 1152) BP: (119-169)/(69-84) 169/84 (12/27 1152) SpO2:  [96 %-98 %] 96 % (12/27 1152) Weight:  [91.1 kg] 91.1 kg (12/26 2132)  General - Well nourished, well developed, in no apparent distress.  Ophthalmologic - fundi not visualized due to noncooperation.  Cardiovascular - Regular rhythm and rate.  Mental Status -  Level of arousal and orientation to time, place, and person were intact. Language including expression, naming, repetition, comprehension was assessed and found intact.  Cranial Nerves II - XII - II - Visual field intact OU. III, IV, VI - Extraocular movements intact. V - Facial sensation intact bilaterally. VII - Facial movement intact bilaterally. VIII - Hearing & vestibular intact bilaterally. X - Palate elevates symmetrically, poor denture. XI - Chin turning & shoulder shrug intact bilaterally. XII - Tongue protrusion intact.  Motor Strength - The patient's strength was normal in all extremities and pronator drift was absent.  Bulk was normal and fasciculations were absent.   Motor Tone - Muscle tone was assessed at the neck and appendages and was normal.  Reflexes - The patient's reflexes were symmetrical in all extremities and he had no pathological reflexes.  Sensory - Light touch, temperature/pinprick were assessed and were symmetrical.    Coordination - The patient had normal movements in the hands with no ataxia or dysmetria.  Tremor was absent.  Gait and Station - deferred.   ASSESSMENT/PLAN Mr. TERRIAL MIZER is a 63 y.o. male with history of prior stroke (residual mild left sided weakness), CAD, HLD, HTN, hypothyroidism and tobacco use presenting with episodes of  intermittent right sided weakness over several weeks with multiple falls.  He did not receive IV t-PA due to no localization of deficits.   Recurrent falls with right hip, tibial and calf pain - no arm or  facial symptoms  Code Stroke CT Head - Remote white matter infarct adjacent to the right lateral ventricle.   MRI head - No acute intracranial abnormality. Old small vessel infarct of the right centrum semiovale.   CTA H&N - atherosclerosis ICA bulbs without stenosis. There is no left subclavian artery stenosis.   Will do MRI L spine to rule out radiculopathy  2D Echo EF 60-65%  [employer] Virus 2 - negative  LDL - 117  HgbA1c 5.8  UDS - benzodiazepines (Rx)  VTE prophylaxis - Lovenox  aspirin 81 mg daily prior to  admission, now on aspirin 81 mg daily. Continue on discharge.  Patient counseled to be compliant with his antithrombotic medications  Ongoing aggressive stroke risk factor management  Therapy recommendations: none  Disposition:  Pending  Right hip pain  He had appointment with Dr. Gladstone Lighter on 09/25/19  Felt like his right hip pain and leg pain likely the cause of his falls  Follow up with Dr. Gladstone Lighter as scheduled.  Hx of right LBP with sciatica   S/p surgery repair with Dr. Trenton Gammon in the past  Will repeat MRI L spine for evaluation to rule out radiculopathy as the cause of his falls  Hypertension  Home BP meds: Lisinopril  Current BP meds: none   Stable . Long-term BP goal normotensive  Hyperlipidemia  Home Lipid lowering medication: Crestor 40 mg daily and Tricor 145 mg daily  LDL 58, goal < 70  Current lipid lowering medication: Crestor 40 mg daily and fenofibrate   Continue statin at discharge  Hx of stroke  Left with mild left hand weakness and left leg dragging on walking as per pt  However, no deficit on exam  Continue ASA 81 and statin for stroke prevention  PT/OT no recs  Tobacco abuse  Current smoker  Smoking cessation counseling provided  Pt is willing to quit  Alcohol abuse  advised to drink no more than 2 alcoholic beverage per day  On MVI/B1/FA  CIWA protocol  Other Stroke Risk Factors  Advanced  age  Overweight, Body mass index is 28.82 kg/m., recommend weight loss, diet and exercise as appropriate   Coronary artery disease  Other Active Problems  Multilevel spondylosis of the cervical spine is most pronounced at C5-6 and C6-7 with osseous foraminal narrowing bilaterally, left greater than right.   Hospital day # 0  Rosalin Hawking, MD PhD Stroke Neurology 09/17/2019 12:37 PM  To contact Stroke Continuity provider, please refer to [email or web address] After hours, contact General Neurology

## 2019-09-17 NOTE — Progress Notes (Signed)
Pt given discharge summary and discharged home via son as transportation.  

## 2019-09-17 NOTE — Progress Notes (Signed)
  Echocardiogram 2D Echocardiogram has been performed.  Jennette Dubin 09/17/2019, 8:55 AM

## 2019-09-17 NOTE — Evaluation (Signed)
Occupational Therapy Evaluation Patient Details Name: Carl [name] MRN: [id number] DOB: 20-Aug-1956 Today's Date: 09/17/2019    History of Present Illness Carl [name] is a 63 y.o. male with medical history significant of prior stroke with residual L sided mild weakness, HTN, HLD.Patient presenting to ED from Bar where he was drinking and playing billiards this evening.  Had sudden onset R sided weakness, fell.  Got up then fell again down 3 stairs.EMS called.  Pt drank 6 beers prior to falling.  Came in to ED as code stroke.   Clinical Impression   Patient evaluated by Occupational Therapy with no further acute OT needs identified. All education has been completed and the patient has no further questions. Pt appears to be at baseline level of functioning.  Discussed use of tub seat with him.  See below for any follow-up Occupational Therapy or equipment needs. OT is signing off. Thank you for this referral.      Follow Up Recommendations  No OT follow up;Supervision - Intermittent    Equipment Recommendations  None recommended by OT    Recommendations for Other Services       Precautions / Restrictions Precautions Precautions: Fall      Mobility Bed Mobility Overal bed mobility: Independent                Transfers Overall transfer level: Independent                    Balance Overall balance assessment: Needs assistance Sitting-balance support: No upper extremity supported;Feet supported Sitting balance-Leahy Scale: Fair     Standing balance support: No upper extremity supported;During functional activity Standing balance-Leahy Scale: Fair Standing balance comment: Walked to bathroom and tried to urinate.  Steady static stance.  Washed hands at sink and steady.  Pt supervision for dynamic gait.                            ADL either performed or assessed with clinical judgement   ADL Overall ADL's : Needs  assistance/impaired Eating/Feeding: Independent   Grooming: Wash/dry hands;Wash/dry face;Oral care;Brushing hair;Supervision/safety;Standing   Upper Body Bathing: Set up;Sitting   Lower Body Bathing: Set up;Sit to/from stand   Upper Body Dressing : Supervision/safety;Standing   Lower Body Dressing: Supervision/safety;Sit to/from stand   Toilet Transfer: Supervision/safety;Ambulation;Comfort height toilet;Grab bars;RW   Toileting- Clothing Manipulation and Hygiene: Supervision/safety;Sit to/from stand   Tub/ Shower Transfer: Min guard;Ambulation Tub/Shower Transfer Details (indicate cue type and reason): discussed use of tub seat with pt - he reports he feels he is at baseline  Functional mobility during ADLs: Supervision/safety       Vision Baseline Vision/History: No visual deficits Patient Visual Report: No change from baseline       Perception Perception Perception Tested?: Yes   Praxis Praxis Praxis tested?: Within functional limits    Pertinent Vitals/Pain Pain Assessment: Faces Faces Pain Scale: Hurts little more Pain Location: right hip Pain Descriptors / Indicators: Aching Pain Intervention(s): Monitored during session     Hand Dominance Right   Extremity/Trunk Assessment Upper Extremity Assessment Upper Extremity Assessment: Overall WFL for tasks assessed   Lower Extremity Assessment Lower Extremity Assessment: Defer to PT evaluation   Cervical / Trunk Assessment Cervical / Trunk Assessment: Normal   Communication Communication Communication: No difficulties   Cognition Arousal/Alertness: Awake/alert Behavior During Therapy: WFL for tasks assessed/performed Overall Cognitive Status: Within Functional Limits for tasks assessed  General Comments: grossly assessed.  Pt somewhat reserved making full assessment difficult    General Comments       Exercises     Shoulder Instructions      Home  Living Family/patient expects to be discharged to:: Private residence Living Arrangements: Spouse/significant other;Children(son handicapped and uses walker) Available Help at Discharge: Family;Available 24 hours/day(wife home 24 hours and pt retired) Type of Home: House Home Access: Stairs to enter Technical brewer of Steps: 2 Entrance Stairs-Rails: Left Home Layout: One level     Bathroom Shower/Tub: Teacher, early years/pre: Standard Bathroom Accessibility: Yes   Home Equipment: Cane - single point          Prior Functioning/Environment Level of Independence: Independent                 OT Problem List: Impaired balance (sitting and/or standing);Decreased activity tolerance      OT Treatment/Interventions:      OT Goals(Current goals can be found in the care plan section) Acute Rehab OT Goals Patient Stated Goal: to go home OT Goal Formulation: All assessment and education complete, DC therapy  OT Frequency:     Barriers to D/C:            Co-evaluation              AM-PAC OT "6 Clicks" Daily Activity     Outcome Measure Help from another person eating meals?: None Help from another person taking care of personal grooming?: None Help from another person toileting, which includes using toliet, bedpan, or urinal?: None Help from another person bathing (including washing, rinsing, drying)?: None Help from another person to put on and taking off regular upper body clothing?: None Help from another person to put on and taking off regular lower body clothing?: None 6 Click Score: 24   End of Session Equipment Utilized During Treatment: Gait belt Nurse Communication: Mobility status  Activity Tolerance: Patient tolerated treatment well Patient left: in chair;with call bell/phone within reach;with chair alarm set  OT Visit Diagnosis: Unsteadiness on feet (R26.81)                Time: [id number] OT Time Calculation (min): 12 min Charges:   OT General Charges $OT Visit: 1 Visit OT Evaluation $OT Eval Low Complexity: 1 Low  Nilsa Nutting., OTR/L Acute Rehabilitation Services Pager [phone] Office [phone]   Lucille Passy M 09/17/2019, 3:23 PM

## 2019-09-17 NOTE — Discharge Instructions (Signed)
Alcohol Abuse and Dependence Information, Adult Alcohol is a widely available drug. People drink alcohol in different amounts. People who drink alcohol very often and in large amounts often have problems during and after drinking. They may develop what is called an alcohol use disorder. There are two main types of alcohol use disorders:  Alcohol abuse. This is when you use alcohol too much or too often. You may use alcohol to make yourself feel happy or to reduce stress. You may have a hard time setting a limit on the amount you drink.  Alcohol dependence. This is when you use alcohol consistently for a period of time, and your body changes as a result. This can make it hard to stop drinking because you may start to feel sick or feel different when you do not use alcohol. These symptoms are known as withdrawal. How can alcohol abuse and dependence affect me? Alcohol abuse and dependence can have a negative effect on your life. Drinking too much can lead to addiction. You may feel like you need alcohol to function normally. You may drink alcohol before work in the morning, during the day, or as soon as you get home from work in the evening. These actions can result in:  Poor work performance.  Job loss.  Financial problems.  Car crashes or criminal charges from driving after drinking alcohol.  Problems in your relationships with friends and family.  Losing the trust and respect of coworkers, friends, and family. Drinking heavily over a long period of time can permanently damage your body and brain, and can cause lifelong health issues, such as:  Damage to your liver or pancreas.  Heart problems, high blood pressure, or stroke.  Certain cancers.  Decreased ability to fight infections.  Brain or nerve damage.  Depression.  Early (premature) death. If you are careless or you crave alcohol, it is easy to drink more than your body can handle (overdose). Alcohol overdose is a serious  situation that requires hospitalization. It may lead to permanent injuries or death. What can increase my risk?  Having a family history of alcohol abuse.  Having depression or other mental health conditions.  Beginning to drink at an early age.  Binge drinking often.  Experiencing trauma, stress, and an unstable home life during childhood.  Spending time with people who drink often. What actions can I take to prevent or manage alcohol abuse and dependence?  Do not drink alcohol if: ? Your health care provider tells you not to drink. ? You are pregnant, may be pregnant, or are planning to become pregnant.  If you drink alcohol: ? Limit how much you use to:  0-1 drink a day for women.  0-2 drinks a day for men. ? Be aware of how much alcohol is in your drink. In the [place] one drink equals one 12 oz bottle of beer (355 mL), one 5 oz glass of wine (148 mL), or one 1 oz glass of hard liquor (44 mL).  Stop drinking if you have been drinking too much. This can be very hard to do if you are used to abusing alcohol. If you begin to have withdrawal symptoms, talk with your health care provider or a person that you trust. These symptoms may include anxiety, shaky hands, headache, nausea, sweating, or not being able to sleep.  Choose to drink nonalcoholic beverages in social gatherings and places where there may be alcohol. Activity  Spend more time on activities that you enjoy that do   not involve alcohol, like hobbies or exercise.  Find healthy ways to cope with stress, such as exercise, meditation, or spending time with people you care about. General information  Talk to your family, coworkers, and friends about supporting you in your efforts to stop drinking. If they drink, ask them not to drink around you. Spend more time with people who do not drink alcohol.  If you think that you have an alcohol dependency problem: ? Tell friends or family about your concerns. ? Talk with your  health care provider or another health professional about where to get help. ? Work with a therapist and a chemical dependency counselor. ? Consider joining a support group for people who struggle with alcohol abuse and dependence. Where to find support   Your health care provider.  SMART Recovery: www.smartrecovery.org Therapy and support groups  Local treatment centers or chemical dependency counselors.  Local AA groups in your community: www.aa.org Where to find more information  Centers for Disease Control and Prevention: www.cdc.gov  National Institute on Alcohol Abuse and Alcoholism: www.niaaa.nih.gov  Alcoholics Anonymous (AA): www.aa.org Contact a health care provider if:  You drank more or for longer than you intended on more than one occasion.  You tried to stop drinking or to cut back on how much you drink, but you were not able to.  You often drink to the point of vomiting or passing out.  You want to drink so badly that you cannot think about anything else.  You have problems in your life due to drinking, but you continue to drink.  You keep drinking even though you feel anxious, depressed, or have experienced memory loss.  You have stopped doing the things you used to enjoy in order to drink.  You have to drink more than you used to in order to get the effect you want.  You experience anxiety, sweating, nausea, shakiness, and trouble sleeping when you try to stop drinking. Get help right away if:  You have thoughts about hurting yourself or others.  You have serious withdrawal symptoms, including: ? Confusion. ? Racing heart. ? High blood pressure. ? Fever. If you ever feel like you may hurt yourself or others, or have thoughts about taking your own life, get help right away. You can go to your nearest emergency department or call:  Your local emergency services (911 in the [place]  A suicide crisis helpline, such as the National Suicide Prevention  Lifeline at 1-800-273-8255. This is open 24 hours a day. Summary  Alcohol abuse and dependence can have a negative effect on your life. Drinking too much or too often can lead to addiction.  If you drink alcohol, limit how much you use.  If you are having trouble keeping your drinking under control, find ways to change your behavior. Hobbies, calming activities, exercise, or support groups can help.  If you feel you need help with changing your drinking habits, talk with your health care provider, a good friend, or a therapist, or go to an AA group. This information is not intended to replace advice given to you by your health care provider. Make sure you discuss any questions you have with your health care provider. Document Released: 09/01/2016 Document Revised: 12/27/2018 Document Reviewed: 11/15/2018 Elsevier Patient Education  2020 [employer]  

## 2019-09-17 NOTE — Evaluation (Signed)
Physical Therapy Evaluation Patient Details Name: Carl [name] MRN: [id number] DOB: Apr 14, 1956 Today's Date: 09/17/2019   History of Present Illness  Carl [name] is a 63 y.o. male with medical history significant of prior stroke with residual L sided mild weakness, HTN, HLD.Patient [place] ED from [place] where he was drinking and playing billiards this evening.  Had sudden onset R sided weakness, fell.  Got up then fell again down 3 stairs.EMS called.  Pt drank 6 beers prior to falling.  Came in to ED as code stroke.  Clinical Impression  Pt admitted with above diagnosis. Pt was able to ambulate in hallway with supervision and no LOB with min challenges.  Does not need a device currently for stability. Pt reports his right hip at times gives out and has appt Jan 4 with orthopedic MD.  Encouraged pt to use cane prn at home if right hip is bothering him.  Pt agrees. He states his gait is at baseline.  Will follow acutely but pt does not need PT at home.   Pt currently with functional limitations due to the deficits listed below (see PT Problem List). Pt will benefit from skilled PT to increase their independence and safety with mobility to allow discharge to the venue listed below.      Follow Up Recommendations No PT follow up    Equipment Recommendations  None recommended by PT    Recommendations for Other Services       Precautions / Restrictions Precautions Precautions: Fall Restrictions Weight Bearing Restrictions: No      Mobility  Bed Mobility Overal bed mobility: Independent                Transfers Overall transfer level: Independent                  Ambulation/Gait Ambulation/Gait assistance: Min guard;Supervision Gait Distance (Feet): 150 Feet Assistive device: None Gait Pattern/deviations: Step-through pattern;Decreased stride length   Gait velocity interpretation: 1.31 - 2.62 ft/sec, indicative of limited community ambulator General Gait  Details: Pt able to ambulate in room and hallway without LOB.  Pt states this is his baseline gait and that due to right hip pain he only walks short distances.  Pt overall with steady gait with min challenges.   Stairs            Wheelchair Mobility    Modified Rankin (Stroke Patients Only) Modified Rankin (Stroke Patients Only) Pre-Morbid Rankin Score: No significant disability Modified Rankin: Slight disability     Balance Overall balance assessment: Needs assistance Sitting-balance support: No upper extremity supported;Feet supported Sitting balance-Leahy Scale: Fair     Standing balance support: No upper extremity supported;During functional activity Standing balance-Leahy Scale: Fair Standing balance comment: Walked to bathroom and tried to urinate.  Steady static stance.  Washed hands at sink and steady.  Pt supervision for dynamic gait.                              Pertinent Vitals/Pain Pain Assessment: Faces Faces Pain Scale: Hurts whole lot Pain Location: right hip Pain Descriptors / Indicators: Aching Pain Intervention(s): Limited activity within patient's tolerance;Monitored during session;Repositioned    Home Living Family/patient expects to be discharged to:: Private residence Living Arrangements: Spouse/significant other;Children(son handicapped and uses walker) Available Help at Discharge: Family;Available 24 hours/day(wife home 24 hours and pt retired) Type of Home: House Home Access: Stairs to enter Entrance Stairs-Rails: Left  Entrance Stairs-Number of Steps: 2 Home Layout: One level Home Equipment: Cane - single point      Prior Function Level of Independence: Independent               Hand Dominance   Dominant Hand: Right    Extremity/Trunk Assessment   Upper Extremity Assessment Upper Extremity Assessment: Defer to OT evaluation    Lower Extremity Assessment Lower Extremity Assessment: Generalized weakness     Cervical / Trunk Assessment Cervical / Trunk Assessment: Normal  Communication   Communication: No difficulties  Cognition Arousal/Alertness: Awake/alert Behavior During Therapy: WFL for tasks assessed/performed Overall Cognitive Status: Within Functional Limits for tasks assessed                                        General Comments      Exercises     Assessment/Plan    PT Assessment Patient needs continued PT services  PT Problem List Decreased mobility;Decreased activity tolerance       PT Treatment Interventions DME instruction;Gait training;Stair training;Functional mobility training;Therapeutic activities;Therapeutic exercise;Balance training;Patient/family education    PT Goals (Current goals can be found in the Care Plan section)  Acute Rehab PT Goals Patient Stated Goal: to go home PT Goal Formulation: With patient Time For Goal Achievement: 10/01/19 Potential to Achieve Goals: Good    Frequency Min 4X/week   Barriers to discharge        Co-evaluation               AM-PAC PT "6 Clicks" Mobility  Outcome Measure Help needed turning from your back to your side while in a flat bed without using bedrails?: None Help needed moving from lying on your back to sitting on the side of a flat bed without using bedrails?: None Help needed moving to and from a bed to a chair (including a wheelchair)?: None Help needed standing up from a chair using your arms (e.g., wheelchair or bedside chair)?: None Help needed to walk in hospital room?: A Little Help needed climbing 3-5 steps with a railing? : A Little 6 Click Score: 22    End of Session Equipment Utilized During Treatment: Gait belt Activity Tolerance: Patient tolerated treatment well Patient left: in chair;with call bell/phone within reach;with chair alarm set Nurse Communication: Mobility status PT Visit Diagnosis: Muscle weakness (generalized) (M62.81)    Time: [id number] PT Time  Calculation (min) (ACUTE ONLY): 20 min   Charges:   PT Evaluation $PT Eval Moderate Complexity: 1 Mod          Yohanna Tow W,PT Acute Rehabilitation Services Pager:  [phone]  Office:  [phone]    Denice Paradise 09/17/2019, 10:08 AM

## 2019-09-17 NOTE — Discharge Summary (Signed)
Physician Discharge Summary  Carl [name] [id number] DOB: Sep 06, 1956 DOA: 09/16/2019  PCP: Carl Aly, FNP  Admit date: 09/16/2019 Discharge date: 09/17/2019  Admitted From: Home Disposition: Home  Recommendations for Outpatient Follow-up:  1. Follow up with PCP in 1-2 weeks 2. Follow with Dr. Gladstone [name] on 09/25/19 3. Please obtain BMP/CBC in one week 4. Please follow up on the following pending results:  Home Health: None Equipment/Devices: None  Discharge Condition: Stable CODE STATUS: Full code Diet recommendation: Cardiac  Subjective: Seen and examined.  No complaints.  Alert and oriented.   HPI: Carl [name] is a 63 y.o. [name] with medical history significant of prior stroke with residual L sided mild weakness, HTN, HLD.  Patient presenting to ED from [place] where he was drinking and playing billiards this evening.  Had sudden onset R sided weakness, fell.  Got up then fell again down 3 stairs.  EMS called.  Pt drank 6 beers prior to falling.  Came in to ED as code stroke.  Patient with intermittent weakness of R side for several weeks, 4 episodes, all with complete recover of strength.  ED Course: EtOH level 210.  CT head and neck neg.  CTA head and neck neg for emergent occlusion.   Brief/Interim Summary: Patient was admitted for further work-up of stroke secondary to right-sided weakness.  He had CT head code stroke followed by CT angiogram of the head and neck and MRI of the brain and all of them were negative however CT angiogram of the neck was read by radiologist as having high-grade stenosis of left vertebral artery.  I discussed personally with Dr. Erlinda [name] from neurology who had personally reviewed the scans and said that there is no stenosis after his personal review.  Transthoracic echo showed normal ejection fraction with possible diastolic dysfunction but no wall motion abnormality or PFO.  Due to history of recurrent falls with right hip, tibial and calf  pain, neurology obtained MRI lumbar spine which showed soft disc protrusions into both neural foramina at L5-S1 which could affect either or both L5 nerves. Because the patient has had prior surgery at L5-S1 on the right, the abnormality in the right neural foramen could represent scarring. Follow-up MRI images with contrast recommended for further characterization.Marland Kitchen  He was evaluated by PT OT and was thought to be at his baseline which he uses a cane.  I discussed with Dr. Erlinda [name] over the phone who recommended further outpatient work-up for possible MRI lumbar spine with contrast but defer this to patient's neurosurgeon and recommended that patient should follow with Dr. Trenton [name], his neurosurgeon.  I also discussed with patient himself and he also agrees with following up with Dr. Trenton [name] for further work-up if indicated.  He was cleared by PT OT as well as neurology to go home.  He will resume his home dose of statins and aspirin.  No new medications were recommended.  He is being discharged in stable condition.  Discharge Diagnoses:  Principal Problem:   Acute right-sided weakness Active Problems:   Hypertension   History of CVA (cerebrovascular accident)   Alcohol intoxication Carl [name])    Discharge Instructions  Discharge Instructions    Discharge patient   Complete by: As directed    Discharge disposition: 01-Home or Self Care   Discharge patient date: 09/17/2019     Allergies as of 09/17/2019   No Known Allergies     Medication List    TAKE these medications   aspirin EC 81  MG tablet Take 1 tablet (81 mg total) by mouth at bedtime.   fenofibrate 145 MG tablet Commonly known as: TRICOR Take 145 mg by mouth every morning.   ibuprofen 200 MG tablet Commonly known as: ADVIL Take 400 mg by mouth every 6 (six) hours as needed for headache or moderate pain.   levothyroxine 112 MCG tablet Commonly known as: SYNTHROID Take 112 mcg by mouth daily.   lisinopril 5 MG tablet Commonly  known as: ZESTRIL Take 5 mg by mouth every morning.   nitroGLYCERIN 0.4 MG SL tablet Commonly known as: NITROSTAT Place 1 tablet (0.4 mg total) under the tongue every 5 (five) minutes as needed for chest pain.   nitroGLYCERIN 0.3 mg/hr patch Commonly known as: NITRODUR - Dosed in mg/24 hr Place 1 patch (0.3 mg total) onto the skin as needed.   omeprazole 40 MG capsule Commonly known as: PRILOSEC Take 40 mg by mouth daily.   rosuvastatin 40 MG tablet Commonly known as: CRESTOR Take 40 mg by mouth every morning.      Follow-up Information    Carl Aly, FNP Follow up in 1 week(s).   Specialty: Nurse Practitioner Contact information: [place] [address] [phone]        Carl Larsson, MD Follow up in 2 week(s).   Specialty: Neurosurgery Contact information: [address] [place] [address] [phone]          No Known Allergies  Consultations: Neurology   Procedures/Studies: CT Code Stroke CTA Head W/WO contrast  Result Date: 09/16/2019 CLINICAL DATA:  Code stroke. Acute onset of left-sided weakness and ataxia. EXAM: CT ANGIOGRAPHY HEAD AND NECK TECHNIQUE: Multidetector CT imaging of the head and neck was performed using the standard protocol during bolus administration of intravenous contrast. Multiplanar CT image reconstructions and MIPs were obtained to evaluate the vascular anatomy. Carotid stenosis measurements (when applicable) are obtained utilizing NASCET criteria, using the distal internal carotid diameter as the denominator. CONTRAST:  113mL OMNIPAQUE IOHEXOL 350 MG/ML SOLN COMPARISON:  CT head without contrast 09/16/2019 FINDINGS: CTA NECK FINDINGS Aortic arch: A 3 vessel arch configuration is present. Atherosclerotic calcifications are present at the origin of the left subclavian artery. Calcifications are present in the distal arch. There is no significant stenosis of the great vessel origins.  No aneurysm is present. Right carotid system: The right common carotid artery is within normal limits. Atherosclerotic calcifications are present at the bifurcation. The lumen is narrowed to 2.5 mm. There is no significant stenosis of greater than 50% relative to the more distal vessel. Left carotid system: The left common carotid artery is within normal limits. Atherosclerotic calcifications are present at the proximal left ICA without a significant stenosis relative to the more distal vessel. There is mild tortuosity in the distal cervical left ICA. Mural calcification is present just below the skull base without a significant stenosis. Vertebral arteries: The vertebral arteries are codominant. 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 Calcification is present at the origin of the right subclavian artery without a significant stenosis. No other significant stenosis is present in either vertebral artery in the neck. Skeleton: Degenerative changes the cervical spine are most prominent at C5-6 and C6-7 with osseous foraminal narrowing bilaterally, left greater than right. No focal lytic or blastic lesions are present. Advanced dental disease is noted. Other neck: The soft tissues of the neck otherwise within limits. Upper chest: Dependent atelectasis present in  visualized lung fields. No focal nodule, mass, or airspace disease is present. Review of the MIP images confirms the above findings CTA HEAD FINDINGS Anterior circulation: Atherosclerotic calcifications are present within the cavernous internal carotid arteries bilaterally. There is no significant stenosis relative to the ICA terminus. The A1 and M1 segments are normal. No significant anterior communicating artery is visualized. MCA bifurcations are intact. The ACA and MCA branch vessels are within normal limits. Posterior circulation: Vertebral arteries are within normal limits. PICA origins are visualized and normal. Both  posterior cerebral arteries originate from the basilar tip. The PCA branch vessels are within normal limits. Venous sinuses: The dural sinuses are patent. Straight sinus deep cerebral veins are intact. Cortical veins are within normal limits. No vascular malformations are present. Anatomic variants: None Review of the MIP images confirms the above findings IMPRESSION: 1. No emergent large vessel occlusion within the Circle of Willis. No right MCA thrombus. 2. Atherosclerotic changes of the cavernous internal carotid arteries bilaterally without significant stenosis. 3. 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 4. Atherosclerotic changes at the carotid bifurcations bilaterally without a significant stenosis relative to the more distal vessels. 5. Multilevel spondylosis of the cervical spine is most pronounced at C5-6 and C6-7 with osseous foraminal narrowing bilaterally, left greater than right. 6. No significant proximal stenosis, aneurysm, or branch vessel occlusion within the Circle of Willis. 7. Aortic Atherosclerosis (ICD10-I70.0). The above was relayed via text pager to Dr. Kerney Elbe on 09/16/2019 at 20:52 . Electronically Signed   By: San Morelle M.D.   On: 09/16/2019 21:00   CT Code Stroke CTA Neck W/WO contrast  Result Date: 09/16/2019 CLINICAL DATA:  Code stroke. Acute onset of left-sided weakness and ataxia. EXAM: CT ANGIOGRAPHY HEAD AND NECK TECHNIQUE: Multidetector CT imaging of the head and neck was performed using the standard protocol during bolus administration of intravenous contrast. Multiplanar CT image reconstructions and MIPs were obtained to evaluate the vascular anatomy. Carotid stenosis measurements (when applicable) are obtained utilizing NASCET criteria, using the distal internal carotid diameter as the denominator. CONTRAST:  160mL OMNIPAQUE IOHEXOL 350 MG/ML SOLN COMPARISON:  CT head without contrast 09/16/2019 FINDINGS: CTA NECK FINDINGS Aortic  arch: A 3 vessel arch configuration is present. Atherosclerotic calcifications are present at the origin of the left subclavian artery. Calcifications are present in the distal arch. There is no significant stenosis of the great vessel origins. No aneurysm is present. Right carotid system: The right common carotid artery is within normal limits. Atherosclerotic calcifications are present at the bifurcation. The lumen is narrowed to 2.5 mm. There is no significant stenosis of greater than 50% relative to the more distal vessel. Left carotid system: The left common carotid artery is within normal limits. Atherosclerotic calcifications are present at the proximal left ICA without a significant stenosis relative to the more distal vessel. There is mild tortuosity in the distal cervical left ICA. Mural calcification is present just below the skull base without a significant stenosis. Vertebral arteries: The vertebral arteries are codominant. 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 Calcification is present at the origin of the right subclavian artery without a significant stenosis. No other significant stenosis is present in either vertebral artery in the neck. Skeleton: Degenerative changes the cervical spine are most prominent at C5-6 and C6-7 with osseous foraminal narrowing bilaterally, left greater than right. No focal lytic or blastic lesions are present. Advanced dental disease is noted. Other neck:  The soft tissues of the neck otherwise within limits. Upper chest: Dependent atelectasis present in visualized lung fields. No focal nodule, mass, or airspace disease is present. Review of the MIP images confirms the above findings CTA HEAD FINDINGS Anterior circulation: Atherosclerotic calcifications are present within the cavernous internal carotid arteries bilaterally. There is no significant stenosis relative to the ICA terminus. The A1 and M1 segments are normal. No significant  anterior communicating artery is visualized. MCA bifurcations are intact. The ACA and MCA branch vessels are within normal limits. Posterior circulation: Vertebral arteries are within normal limits. PICA origins are visualized and normal. Both posterior cerebral arteries originate from the basilar tip. The PCA branch vessels are within normal limits. Venous sinuses: The dural sinuses are patent. Straight sinus deep cerebral veins are intact. Cortical veins are within normal limits. No vascular malformations are present. Anatomic variants: None Review of the MIP images confirms the above findings IMPRESSION: 1. No emergent large vessel occlusion within the Circle of Willis. No right MCA thrombus. 2. Atherosclerotic changes of the cavernous internal carotid arteries bilaterally without significant stenosis. 3. 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 4. Atherosclerotic changes at the carotid bifurcations bilaterally without a significant stenosis relative to the more distal vessels. 5. Multilevel spondylosis of the cervical spine is most pronounced at C5-6 and C6-7 with osseous foraminal narrowing bilaterally, left greater than right. 6. No significant proximal stenosis, aneurysm, or branch vessel occlusion within the Circle of Willis. 7. Aortic Atherosclerosis (ICD10-I70.0). The above was relayed via text pager to Dr. Kerney Elbe on 09/16/2019 at 20:52 . Electronically Signed   By: San Morelle M.D.   On: 09/16/2019 21:00   MR Brain Wo Contrast (neuro protocol)  Result Date: 09/17/2019 CLINICAL DATA:  Neurologic deficit, stroke suspected. EXAM: MRI HEAD WITHOUT CONTRAST TECHNIQUE: Multiplanar, multiecho pulse sequences of the brain and surrounding structures were obtained without intravenous contrast. COMPARISON:  Brain MRI 12/31/2011 Head CT 09/16/2019 FINDINGS: BRAIN: There is no acute infarct, acute hemorrhage or extra-axial collection. Old small vessel infarct of the right  periventricular white matter. Parenchymal signal is otherwise normal. There is generalized atrophy without lobar predilection. The midline structures are normal. VASCULAR: The major intracranial arterial and venous sinus flow voids are normal. Susceptibility-sensitive sequences show a single focus of chronic microhemorrhage in the right medial temporal lobe. SKULL AND UPPER CERVICAL SPINE: Calvarial bone marrow signal is normal. There is no skull base mass. The visualized upper cervical spine and soft tissues are normal. SINUSES/ORBITS: There are no fluid levels or advanced mucosal thickening. The mastoid air cells and middle ear cavities are free of fluid. The orbits are normal. IMPRESSION: 1. No acute intracranial abnormality. 2. Generalized atrophy without lobar predilection. 3. Old small vessel infarct of the right centrum semiovale. Electronically Signed   By: Ulyses Jarred M.D.   On: 09/17/2019 04:02   MR LUMBAR SPINE WO CONTRAST  Result Date: 09/17/2019 CLINICAL DATA:  Lumbar radiculopathy with right hip and leg pain. Multiple recent falls. EXAM: MRI LUMBAR SPINE WITHOUT CONTRAST TECHNIQUE: Multiplanar, multisequence MR imaging of the lumbar spine was performed. No intravenous contrast was administered. COMPARISON:  CT myelogram dated 10/06/2017 and lumbar MRI dated 07/19/2017 FINDINGS: Segmentation:  Standard. Alignment:  Minimal retrolisthesis of L1 on L2 and of L2 on L3. Vertebrae:  No fracture, evidence of discitis, or bone lesion. Conus medullaris and cauda equina: Conus extends to the L1-2 level. Conus and cauda equina appear normal. Paraspinal and other soft tissues:  Sigmoid diverticulosis. Otherwise negative. Disc levels: L1-2: Disc degeneration with slight disc space narrowing. Small broad-based disc bulge with accompanying osteophytes, slightly increased since the prior study but without neural impingement. Minimal retrolisthesis. L2-3: Slight disc desiccation with chronic slight retrolisthesis  with a small broad-based disc bulge with accompanying osteophytes without neural impingement, minimally increased since the prior study. L3-4: Negative. L4-5: Small disc bulges into the neural foramina without neural impingement, unchanged. No foraminal stenosis. No significant facet arthritis. L5-S1: Chronic broad-based disc bulge with small protrusions into both neural foramina. Previous surgery on the right. The right L5 nerve may be impinged upon in the neural foramen. Since this is the level of prior surgery, the density in the neural foramen could represent scarring. Follow-up images with contrast are recommended for evaluation of that possibility. IMPRESSION: 1. Soft disc protrusions into both neural foramina at L5-S1 which could affect either or both L5 nerves. Because the patient has had prior surgery at L5-S1 on the right, the abnormality in the right neural foramen could represent scarring. Follow-up MRI images with contrast recommended for further characterization. 2. 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 3. Sigmoid diverticulosis. Electronically Signed   By: Lorriane Shire M.D.   On: 09/17/2019 16:00   ECHOCARDIOGRAM COMPLETE  Result Date: 09/17/2019   ECHOCARDIOGRAM REPORT   Patient Name:   MAYCEN NIEMEIER Date of Exam: 09/17/2019 Medical Rec #:  [id number]     Height:       70.0 in Accession #:    [id number]    Weight:       200.8 lb Date of Birth:  10/30/1955     BSA:          2.09 m Patient Age:    40 years      BP:           128/74 mmHg Patient Gender: M             HR:           53 bpm. Exam Location:  Inpatient Procedure: 2D Echo Indications:    Stroke I163.9  History:        Patient has prior history of Echocardiogram examinations, most                 recent 12/31/2011. CAD; Risk Factors:Hypertension, Dyslipidemia                 and Current Smoker.  Sonographer:    Mikki Santee RDCS (AE) Referring Phys: [place]  1. Left  ventricular ejection fraction, by visual estimation, is 60 to 65%. The left ventricle has normal function. There is no left ventricular hypertrophy.  2. Left ventricular diastolic parameters are indeterminate.  3. The left ventricle has no regional wall motion abnormalities.  4. Global right ventricle has normal systolic function.The right ventricular size is normal. No increase in right ventricular wall thickness.  5. Left atrial size was normal.  6. Right atrial size was normal.  7. The mitral valve is normal in structure. No evidence of mitral valve regurgitation. No evidence of mitral stenosis.  8. The tricuspid valve is normal in structure.  9. The aortic valve is normal in structure. Aortic valve regurgitation is not visualized. No evidence of aortic valve sclerosis or stenosis. 10. The pulmonic valve was normal in structure. Pulmonic valve regurgitation is not visualized. 11. The inferior vena cava is normal in size with greater than 50% respiratory variability,  suggesting right atrial pressure of 3 mmHg. FINDINGS  Left Ventricle: Left ventricular ejection fraction, by visual estimation, is 60 to 65%. The left ventricle has normal function. The left ventricle has no regional wall motion abnormalities. There is no left ventricular hypertrophy. Left ventricular diastolic parameters are indeterminate. Indeterminate filling pressures. Right Ventricle: The right ventricular size is normal. No increase in right ventricular wall thickness. Global RV systolic function is has normal systolic function. Left Atrium: Left atrial size was normal in size. Right Atrium: Right atrial size was normal in size Pericardium: There is no evidence of pericardial effusion. Mitral Valve: The mitral valve is normal in structure. No evidence of mitral valve regurgitation. No evidence of mitral valve stenosis by observation. Tricuspid Valve: The tricuspid valve is normal in structure. Tricuspid valve regurgitation is not demonstrated.  Aortic Valve: The aortic valve is normal in structure. Aortic valve regurgitation is not visualized. The aortic valve is structurally normal, with no evidence of sclerosis or stenosis. Pulmonic Valve: The pulmonic valve was normal in structure. Pulmonic valve regurgitation is not visualized. Pulmonic regurgitation is not visualized. Aorta: The aortic root, ascending aorta and aortic arch are all structurally normal, with no evidence of dilitation or obstruction. Venous: The inferior vena cava is normal in size with greater than 50% respiratory variability, suggesting right atrial pressure of 3 mmHg. IAS/Shunts: No atrial level shunt detected by color flow Doppler. There is no evidence of a patent foramen ovale. No ventricular septal defect is seen or detected. There is no evidence of an atrial septal defect.  LEFT VENTRICLE PLAX 2D LVIDd:         4.80 cm  Diastology LVIDs:         2.90 cm  LV e' lateral:   6.09 cm/s LV PW:         1.00 cm  LV E/e' lateral: 11.6 LV IVS:        1.00 cm  LV e' medial:    7.40 cm/s LVOT diam:     2.30 cm  LV E/e' medial:  9.5 LV SV:         75 ml LV SV Index:   35.17 LVOT Area:     4.15 cm  RIGHT VENTRICLE RV S prime:     13.70 cm/s TAPSE (M-mode): 1.8 cm LEFT ATRIUM           Index       RIGHT ATRIUM           Index LA diam:      3.00 cm 1.43 cm/m  RA Area:     13.10 cm LA Vol (A2C): 43.0 ml 20.56 ml/m RA Volume:   29.90 ml  14.30 ml/m LA Vol (A4C): 39.0 ml 18.65 ml/m  AORTIC VALVE LVOT Vmax:   85.00 cm/s LVOT Vmean:  53.200 cm/s LVOT VTI:    0.215 m  AORTA Ao Root diam: 3.50 cm MITRAL VALVE MV Area (PHT): 3.37 cm             SHUNTS MV PHT:        65.25 msec           Systemic VTI:  0.22 m MV Decel Time: 225 msec             Systemic Diam: 2.30 cm MV E velocity: 70.60 cm/s 103 cm/s MV A velocity: 65.10 cm/s 70.3 cm/s MV E/A ratio:  1.08       1.5  Mihai Croitoru MD Electronically signed by  Dani Gobble Croitoru MD Signature Date/Time: 09/17/2019/11:16:02 AM    Final    CT HEAD CODE  STROKE WO CONTRAST`  Result Date: 09/16/2019 CLINICAL DATA:  Code stroke. Ataxia. Left-sided weakness. Stroke suspected. Last seen normal 2.5 hours ago. EXAM: CT HEAD WITHOUT CONTRAST TECHNIQUE: Contiguous axial images were obtained from the base of the skull through the vertex without intravenous contrast. COMPARISON:  CT head without contrast 12/30/2011. MR head without contrast 12/31/2011. FINDINGS: Brain: Remote lacunar infarct is noted along the bilateral ventricle. No acute infarct, hemorrhage, or mass lesion is present. Basal ganglia are intact. Insular ribbon is normal bilaterally. No acute or focal cortical abnormalities are present. The ventricles are of normal size. No significant extraaxial fluid collection is present. The brainstem and cerebellum are within normal limits. Vascular: Atherosclerotic changes are present within the cavernous internal carotid arteries bilaterally. Hyperdense right M1 segment is concerning for acute thrombus. Skull: Calvarium is intact. No focal lytic or blastic lesions are present. No significant extracranial soft tissue lesion is present. Sinuses/Orbits: There is scattered opacification of ethmoid air cells. Paranasal sinuses and mastoid air cells are otherwise clear. ASPECTS [facility] Stroke Program Early CT Score) - Ganglionic level infarction (caudate, lentiform nuclei, internal capsule, insula, M1-M3 cortex): 7/7 - Supraganglionic infarction (M4-M6 cortex): 3/3 Total score (0-10 with 10 being normal): 10/10 IMPRESSION: 1. Remote white matter infarct adjacent to the right lateral ventricle. 2. No acute cortical abnormality. 3. Question hyperdense right M1 segment suggesting thrombus. Recommend CTA of the head and neck for further evaluation. 4. ASPECTS is 10/10 The above was relayed via text pager to Dr. Cheral Marker on 09/16/2019 at 20:29 . Electronically Signed   By: San Morelle M.D.   On: 09/16/2019 20:29     Discharge Exam: Vitals:   09/17/19 0930  09/17/19 1152  BP: (!) 155/80 (!) 169/84  Pulse: (!) 59 (!) 55  Resp: 14 16  Temp: 97.8 F (36.6 C) (!) 97.5 F (36.4 C)  SpO2: 98% 96%   Vitals:   09/17/19 0408 09/17/19 0730 09/17/19 0930 09/17/19 1152  BP: 128/74 128/76 (!) 155/80 (!) 169/84  Pulse: 65 (!) 59 (!) 59 (!) 55  Resp: 19 16 14 16   Temp:  98.4 F (36.9 C) 97.8 F (36.6 C) (!) 97.5 F (36.4 C)  TempSrc:  Oral Oral Oral  SpO2: 98% 98% 98% 96%  Weight:      Height:        General: Pt is alert, awake, not in acute distress Cardiovascular: RRR, S1/S2 +, no rubs, no gallops Respiratory: CTA bilaterally, no wheezing, no rhonchi Abdominal: Soft, NT, ND, bowel sounds + Extremities: no edema, no cyanosis    The results of significant diagnostics from this hospitalization (including imaging, microbiology, ancillary and laboratory) are listed below for reference.     Microbiology: Recent Results (from the past 240 hour(s))  SARS CORONAVIRUS 2 (TAT 6-24 HRS) Nasopharyngeal Nasopharyngeal Swab     Status: None   Collection Time: 09/17/19 12:12 AM   Specimen: Nasopharyngeal Swab  Result Value Ref Range Status   SARS Coronavirus 2 NEGATIVE NEGATIVE Final    Comment: (NOTE) SARS-CoV-2 target nucleic acids are NOT DETECTED. The SARS-CoV-2 RNA is generally detectable in upper and lower respiratory specimens during the acute phase of infection. Negative results do not preclude SARS-CoV-2 infection, do not rule out co-infections with other pathogens, and should not be used as the sole basis for treatment or other patient management decisions. Negative results must be combined with clinical observations,  patient history, and epidemiological information. The expected result is Negative. Fact Sheet for Patients: SugarRoll.be Fact Sheet for Healthcare Providers: https://www.woods-mathews.com/ This test is not yet approved or cleared by the [place] FDA and  has been authorized  for detection and/or diagnosis of SARS-CoV-2 by FDA under an Emergency Use Authorization (EUA). This EUA will remain  in effect (meaning this test can be used) for the duration of the COVID-19 declaration under Section 56 4(b)(1) of the Act, 21 U.S.C. section 360bbb-3(b)(1), unless the authorization is terminated or revoked sooner. Performed at [facility], [place] [address], [place], [place] [address]      Labs: BNP (last 3 results) No results for input(s): BNP in the last 8760 hours. Basic Metabolic Panel: Recent Labs  Lab 09/16/19 2018 09/16/19 2025 09/16/19 2258 09/17/19 0712  NA 139 140  --  140  K 3.9 3.2*  --  3.7  CL 104 104  --  107  CO2 25  --   --  25  GLUCOSE 101* 97  --  92  BUN 11 11  --  12  CREATININE 1.09 1.20  --  1.10  CALCIUM 9.3  --   --  9.3  MG  --   --  2.0  --   PHOS  --   --  4.5  --    Liver Function Tests: Recent Labs  Lab 09/16/19 2018  AST 37  ALT 33  ALKPHOS 32*  BILITOT 1.0  PROT 6.9  ALBUMIN 4.4   No results for input(s): LIPASE, AMYLASE in the last 168 hours. No results for input(s): AMMONIA in the last 168 hours. CBC: Recent Labs  Lab 09/16/19 2018 09/16/19 2025  WBC 6.5  --   NEUTROABS 3.9  --   HGB 15.8 16.3  HCT 46.2 48.0  MCV 89.7  --   PLT 198  --    Cardiac Enzymes: No results for input(s): CKTOTAL, CKMB, CKMBINDEX, TROPONINI in the last 168 hours. BNP: Invalid input(s): POCBNP CBG: Recent Labs  Lab 09/16/19 2007  GLUCAP 93   D-Dimer No results for input(s): DDIMER in the last 72 hours. Hgb A1c Recent Labs    09/17/19 0712  HGBA1C 5.8*   Lipid Profile Recent Labs    09/17/19 0712  CHOL 201*  HDL 42  LDLCALC 117*  TRIG 209*  CHOLHDL 4.8   Thyroid function studies No results for input(s): TSH, T4TOTAL, T3FREE, THYROIDAB in the last 72 hours.  Invalid input(s): FREET3 Anemia work up No results for input(s): VITAMINB12, FOLATE, FERRITIN, TIBC, IRON, RETICCTPCT in the last 72  hours. Urinalysis    Component Value Date/Time   COLORURINE STRAW (A) 09/16/2019 2017   APPEARANCEUR CLEAR 09/16/2019 2017   LABSPEC 1.003 (L) 09/16/2019 2017   PHURINE 6.0 09/16/2019 2017   GLUCOSEU NEGATIVE 09/16/2019 2017   HGBUR NEGATIVE 09/16/2019 2017   BILIRUBINUR NEGATIVE 09/16/2019 2017   KETONESUR NEGATIVE 09/16/2019 2017   PROTEINUR NEGATIVE 09/16/2019 2017   UROBILINOGEN 0.2 06/12/2007 1531   NITRITE NEGATIVE 09/16/2019 2017   LEUKOCYTESUR NEGATIVE 09/16/2019 2017   Sepsis Labs Invalid input(s): PROCALCITONIN,  WBC,  LACTICIDVEN Microbiology Recent Results (from the past 240 hour(s))  SARS CORONAVIRUS 2 (TAT 6-24 HRS) Nasopharyngeal Nasopharyngeal Swab     Status: None   Collection Time: 09/17/19 12:12 AM   Specimen: Nasopharyngeal Swab  Result Value Ref Range Status   SARS Coronavirus 2 NEGATIVE NEGATIVE Final    Comment: (NOTE) SARS-CoV-2 target nucleic acids  are NOT DETECTED. The SARS-CoV-2 RNA is generally detectable in upper and lower respiratory specimens during the acute phase of infection. Negative results do not preclude SARS-CoV-2 infection, do not rule out co-infections with other pathogens, and should not be used as the sole basis for treatment or other patient management decisions. Negative results must be combined with clinical observations, patient history, and epidemiological information. The expected result is Negative. Fact Sheet for Patients: SugarRoll.be Fact Sheet for Healthcare Providers: https://www.woods-mathews.com/ This test is not yet approved or cleared by the [place] FDA and  has been authorized for detection and/or diagnosis of SARS-CoV-2 by FDA under an Emergency Use Authorization (EUA). This EUA will remain  in effect (meaning this test can be used) for the duration of the COVID-19 declaration under Section 56 4(b)(1) of the Act, 21 U.S.C. section 360bbb-3(b)(1), unless the  authorization is terminated or revoked sooner. Performed at [facility], [place] [address], [place], [place] [address]      Time coordinating discharge: Over 30 minutes  SIGNED:   Darliss Cheney, MD  [facility] 09/17/2019, 4:17 PM  If 7PM-7AM, please contact night-coverage [email or web address] Password [email or web address]

## 2019-09-17 NOTE — Plan of Care (Signed)
MRI L spine reviewed. Reported b/l L5-S1 foramen narrowing. He had prior surgery with Dr. Trenton Gammon at L5-S1 on the right, the right L5-S1 narrowing could be due to scarring. This is chronic issue and pt currently asymptomatic. Will request close follow up with neurosurgery Dr. Trenton Gammon as outpt. He will also need close follow up with orthopedic surgery for his right hip pain. Discussed with Dr. Doristine Bosworth and agree with discharge and close outpt follow up. Continue ASA and statin and quit smoking, limiting alcohol. Neurology will sign off and please call with questions.    Rosalin Hawking, MD PhD Stroke Neurology 09/17/2019 4:23 PM

## 2020-01-05 ENCOUNTER — Telehealth: Payer: Self-pay | Admitting: *Deleted

## 2020-01-05 NOTE — Telephone Encounter (Signed)
   [facility] Pre-operative Risk Assessment    Request for surgical clearance:  1. What type of surgery is being performed? L5-S1   2. When is this surgery scheduled? TBD   3. What type of clearance is required (medical clearance vs. Pharmacy clearance to hold med vs. Both)? MEDICAL  4. Are there any medications that need to be held prior to surgery and how long? NONE LISTED   5. Practice name and name of physician performing surgery? [facility]; DR. Mallie Mussel POOL   6. What is your office phone number [phone]    7.   What is your office fax number [phone] ATTN: VANESSA EXT [phone]  3.   Anesthesia type (None, local, MAC, general) ? GENERAL   Julaine Hua 01/05/2020, 10:23 AM  _________________________________________________________________   (provider comments below)

## 2020-01-05 NOTE — Telephone Encounter (Signed)
Hi Carl [name]   I called pt 2x today and left a message. Also, created referral to be schedule new pt   [name]

## 2020-01-05 NOTE — Telephone Encounter (Signed)
Message sent to scheduling to set up New Pt appt for pre op clearance.

## 2020-01-05 NOTE — Telephone Encounter (Signed)
   [place], MD  Chart reviewed as part of pre-operative protocol coverage. Patient has not been seen by our office since 2015. Therefore, he will require a New Patient visit in order to better assess preoperative cardiovascular risk.  Pre-op covering staff: - Please schedule appointment and call patient to inform them. - Please contact requesting surgeon's office via preferred method (i.e, phone, fax) to inform them of need for appointment prior to surgery.  If applicable, this message will also be routed to pharmacy pool and/or primary cardiologist for input on holding anticoagulant/antiplatelet agent as requested below so that this information is available at time of patient's appointment.   Darreld Mclean, PA-C  01/05/2020, 10:30 AM

## 2020-01-08 ENCOUNTER — Telehealth: Payer: Self-pay | Admitting: *Deleted

## 2020-01-08 NOTE — Telephone Encounter (Signed)
    I went in pt's chart to see what phone note said from 01-05-20

## 2020-01-08 NOTE — Telephone Encounter (Signed)
Pt has appt with Dr. Irish Lack 01/15/20 for pre op clearance. I will forward clearance notes to MD for upcoming appt. I will send FYI to surgeon Earnie Larsson pt has upcoming appt. I will remove from the pre op call back pool.
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
## 2020-01-15 ENCOUNTER — Telehealth: Payer: Self-pay | Admitting: Interventional Cardiology

## 2020-01-15 ENCOUNTER — Other Ambulatory Visit: Payer: Self-pay

## 2020-01-15 ENCOUNTER — Ambulatory Visit: Payer: Medicare HMO | Admitting: Interventional Cardiology

## 2020-01-15 ENCOUNTER — Encounter: Payer: Self-pay | Admitting: Interventional Cardiology

## 2020-01-15 VITALS — BP 180/98 | HR 68 | Ht 69.0 in | Wt 205.0 lb

## 2020-01-15 DIAGNOSIS — I10 Essential (primary) hypertension: Secondary | ICD-10-CM

## 2020-01-15 DIAGNOSIS — I201 Angina pectoris with documented spasm: Secondary | ICD-10-CM

## 2020-01-15 DIAGNOSIS — Z01810 Encounter for preprocedural cardiovascular examination: Secondary | ICD-10-CM | POA: Diagnosis not present

## 2020-01-15 DIAGNOSIS — I25118 Atherosclerotic heart disease of native coronary artery with other forms of angina pectoris: Secondary | ICD-10-CM | POA: Diagnosis not present

## 2020-01-15 DIAGNOSIS — Z720 Tobacco use: Secondary | ICD-10-CM

## 2020-01-15 MED ORDER — LISINOPRIL 10 MG PO TABS: 10.0000 mg | ORAL_TABLET | Freq: Every morning | ORAL | 3 refills | Status: DC

## 2020-01-15 MED ORDER — LISINOPRIL 20 MG PO TABS: 20.0000 mg | ORAL_TABLET | Freq: Every morning | ORAL | 3 refills | Status: DC

## 2020-01-15 NOTE — Telephone Encounter (Signed)
New Message    Pts wife is calling and is wondering if h can have the blood work done at his PCP's office. She says he has an appt with them on 05/06 and doesn't want to have to go out more times then he needs too     Please advise

## 2020-01-15 NOTE — Telephone Encounter (Signed)
Left message for patient to call back  

## 2020-01-15 NOTE — Telephone Encounter (Signed)
New Message     Pt c/o medication issue:  1. Name of Medication: Lisinopril   2. How are you currently taking this medication (dosage and times per day)? Pt was taking 5 mg and was told to increase to 20 mg   3. Are you having a reaction (difficulty breathing--STAT)? No   4. What is your medication issue? Pts wife says they had gotten a message from the pharmacy that states it is too early for him to get a refill on his medication Lisinopril 20 mg     Please call back

## 2020-01-15 NOTE — Patient Instructions (Addendum)
Medication Instructions:  Your physician has recommended you make the following change in your medication:   INCREASE: lisinopril to 20 mg once a day  *If you need a refill on your cardiac medications before your next appointment, please call your pharmacy*   Lab Work: Your physician recommends that you return for lab work (BMET) on 01/24/20   If you have labs (blood work) drawn today and your tests are completely normal, you will receive your results only by: Marland Kitchen MyChart Message (if you have MyChart) OR . A paper copy in the mail If you have any lab test that is abnormal or we need to change your treatment, we will call you to review the results.   Testing/Procedures: None ordered   Follow-Up: Follow up with Dr. Irish Lack on 04/16/20 at 1:20 PM   Other Instructions

## 2020-01-15 NOTE — Telephone Encounter (Signed)
Called and made wife aware that lab orders have been faxed to patient's PCP below.

## 2020-01-15 NOTE — Telephone Encounter (Signed)
Wife states that she has received the lisinopril and does not have any further questions or concerns.

## 2020-01-15 NOTE — Progress Notes (Signed)
Called [facility] in [place] and made her aware that the patient's new RX for lisinopril should be for 20 mg QD. Made her aware that we initially were going to increase his 5 mg to 10 mg but ultimately decided to increase it to 20 mg QD. She states that she has received the 20 mg Rx and will cancel the 10 mg Rx.

## 2020-01-15 NOTE — Telephone Encounter (Signed)
Lab orders faxed to PCP's office

## 2020-01-15 NOTE — Telephone Encounter (Signed)
Patient's wife states she is requesting to have an order for blood work faxed to patient's PCP, [place]. Appointment for BMET previously scheduled for 01/24/20 has been cancelled. Please fax order to [facility] at [phone].

## 2020-01-15 NOTE — Telephone Encounter (Signed)
Left message for patient to call back.  I called and spoke to pharmacist earlier regarding this RX and they confirmed that they received the new Rx for 20 mg QD.

## 2020-01-16 ENCOUNTER — Other Ambulatory Visit: Payer: Self-pay | Admitting: Neurosurgery

## 2020-01-16 ENCOUNTER — Telehealth: Payer: Self-pay | Admitting: Interventional Cardiology

## 2020-01-16 NOTE — Telephone Encounter (Signed)
Patients wife called - thought someone called her today but I did not see any notes.

## 2020-01-18 ENCOUNTER — Other Ambulatory Visit: Payer: Self-pay

## 2020-01-18 ENCOUNTER — Other Ambulatory Visit (HOSPITAL_COMMUNITY)
Admission: RE | Admit: 2020-01-18 | Discharge: 2020-01-18 | Disposition: A | Discharge status: Home / Self Care | Payer: Medicare HMO | Source: Ambulatory Visit | Attending: Neurosurgery | Admitting: Neurosurgery

## 2020-01-18 ENCOUNTER — Encounter (HOSPITAL_COMMUNITY): Payer: Self-pay | Admitting: Neurosurgery

## 2020-01-18 DIAGNOSIS — Z01812 Encounter for preprocedural laboratory examination: Secondary | ICD-10-CM | POA: Diagnosis present

## 2020-01-18 DIAGNOSIS — Z20822 Contact with and (suspected) exposure to covid-19: Secondary | ICD-10-CM | POA: Insufficient documentation

## 2020-01-18 LAB — SARS CORONAVIRUS 2 (TAT 6-24 HRS): SARS Coronavirus 2: NEGATIVE

## 2020-01-18 NOTE — Progress Notes (Addendum)
Mr Bossier denies chest pain or shortness of breath. Patient was tested for Covid and has been in quarantine since that time. Patient tested negative for Covid today and is in quarantine with his wife. Mr. Pund asked me to speak to his wife Claiborne Billings to complete call, I did , I heard Mrs. Riggan tell patient to not  Smoke within 24 hours of OR. Mrs Blackley said that patient usually does not take medications prior to surgery, "it might make him sick."  I instructed Mrs. Tapscott to have patient take Metoprolol at least.
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
## 2020-01-22 ENCOUNTER — Ambulatory Visit (HOSPITAL_COMMUNITY): Payer: Medicare HMO | Admitting: Vascular Surgery

## 2020-01-22 ENCOUNTER — Observation Stay (HOSPITAL_COMMUNITY)
Admission: RE | Admit: 2020-01-22 | Discharge: 2020-01-24 | Disposition: A | Discharge status: Home / Self Care | Payer: Medicare HMO | Attending: Neurosurgery | Admitting: Neurosurgery

## 2020-01-22 ENCOUNTER — Encounter (HOSPITAL_COMMUNITY): Payer: Self-pay | Admitting: Neurosurgery

## 2020-01-22 ENCOUNTER — Encounter (HOSPITAL_COMMUNITY)
Admission: RE | Disposition: A | Discharge status: Home / Self Care | Payer: Self-pay | Source: Home / Self Care | Attending: Neurosurgery

## 2020-01-22 ENCOUNTER — Other Ambulatory Visit: Payer: Self-pay

## 2020-01-22 ENCOUNTER — Ambulatory Visit (HOSPITAL_COMMUNITY): Payer: Medicare HMO

## 2020-01-22 DIAGNOSIS — Z79899 Other long term (current) drug therapy: Secondary | ICD-10-CM | POA: Diagnosis not present

## 2020-01-22 DIAGNOSIS — M4807 Spinal stenosis, lumbosacral region: Principal | ICD-10-CM | POA: Insufficient documentation

## 2020-01-22 DIAGNOSIS — K219 Gastro-esophageal reflux disease without esophagitis: Secondary | ICD-10-CM | POA: Diagnosis not present

## 2020-01-22 DIAGNOSIS — Z8673 Personal history of transient ischemic attack (TIA), and cerebral infarction without residual deficits: Secondary | ICD-10-CM | POA: Insufficient documentation

## 2020-01-22 DIAGNOSIS — M5136 Other intervertebral disc degeneration, lumbar region: Secondary | ICD-10-CM | POA: Insufficient documentation

## 2020-01-22 DIAGNOSIS — K5730 Diverticulosis of large intestine without perforation or abscess without bleeding: Secondary | ICD-10-CM | POA: Diagnosis not present

## 2020-01-22 DIAGNOSIS — E785 Hyperlipidemia, unspecified: Secondary | ICD-10-CM | POA: Diagnosis not present

## 2020-01-22 DIAGNOSIS — G709 Myoneural disorder, unspecified: Secondary | ICD-10-CM | POA: Diagnosis not present

## 2020-01-22 DIAGNOSIS — I700 Atherosclerosis of aorta: Secondary | ICD-10-CM | POA: Diagnosis not present

## 2020-01-22 DIAGNOSIS — Z6830 Body mass index (BMI) 30.0-30.9, adult: Secondary | ICD-10-CM | POA: Diagnosis not present

## 2020-01-22 DIAGNOSIS — Z791 Long term (current) use of non-steroidal anti-inflammatories (NSAID): Secondary | ICD-10-CM | POA: Insufficient documentation

## 2020-01-22 DIAGNOSIS — I10 Essential (primary) hypertension: Secondary | ICD-10-CM | POA: Diagnosis not present

## 2020-01-22 DIAGNOSIS — M5417 Radiculopathy, lumbosacral region: Secondary | ICD-10-CM | POA: Diagnosis not present

## 2020-01-22 DIAGNOSIS — M5137 Other intervertebral disc degeneration, lumbosacral region: Secondary | ICD-10-CM | POA: Insufficient documentation

## 2020-01-22 DIAGNOSIS — Z7982 Long term (current) use of aspirin: Secondary | ICD-10-CM | POA: Diagnosis not present

## 2020-01-22 DIAGNOSIS — I2510 Atherosclerotic heart disease of native coronary artery without angina pectoris: Secondary | ICD-10-CM | POA: Diagnosis not present

## 2020-01-22 DIAGNOSIS — E039 Hypothyroidism, unspecified: Secondary | ICD-10-CM | POA: Diagnosis not present

## 2020-01-22 DIAGNOSIS — M48061 Spinal stenosis, lumbar region without neurogenic claudication: Secondary | ICD-10-CM | POA: Diagnosis present

## 2020-01-22 DIAGNOSIS — M1990 Unspecified osteoarthritis, unspecified site: Secondary | ICD-10-CM | POA: Diagnosis not present

## 2020-01-22 DIAGNOSIS — Z8249 Family history of ischemic heart disease and other diseases of the circulatory system: Secondary | ICD-10-CM | POA: Insufficient documentation

## 2020-01-22 DIAGNOSIS — F17210 Nicotine dependence, cigarettes, uncomplicated: Secondary | ICD-10-CM | POA: Diagnosis not present

## 2020-01-22 DIAGNOSIS — Z419 Encounter for procedure for purposes other than remedying health state, unspecified: Secondary | ICD-10-CM

## 2020-01-22 HISTORY — DX: Gastro-esophageal reflux disease without esophagitis: K21.9

## 2020-01-22 HISTORY — DX: Pneumonia, unspecified organism: J18.9

## 2020-01-22 LAB — BASIC METABOLIC PANEL
Anion gap: 10 (ref 5–15)
BUN: 15 mg/dL (ref 8–23)
CO2: 21 mmol/L — ABNORMAL LOW (ref 22–32)
Calcium: 9.6 mg/dL (ref 8.9–10.3)
Chloride: 103 mmol/L (ref 98–111)
Creatinine, Ser: 1.21 mg/dL (ref 0.61–1.24)
GFR calc Af Amer: >60 mL/min (ref >60)
GFR calc non Af Amer: >60 mL/min (ref >60)
Glucose, Bld: 90 mg/dL (ref 70–99)
Potassium: 4 mmol/L (ref 3.5–5.1)
Sodium: 134 mmol/L — ABNORMAL LOW (ref 135–145)

## 2020-01-22 LAB — CBC WITH DIFFERENTIAL/PLATELET
Abs Immature Granulocytes: 0.03 10*3/uL (ref 0.00–0.07)
Basophils Absolute: 0.1 10*3/uL (ref 0.0–0.1)
Basophils Relative: 2 %
Eosinophils Absolute: 0.2 10*3/uL (ref 0.0–0.5)
Eosinophils Relative: 3 %
HCT: 48.1 % (ref 39.0–52.0)
Hemoglobin: 15.8 g/dL (ref 13.0–17.0)
Immature Granulocytes: 0 %
Lymphocytes Relative: 25 %
Lymphs Abs: 1.9 10*3/uL (ref 0.7–4.0)
MCH: 30.4 pg (ref 26.0–34.0)
MCHC: 32.8 g/dL (ref 30.0–36.0)
MCV: 92.5 fL (ref 80.0–100.0)
Monocytes Absolute: 0.5 10*3/uL (ref 0.1–1.0)
Monocytes Relative: 7 %
Neutro Abs: 4.9 10*3/uL (ref 1.7–7.7)
Neutrophils Relative %: 63 %
Platelets: 246 10*3/uL (ref 150–400)
RBC: 5.2 MIL/uL (ref 4.22–5.81)
RDW: 12.5 % (ref 11.5–15.5)
WBC: 7.7 10*3/uL (ref 4.0–10.5)
nRBC: 0 % (ref 0.0–0.2)

## 2020-01-22 LAB — TYPE AND SCREEN
ABO/RH(D): A POS
Antibody Screen: NEGATIVE

## 2020-01-22 LAB — ABO/RH: ABO/RH(D): A POS

## 2020-01-22 SURGERY — POSTERIOR LUMBAR FUSION 1 LEVEL
Anesthesia: General | Site: Back

## 2020-01-22 MED ORDER — PROPOFOL 10 MG/ML IV BOLUS
INTRAVENOUS | Status: DC | PRN
  Administered 2020-01-22: 180 mg via INTRAVENOUS

## 2020-01-22 MED ORDER — CEFAZOLIN SODIUM-DEXTROSE 1-4 GM/50ML-% IV SOLN
1.0000 g | Freq: Three times a day (TID) | INTRAVENOUS | Status: AC
  Administered 2020-01-22 – 2020-01-23 (×2): 1 g via INTRAVENOUS
  Filled 2020-01-22 (×2): qty 50

## 2020-01-22 MED ORDER — ONDANSETRON HCL 4 MG/2ML IJ SOLN
INTRAMUSCULAR | Status: DC | PRN
  Administered 2020-01-22: 4 mg via INTRAVENOUS

## 2020-01-22 MED ORDER — MIDAZOLAM HCL 5 MG/5ML IJ SOLN
INTRAMUSCULAR | Status: DC | PRN
  Administered 2020-01-22: 2 mg via INTRAVENOUS

## 2020-01-22 MED ORDER — ACETAMINOPHEN 325 MG PO TABS: 650.0000 mg | ORAL_TABLET | ORAL | Status: DC | PRN

## 2020-01-22 MED ORDER — PANTOPRAZOLE SODIUM 40 MG PO TBEC
80.0000 mg | DELAYED_RELEASE_TABLET | Freq: Every day | ORAL | Status: DC
  Administered 2020-01-22 – 2020-01-24 (×3): 80 mg via ORAL
  Filled 2020-01-22 (×3): qty 2

## 2020-01-22 MED ORDER — SUGAMMADEX SODIUM 200 MG/2ML IV SOLN
INTRAVENOUS | Status: DC | PRN
  Administered 2020-01-22: 200 mg via INTRAVENOUS

## 2020-01-22 MED ORDER — OXYCODONE HCL 5 MG PO TABS
ORAL_TABLET | ORAL | Status: AC
  Filled 2020-01-22: qty 1

## 2020-01-22 MED ORDER — FENTANYL CITRATE (PF) 100 MCG/2ML IJ SOLN
25.0000 ug | INTRAMUSCULAR | Status: DC | PRN
  Administered 2020-01-22 (×3): 50 ug via INTRAVENOUS

## 2020-01-22 MED ORDER — GLYCOPYRROLATE 0.2 MG/ML IJ SOLN
INTRAMUSCULAR | Status: DC | PRN
Start: 2020-01-22 — End: 2020-01-22
  Administered 2020-01-22: .2 mg via INTRAVENOUS

## 2020-01-22 MED ORDER — NITROGLYCERIN 0.4 MG SL SUBL: 0.4000 mg | SUBLINGUAL_TABLET | SUBLINGUAL | Status: DC | PRN

## 2020-01-22 MED ORDER — FENTANYL CITRATE (PF) 250 MCG/5ML IJ SOLN
INTRAMUSCULAR | Status: AC
  Filled 2020-01-22: qty 5

## 2020-01-22 MED ORDER — LIDOCAINE 2% (20 MG/ML) 5 ML SYRINGE
INTRAMUSCULAR | Status: DC | PRN
  Administered 2020-01-22: 60 mg via INTRAVENOUS

## 2020-01-22 MED ORDER — CEFAZOLIN SODIUM-DEXTROSE 2-4 GM/100ML-% IV SOLN
INTRAVENOUS | Status: AC
  Filled 2020-01-22: qty 100

## 2020-01-22 MED ORDER — MENTHOL 3 MG MT LOZG: 1.0000 | LOZENGE | OROMUCOSAL | Status: DC | PRN

## 2020-01-22 MED ORDER — SODIUM CHLORIDE 0.9 % IV SOLN: 250.0000 mL | INTRAVENOUS | Status: DC

## 2020-01-22 MED ORDER — ACETAMINOPHEN 500 MG PO TABS
ORAL_TABLET | ORAL | Status: AC
  Filled 2020-01-22: qty 2

## 2020-01-22 MED ORDER — FENTANYL CITRATE (PF) 100 MCG/2ML IJ SOLN
INTRAMUSCULAR | Status: AC
  Filled 2020-01-22: qty 2

## 2020-01-22 MED ORDER — ACETAMINOPHEN 10 MG/ML IV SOLN: 1000.0000 mg | Freq: Once | INTRAVENOUS | Status: DC | PRN

## 2020-01-22 MED ORDER — EPHEDRINE SULFATE 50 MG/ML IJ SOLN
INTRAMUSCULAR | Status: DC | PRN
  Administered 2020-01-22: 10 mg via INTRAVENOUS

## 2020-01-22 MED ORDER — FENOFIBRATE 160 MG PO TABS
160.0000 mg | ORAL_TABLET | Freq: Every day | ORAL | Status: DC
  Administered 2020-01-23 – 2020-01-24 (×2): 160 mg via ORAL
  Filled 2020-01-22 (×2): qty 1

## 2020-01-22 MED ORDER — DIAZEPAM 5 MG PO TABS
5.0000 mg | ORAL_TABLET | Freq: Four times a day (QID) | ORAL | Status: DC | PRN
  Administered 2020-01-22: 5 mg via ORAL
  Administered 2020-01-23 (×3): 10 mg via ORAL
  Administered 2020-01-23: 5 mg via ORAL
  Administered 2020-01-24: 10 mg via ORAL
  Filled 2020-01-22: qty 2
  Filled 2020-01-22: qty 1
  Filled 2020-01-22 (×3): qty 2
  Filled 2020-01-22: qty 1

## 2020-01-22 MED ORDER — CEFAZOLIN SODIUM-DEXTROSE 2-4 GM/100ML-% IV SOLN
2.0000 g | INTRAVENOUS | Status: AC
  Administered 2020-01-22: 2 g via INTRAVENOUS

## 2020-01-22 MED ORDER — ROCURONIUM BROMIDE 10 MG/ML (PF) SYRINGE
PREFILLED_SYRINGE | INTRAVENOUS | Status: AC
  Filled 2020-01-22: qty 10

## 2020-01-22 MED ORDER — FENTANYL CITRATE (PF) 250 MCG/5ML IJ SOLN
INTRAMUSCULAR | Status: DC | PRN
  Administered 2020-01-22 (×2): 100 ug via INTRAVENOUS
  Administered 2020-01-22 (×3): 50 ug via INTRAVENOUS

## 2020-01-22 MED ORDER — LEVOTHYROXINE SODIUM 112 MCG PO TABS
112.0000 ug | ORAL_TABLET | Freq: Every day | ORAL | Status: DC
  Administered 2020-01-23 – 2020-01-24 (×2): 112 ug via ORAL
  Filled 2020-01-22 (×2): qty 1

## 2020-01-22 MED ORDER — DEXAMETHASONE SODIUM PHOSPHATE 10 MG/ML IJ SOLN
10.0000 mg | Freq: Once | INTRAMUSCULAR | Status: AC
  Administered 2020-01-22: 10 mg via INTRAVENOUS

## 2020-01-22 MED ORDER — BUPIVACAINE HCL (PF) 0.25 % IJ SOLN
INTRAMUSCULAR | Status: DC | PRN
  Administered 2020-01-22: 20 mL

## 2020-01-22 MED ORDER — SUCCINYLCHOLINE CHLORIDE 20 MG/ML IJ SOLN
INTRAMUSCULAR | Status: DC | PRN
  Administered 2020-01-22: 120 mg via INTRAVENOUS

## 2020-01-22 MED ORDER — ACETAMINOPHEN 500 MG PO TABS
1000.0000 mg | ORAL_TABLET | Freq: Once | ORAL | Status: AC | PRN
  Administered 2020-01-22: 1000 mg via ORAL

## 2020-01-22 MED ORDER — PHENYLEPHRINE HCL-NACL 10-0.9 MG/250ML-% IV SOLN
INTRAVENOUS | Status: DC | PRN
  Administered 2020-01-22: 25 ug/min via INTRAVENOUS

## 2020-01-22 MED ORDER — SODIUM CHLORIDE 0.9% FLUSH: 3.0000 mL | INTRAVENOUS | Status: DC | PRN

## 2020-01-22 MED ORDER — POLYETHYLENE GLYCOL 3350 17 G PO PACK: 17.0000 g | PACK | Freq: Every day | ORAL | Status: DC | PRN

## 2020-01-22 MED ORDER — OXYCODONE HCL 5 MG PO TABS
10.0000 mg | ORAL_TABLET | ORAL | Status: DC | PRN
  Administered 2020-01-22 – 2020-01-24 (×11): 10 mg via ORAL
  Filled 2020-01-22 (×11): qty 2

## 2020-01-22 MED ORDER — DEXAMETHASONE SODIUM PHOSPHATE 10 MG/ML IJ SOLN
INTRAMUSCULAR | Status: AC
  Filled 2020-01-22: qty 1

## 2020-01-22 MED ORDER — BUPIVACAINE HCL (PF) 0.25 % IJ SOLN
INTRAMUSCULAR | Status: AC
  Filled 2020-01-22: qty 30

## 2020-01-22 MED ORDER — FLEET ENEMA 7-19 GM/118ML RE ENEM: 1.0000 | ENEMA | Freq: Once | RECTAL | Status: DC | PRN

## 2020-01-22 MED ORDER — LACTATED RINGERS IV SOLN: INTRAVENOUS | Status: DC

## 2020-01-22 MED ORDER — VANCOMYCIN HCL 1000 MG IV SOLR
INTRAVENOUS | Status: DC | PRN
  Administered 2020-01-22: 1000 mg via TOPICAL

## 2020-01-22 MED ORDER — THROMBIN 20000 UNITS EX SOLR
CUTANEOUS | Status: AC
  Filled 2020-01-22: qty 20000

## 2020-01-22 MED ORDER — HYDROCODONE-ACETAMINOPHEN 10-325 MG PO TABS
1.0000 | ORAL_TABLET | ORAL | Status: DC | PRN
  Administered 2020-01-23: 1 via ORAL
  Filled 2020-01-22: qty 1

## 2020-01-22 MED ORDER — PROPOFOL 10 MG/ML IV BOLUS
INTRAVENOUS | Status: AC
  Filled 2020-01-22: qty 20

## 2020-01-22 MED ORDER — ONDANSETRON HCL 4 MG/2ML IJ SOLN
INTRAMUSCULAR | Status: AC
  Filled 2020-01-22: qty 2

## 2020-01-22 MED ORDER — ROSUVASTATIN CALCIUM 20 MG PO TABS
40.0000 mg | ORAL_TABLET | Freq: Every morning | ORAL | Status: DC
  Administered 2020-01-23 – 2020-01-24 (×2): 40 mg via ORAL
  Filled 2020-01-22 (×2): qty 2

## 2020-01-22 MED ORDER — PHENOL 1.4 % MT LIQD: 1.0000 | OROMUCOSAL | Status: DC | PRN

## 2020-01-22 MED ORDER — ROCURONIUM BROMIDE 10 MG/ML (PF) SYRINGE
PREFILLED_SYRINGE | INTRAVENOUS | Status: DC | PRN
  Administered 2020-01-22: 40 mg via INTRAVENOUS
  Administered 2020-01-22 (×2): 20 mg via INTRAVENOUS
  Administered 2020-01-22: 60 mg via INTRAVENOUS

## 2020-01-22 MED ORDER — CHLORHEXIDINE GLUCONATE CLOTH 2 % EX PADS: 6.0000 | MEDICATED_PAD | Freq: Once | CUTANEOUS | Status: DC

## 2020-01-22 MED ORDER — ASPIRIN EC 81 MG PO TBEC
81.0000 mg | DELAYED_RELEASE_TABLET | Freq: Every day | ORAL | Status: DC
  Administered 2020-01-23 – 2020-01-24 (×2): 81 mg via ORAL
  Filled 2020-01-22 (×2): qty 1

## 2020-01-22 MED ORDER — ONDANSETRON HCL 4 MG PO TABS: 4.0000 mg | ORAL_TABLET | Freq: Four times a day (QID) | ORAL | Status: DC | PRN

## 2020-01-22 MED ORDER — ACETAMINOPHEN 160 MG/5ML PO SOLN: 1000.0000 mg | Freq: Once | ORAL | Status: AC | PRN

## 2020-01-22 MED ORDER — MIDAZOLAM HCL 2 MG/2ML IJ SOLN
INTRAMUSCULAR | Status: AC
  Filled 2020-01-22: qty 2

## 2020-01-22 MED ORDER — OXYCODONE HCL 5 MG PO TABS
5.0000 mg | ORAL_TABLET | Freq: Once | ORAL | Status: AC | PRN
  Administered 2020-01-22: 5 mg via ORAL

## 2020-01-22 MED ORDER — ONDANSETRON HCL 4 MG/2ML IJ SOLN: 4.0000 mg | Freq: Four times a day (QID) | INTRAMUSCULAR | Status: DC | PRN

## 2020-01-22 MED ORDER — SODIUM CHLORIDE 0.9 % IV SOLN
INTRAVENOUS | Status: DC | PRN
  Administered 2020-01-22: 15:00:00 500 mL

## 2020-01-22 MED ORDER — NITROGLYCERIN 0.3 MG/HR TD PT24
0.3000 mg | MEDICATED_PATCH | TRANSDERMAL | Status: DC | PRN
  Filled 2020-01-22: qty 1

## 2020-01-22 MED ORDER — BISACODYL 10 MG RE SUPP: 10.0000 mg | Freq: Every day | RECTAL | Status: DC | PRN

## 2020-01-22 MED ORDER — 0.9 % SODIUM CHLORIDE (POUR BTL) OPTIME
TOPICAL | Status: DC | PRN
  Administered 2020-01-22: 1000 mL

## 2020-01-22 MED ORDER — VANCOMYCIN HCL 1000 MG IV SOLR
INTRAVENOUS | Status: AC
  Filled 2020-01-22: qty 1000

## 2020-01-22 MED ORDER — THROMBIN 20000 UNITS EX SOLR
CUTANEOUS | Status: DC | PRN
  Administered 2020-01-22: 15:00:00 20 mL via TOPICAL

## 2020-01-22 MED ORDER — LISINOPRIL 20 MG PO TABS
20.0000 mg | ORAL_TABLET | Freq: Every day | ORAL | Status: DC
  Administered 2020-01-23 – 2020-01-24 (×2): 20 mg via ORAL
  Filled 2020-01-22 (×2): qty 1

## 2020-01-22 MED ORDER — ACETAMINOPHEN 650 MG RE SUPP: 650.0000 mg | RECTAL | Status: DC | PRN

## 2020-01-22 MED ORDER — HYDROMORPHONE HCL 1 MG/ML IJ SOLN
1.0000 mg | INTRAMUSCULAR | Status: DC | PRN
  Administered 2020-01-22: 1 mg via INTRAVENOUS
  Filled 2020-01-22: qty 1

## 2020-01-22 MED ORDER — OXYCODONE HCL 5 MG/5ML PO SOLN: 5.0000 mg | Freq: Once | ORAL | Status: AC | PRN

## 2020-01-22 MED ORDER — SODIUM CHLORIDE 0.9% FLUSH
3.0000 mL | Freq: Two times a day (BID) | INTRAVENOUS | Status: DC
  Administered 2020-01-22: 3 mL via INTRAVENOUS

## 2020-01-22 SURGICAL SUPPLY — 66 items
ADH SKN CLS APL DERMABOND .7 (GAUZE/BANDAGES/DRESSINGS) ×1
APL SKNCLS STERI-STRIP NONHPOA (GAUZE/BANDAGES/DRESSINGS) ×1
BAG DECANTER FOR FLEXI CONT (MISCELLANEOUS) ×3 IMPLANT
BENZOIN TINCTURE PRP APPL 2/3 (GAUZE/BANDAGES/DRESSINGS) ×3 IMPLANT
BLADE CLIPPER SURG (BLADE) ×2 IMPLANT
BUR CUTTER 7.0 ROUND (BURR) IMPLANT
BUR MATCHSTICK NEURO 3.0 LAGG (BURR) ×3 IMPLANT
CANISTER SUCT 3000ML PPV (MISCELLANEOUS) ×3 IMPLANT
CAP LCK SPNE (Orthopedic Implant) ×4 IMPLANT
CAP LOCK SPINE RADIUS (Orthopedic Implant) IMPLANT
CAP LOCKING (Orthopedic Implant) ×12 IMPLANT
CARTRIDGE OIL MAESTRO DRILL (MISCELLANEOUS) ×1 IMPLANT
CLOSURE WOUND 1/2 X4 (GAUZE/BANDAGES/DRESSINGS) ×1
CNTNR URN SCR LID CUP LEK RST (MISCELLANEOUS) ×1 IMPLANT
CONT SPEC 4OZ STRL OR WHT (MISCELLANEOUS) ×3
COVER BACK TABLE 60X90IN (DRAPES) ×3 IMPLANT
COVER WAND RF STERILE (DRAPES) ×1 IMPLANT
DECANTER SPIKE VIAL GLASS SM (MISCELLANEOUS) ×3 IMPLANT
DERMABOND ADVANCED (GAUZE/BANDAGES/DRESSINGS) ×2
DERMABOND ADVANCED .7 DNX12 (GAUZE/BANDAGES/DRESSINGS) ×1 IMPLANT
DEVICE INTERBODY ELEVATE 23X10 (Cage) ×4 IMPLANT
DIFFUSER DRILL AIR PNEUMATIC (MISCELLANEOUS) ×3 IMPLANT
DRAPE C-ARM 42X72 X-RAY (DRAPES) ×6 IMPLANT
DRAPE HALF SHEET 40X57 (DRAPES) IMPLANT
DRAPE LAPAROTOMY 100X72X124 (DRAPES) ×3 IMPLANT
DRAPE SURG 17X23 STRL (DRAPES) ×12 IMPLANT
DRSG OPSITE POSTOP 4X6 (GAUZE/BANDAGES/DRESSINGS) ×3 IMPLANT
DURAPREP 26ML APPLICATOR (WOUND CARE) ×3 IMPLANT
ELECT REM PT RETURN 9FT ADLT (ELECTROSURGICAL) ×3
ELECTRODE REM PT RTRN 9FT ADLT (ELECTROSURGICAL) ×1 IMPLANT
EVACUATOR 1/8 PVC DRAIN (DRAIN) IMPLANT
GAUZE 4X4 16PLY RFD (DISPOSABLE) IMPLANT
GAUZE SPONGE 4X4 12PLY STRL (GAUZE/BANDAGES/DRESSINGS) IMPLANT
GLOVE BIO SURGEON STRL SZ 6.5 (GLOVE) ×2 IMPLANT
GLOVE BIO SURGEONS STRL SZ 6.5 (GLOVE) ×1
GLOVE BIOGEL PI IND STRL 6.5 (GLOVE) ×1 IMPLANT
GLOVE BIOGEL PI INDICATOR 6.5 (GLOVE) ×2
GLOVE ECLIPSE 9.0 STRL (GLOVE) ×6 IMPLANT
GLOVE EXAM NITRILE XL STR (GLOVE) IMPLANT
GOWN STRL REUS W/ TWL LRG LVL3 (GOWN DISPOSABLE) IMPLANT
GOWN STRL REUS W/ TWL XL LVL3 (GOWN DISPOSABLE) ×2 IMPLANT
GOWN STRL REUS W/TWL 2XL LVL3 (GOWN DISPOSABLE) IMPLANT
GOWN STRL REUS W/TWL LRG LVL3 (GOWN DISPOSABLE)
GOWN STRL REUS W/TWL XL LVL3 (GOWN DISPOSABLE) ×6
GRAFT BN 5X1XSPNE CVD POST DBM (Bone Implant) IMPLANT
GRAFT BONE MAGNIFUSE 1X5CM (Bone Implant) ×3 IMPLANT
KIT BASIN OR (CUSTOM PROCEDURE TRAY) ×3 IMPLANT
KIT TURNOVER KIT B (KITS) ×3 IMPLANT
MILL MEDIUM DISP (BLADE) ×3 IMPLANT
NEEDLE HYPO 22GX1.5 SAFETY (NEEDLE) ×3 IMPLANT
NS IRRIG 1000ML POUR BTL (IV SOLUTION) ×3 IMPLANT
OIL CARTRIDGE MAESTRO DRILL (MISCELLANEOUS) ×3
PACK LAMINECTOMY NEURO (CUSTOM PROCEDURE TRAY) ×3 IMPLANT
ROD RADIUS 40MM (Neuro Prosthesis/Implant) ×6 IMPLANT
ROD SPNL 40X5.5XNS TI RDS (Neuro Prosthesis/Implant) IMPLANT
SCREW 5.75X45MM (Screw) ×8 IMPLANT
SPONGE SURGIFOAM ABS GEL 100 (HEMOSTASIS) ×3 IMPLANT
STRIP CLOSURE SKIN 1/2X4 (GAUZE/BANDAGES/DRESSINGS) ×3 IMPLANT
SUT VIC AB 0 CT1 18XCR BRD8 (SUTURE) ×2 IMPLANT
SUT VIC AB 0 CT1 8-18 (SUTURE) ×6
SUT VIC AB 2-0 CT1 18 (SUTURE) ×3 IMPLANT
SUT VIC AB 3-0 SH 8-18 (SUTURE) ×6 IMPLANT
TOWEL GREEN STERILE (TOWEL DISPOSABLE) ×3 IMPLANT
TOWEL GREEN STERILE FF (TOWEL DISPOSABLE) ×3 IMPLANT
TRAY FOLEY MTR SLVR 16FR STAT (SET/KITS/TRAYS/PACK) ×3 IMPLANT
WATER STERILE IRR 1000ML POUR (IV SOLUTION) ×3 IMPLANT

## 2020-01-22 NOTE — Anesthesia Procedure Notes (Signed)
Procedure Name: Intubation Date/Time: 01/22/2020 3:50 PM Performed by: Mariea Clonts, CRNA Pre-anesthesia Checklist: Patient identified, Emergency Drugs available, Suction available and Patient being monitored Patient Re-evaluated:Patient Re-evaluated prior to induction Oxygen Delivery Method: Circle System Utilized Preoxygenation: Pre-oxygenation with 100% oxygen Induction Type: IV induction Ventilation: Mask ventilation without difficulty Laryngoscope Size: Miller and 2 Grade View: Grade I Tube type: Oral Tube size: 7.5 mm Number of attempts: 1 Airway Equipment and Method: Stylet and Oral airway Placement Confirmation: ETT inserted through vocal cords under direct vision,  positive ETCO2 and breath sounds checked- equal and bilateral Tube secured with: Tape Dental Injury: Teeth and Oropharynx as per pre-operative assessment

## 2020-01-22 NOTE — Transfer of Care (Signed)
Immediate Anesthesia Transfer of Care Note  Patient: Carl [name]  Procedure(s) Performed: Posterior Lumbar Interbody Fusion - Lumbar Five-Sacral One ([name] Back)  Patient Location: PACU  Anesthesia Type:General  Level of Consciousness: awake, alert  and oriented  Airway & Oxygen Therapy: Patient Spontanous Breathing and Patient connected to face mask oxygen  Post-op Assessment: Report given to RN and Post -op Vital signs reviewed and stable  Post vital signs: Reviewed and stable  Last Vitals:  Vitals Value Taken Time  BP 140/71 01/22/20 1802  Temp    Pulse 76 01/22/20 1803  Resp 20 01/22/20 1803  SpO2 98 % 01/22/20 1803  Vitals shown include unvalidated device data.  Last Pain:  Vitals:   01/22/20 1242  TempSrc: Oral  PainSc: 6       Patients Stated Pain Goal: 2 ([id number])  Complications: No apparent anesthesia complications

## 2020-01-22 NOTE — Progress Notes (Signed)
Patient noted to have swollen knot around upper portion of surgical incision on his back.  No neurological assessment changes noted.  Dr. Annette Stable was made aware and he will assess this in the AM.
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
## 2020-01-22 NOTE — Brief Op Note (Signed)
01/22/2020  5:52 PM  PATIENT:  Carl [name]  64 y.o. male  PRE-OPERATIVE DIAGNOSIS:  Stenosis  POST-OPERATIVE DIAGNOSIS:  Stenosis  PROCEDURE:  Procedure(s) with comments: Posterior Lumbar Interbody Fusion - Lumbar Five-Sacral One ([name]) - Posterior Lumbar Interbody Fusion - Lumbar Five-Sacral One  SURGEON:  Surgeon(s) and Role:    * Earnie Larsson, MD - Primary  PHYSICIAN ASSISTANT:   ASSISTANTSMearl Latin   ANESTHESIA:   general  EBL:  250 mL   BLOOD ADMINISTERED:none  DRAINS: none   LOCAL MEDICATIONS USED:  MARCAINE     SPECIMEN:  No Specimen  DISPOSITION OF SPECIMEN:  [name]  COUNTS:  YES  TOURNIQUET:  * No tourniquets in log *  DICTATION: .Dragon Dictation  PLAN OF CARE: Admit for overnight observation  PATIENT DISPOSITION:  PACU - hemodynamically stable.   Delay start of Pharmacological VTE agent (>24hrs) due to surgical blood loss or risk of bleeding: yes

## 2020-01-22 NOTE — Op Note (Signed)
Date of procedure: 01/22/2020  Date of dictation: Same  Service: Neurosurgery  Preoperative diagnosis: L5-S1 foraminal stenosis with radiculopathy  Postoperative diagnosis: Same  Procedure Name: Bilateral L5-S1 decompressive laminotomy and foraminotomies, redo, more than would be required for simple interbody fusion alone.  L5-S1 posterior lumbar interbody fusion utilizing interbody cages and locally harvested autograft  L5-S1 posterior lateral arthrodesis utilizing nonsegmental pedicle screw fixation and local autograft  Surgeon:Aalaya Yadao A.Larene Ascencio, M.D.  Asst. Surgeon: Reinaldo Meeker, NP  Anesthesia: General  Indication: 64 year old male status post prior right-sided L5-S1 decompressive laminotomy and foraminotomy presents with severe recurrent right lower extremity radicular pain consistent with right-sided L5 radiculopathy.  Patient's follow-up MRI scan demonstrates severe collapse of his L5-S1 foramen on the right side with marked compression was right L5 nerve root.  The patient is failed conservative management.  He presents now for lumbar decompression and fusion in hopes of improving his symptoms.  Operative note: After induction anesthesia, patient positioned prone on the Wilson frame and appropriate padded.  Lumbar region prepped and draped sterilely.  Incision made from L4-S1.  Dissection performed bilaterally.  Retractor placed.  Fluoroscopy used.  Levels confirmed.  Previous laminotomy site on the right at L5-S1 was dissected free.  Decompressive laminotomies and foraminotomies then performed using [place] rongeurs care centers and high-speed drill to remove the inferior two thirds of the lamina of L5 the entire inferior facet and pars interarticularis of L5 the majority of the superior facet of S1 and the superior rim of the S1 lamina.  Ligament flavum and L pleural scar were elevated and resected.  Underlying thecal sac and exiting L5 and S1 nerve roots were identified bilaterally.  Bilateral  discectomies then performed.  Dissipates then cleaned of all soft tissue and prepared for interbody fusion.  With the patient's right-sided to space distracted the disc space was further curetted and a 9 mm extra lordotic [employer] expandable cage packed with locally harvested autograft was then impacted in the place and expanded to its full extent.  Distractor removed patient's right side.  The space prepared on the right side.  Morselized autograft packed in the interspace.  Second cage packed with autograft was then impacted into place and expanded to its full extent.  Pedicles at L5 and S1 were identified using surface landmarks and intraoperative fluoroscopy.  Superficial bone overlying the pedicle was then removed and high-speed drill patient pedicles then probed using a pedicle all each pedicle tract was then probed and found to be solidly within the bone.  Each pedicle tract was then tapped and then probed again.  5.75 mm radius brand screws from [employer] were placed bilaterally at L5 and S1.  Final images reveal good position of cages and the hardware at the proper upper level with normal alignment of the spine.  Wound is then irrigated one final time.  Gelfoam was placed topically for hemostasis.  Short segment titanium rod placed through the screw heads at L5 and S1.  L5 transverse processes and sacral ala were decorticated.  Morselized autograft was packed posterior laterally.  Locking caps then placed over the screw heads.  Locking caps then engaged with a construct under compression.  Vancomycin powder was placed in deep wound space.  Wounds and closed in layers with Vicryl sutures.  Steri-Strips and sterile dressing were applied.  No apparent complications.  Patient tolerated the procedure well and he returns to the recovery room postop.

## 2020-01-23 DIAGNOSIS — M4807 Spinal stenosis, lumbosacral region: Secondary | ICD-10-CM | POA: Diagnosis not present

## 2020-01-23 MED ORDER — ALUM & MAG HYDROXIDE-SIMETH 200-200-20 MG/5ML PO SUSP
30.0000 mL | Freq: Four times a day (QID) | ORAL | Status: DC | PRN
  Administered 2020-01-23: 30 mL via ORAL
  Filled 2020-01-23: qty 30

## 2020-01-23 NOTE — Progress Notes (Signed)
Occupational Therapy Treatment Patient Details Name: Carl [name] MRN: [id number] DOB: July 24, 1956 Today's Date: 01/23/2020    History of present illness Pt is a 64 yo male s/p Bilateral L5-S1 decompressive laminotomy and foraminotomies, redo, L5-S1 PLIF. PMHx: CVA, HTN, current smoker, arthritis.   OT comments  Pt progressing as pain has decreased. Pt seen 2nd time today to go over LB dressing due to increased pain earlier and limited ability to truly focus. Pt set-upA to minA overall for ADL. Pt provided education for LB dressing/LB ADL. Pt performing LB dressing with reacher and sock aid introduced. Pt plans to avoid wearing socks and pt reports "my spouse can assist me." Pt stating 3/3 back precautions. Pt would benefit from continued OT skilled services. OT following acutely.   Follow Up Recommendations  No OT follow up    Equipment Recommendations  None recommended by OT    Recommendations for Other Services      Precautions / Restrictions Precautions Precautions: Back Precaution Booklet Issued: Yes (comment) Precaution Comments: Reviewed back precautions and handout Required Braces or Orthoses: Spinal Brace Spinal Brace: Applied in standing position;Lumbar corset Restrictions Weight Bearing Restrictions: No       Mobility Bed Mobility Overal bed mobility: Needs Assistance Bed Mobility: Rolling;Sidelying to Sit;Sit to Sidelying Rolling: Supervision Sidelying to sit: Supervision     Sit to sidelying: Supervision General bed mobility comments: Pt performing log roll without education required; use of rail.  Transfers Overall transfer level: Needs assistance Equipment used: Rolling walker (2 wheeled) Transfers: Sit to/from Stand Sit to Stand: Supervision         General transfer comment: safety    Balance Overall balance assessment: Mild deficits observed, not formally tested                                         ADL either performed or  assessed with clinical judgement   ADL Overall ADL's : Needs assistance/impaired Eating/Feeding: Modified independent;Sitting   Grooming: Min guard;Standing;Modified independent;Sitting   Upper Body Bathing: Min guard;Standing   Lower Body Bathing: Minimal assistance Lower Body Bathing Details (indicate cue type and reason): education provided for LB AE Upper Body Dressing : Min guard;Standing   Lower Body Dressing: Min guard;Sitting/lateral leans;Sit to/from stand Lower Body Dressing Details (indicate cue type and reason): education provided for LB AE; pt simulating due to pt only had blue jeans in closet. Toilet Transfer: Min guard;Ambulation;RW;BSC   Toileting- Clothing Manipulation and Hygiene: Minimal assistance;Cueing for safety;Sitting/lateral lean;Sit to/from stand Toileting - Clothing Manipulation Details (indicate cue type and reason): pt with internal rotation and shoulder extension for task     Functional mobility during ADLs: Min guard;Rolling walker;Cueing for safety General ADL Comments: Thorough AE education. Pt reports to have reacher at home.Pt set-upA for LB ADL with AE provided.     Vision Baseline Vision/History: No visual deficits Patient Visual Report: No change from baseline Vision Assessment?: No apparent visual deficits   Perception     Praxis      Cognition Arousal/Alertness: Awake/alert Behavior During Therapy: WFL for tasks assessed/performed Overall Cognitive Status: Within Functional Limits for tasks assessed                                          Exercises  Shoulder Instructions       General Comments Pt feeling less painful in low back since meds have switched. Pt encouraged to rest for <1 hour as pt "was already up to walk the halls 5x today."     Pertinent Vitals/ Pain       Pain Assessment: 0-10 Pain Score: 4  Pain Location: back Pain Descriptors / Indicators: Operative site guarding;Sharp Pain  Intervention(s): Monitored during session;Limited activity within patient's tolerance  Home Living Family/patient expects to be discharged to:: Private residence Living Arrangements: Spouse/significant other;Children Available Help at Discharge: Family;Available 24 hours/day Type of Home: House Home Access: Stairs to enter [employer] of Steps: 2 Entrance Stairs-Rails: Left Home Layout: One level     Bathroom Shower/Tub: Teacher, early years/pre: Standard Bathroom Accessibility: Yes   Home Equipment: Cane - single point;Shower seat;Bedside commode;Adaptive equipment Adaptive Equipment: Reacher        Prior Functioning/Environment Level of Independence: Independent        Comments: Loves to play pool. Sells wood.   Frequency  Min 2X/week        Progress Toward Goals  OT Goals(current goals can now be found in the care plan section)     Acute Rehab OT Goals Patient Stated Goal: to get back to playing pool OT Goal Formulation: With patient Time For Goal Achievement: 02/06/20 Potential to Achieve Goals: Good ADL Goals Pt Will Perform Lower Body Dressing: with set-up;sitting/lateral leans;sit to/from stand;with adaptive equipment  Plan      Co-evaluation                 AM-PAC OT "6 Clicks" Daily Activity     Outcome Measure   Help from another person eating meals?: None Help from another person taking care of personal grooming?: A Little Help from another person toileting, which includes using toliet, bedpan, or urinal?: A Little Help from another person bathing (including washing, rinsing, drying)?: A Little Help from another person to put on and taking off regular upper body clothing?: None Help from another person to put on and taking off regular lower body clothing?: A Little 6 Click Score: 20    End of Session Equipment Utilized During Treatment: Rolling walker;Back brace  OT Visit Diagnosis: Muscle weakness (generalized)  (M62.81);Pain Pain - part of body: (back)   Activity Tolerance Patient limited by pain   Patient Left in bed;with call bell/phone within reach   Nurse Communication Mobility status;Patient requests pain meds        Time: 1335-1400 OT Time Calculation (min): 25 min  Charges: OT General Charges $OT Visit: 1 Visit  OT Treatments $Self Care/Home Management : 23-37 mins  Jefferey Pica, OTR/L Acute Rehabilitation Services Pager: [phone] Office: [phone]    Jamauri Kruzel C 01/23/2020, 2:58 PM

## 2020-01-23 NOTE — Evaluation (Signed)
Physical Therapy Evaluation Patient Details Name: Carl [name] MRN: [id number] DOB: Aug 29, 1956 Today's Date: 01/23/2020   History of Present Illness  Pt is a 64 yo male s/p Bilateral L5-S1 decompressive laminotomy and foraminotomies, redo, L5-S1 PLIF. PMHx: CVA, HTN, current smoker, arthritis.  Clinical Impression  Patient presents with pain and post surgical deficits s/p above surgery. Pt independent and lives with wife and handicapped son. Pt loves to play pool and cut wood. Today, pt tolerated transfers, gait training and stair training with supervision for safety. Biggest complaint is pain at surgical site. Pt visibly shaking and diaphoretic due to pain. RN aware. Education re: back precautions, positioning, log roll technique, walking program, stairs, brace etc. Will follow acutely to maximize independence and mobility prior to return home. Would likely benefit from another day for better pain control.    Follow Up Recommendations No PT follow up;Supervision - Intermittent    Equipment Recommendations  Rolling walker with 5" wheels    Recommendations for Other Services       Precautions / Restrictions Precautions Precautions: Back Precaution Booklet Issued: Yes (comment) Precaution Comments: Reviewed back precautions and handout Required Braces or Orthoses: Spinal Brace Spinal Brace: Applied in standing position;Lumbar corset Restrictions Weight Bearing Restrictions: No      Mobility  Bed Mobility               General bed mobility comments: Sitting EOB upon PT arrival. Verbalized log roll technique.  Transfers Overall transfer level: Needs assistance Equipment used: Rolling walker (2 wheeled) Transfers: Sit to/from Stand Sit to Stand: Supervision         General transfer comment: Supervision for safety. Stood from [employer].  Ambulation/Gait Ambulation/Gait assistance: Supervision Gait Distance (Feet): 400 Feet Assistive device: Rolling walker (2  wheeled) Gait Pattern/deviations: Step-through pattern;Decreased stride length   Gait velocity interpretation: 1.31 - 2.62 ft/sec, indicative of limited community ambulator General Gait Details: Slow, steady gait with use of RW. A few standing rest breaks due to pain. Pt visibly shaking and diaphoretic from pain.  Stairs Stairs: Yes Stairs assistance: Supervision Stair Management: Step to pattern;Alternating pattern Number of Stairs: 3 General stair comments: Cues for technique and safety.  Wheelchair Mobility    Modified Rankin (Stroke Patients Only)       Balance Overall balance assessment: Mild deficits observed, not formally tested                                           Pertinent Vitals/Pain Pain Assessment: 0-10 Pain Score: (12) Pain Location: back Pain Descriptors / Indicators: Operative site guarding;Sharp Pain Intervention(s): Repositioned;Monitored during session    [place] expects to be discharged to:: Private residence Living Arrangements: Spouse/significant other;Children Available Help at Discharge: Family;Available 24 hours/day Type of Home: House Home Access: Stairs to enter Entrance Stairs-Rails: Left Entrance Stairs-Number of Steps: 2 Home Layout: Laundry or work area in basement(has a man cave upstairs where [employer] pool andhas a big screen) Home Equipment: Kasandra Knudsen - single point      Prior Function Level of Independence: Independent         Comments: Loves to play pool. Sells wood.     Hand Dominance   Dominant Hand: Right    Extremity/Trunk Assessment   Upper Extremity Assessment Upper Extremity Assessment: Defer to OT evaluation    Lower Extremity Assessment Lower Extremity Assessment: Overall WFL for  tasks assessed    Cervical / Trunk Assessment Cervical / Trunk Assessment: Other exceptions Cervical / Trunk Exceptions: s/p spine surgery  Communication   Communication: No difficulties   Cognition Arousal/Alertness: Awake/alert Behavior During Therapy: WFL for tasks assessed/performed Overall Cognitive Status: Within Functional Limits for tasks assessed                                        General Comments General comments (skin integrity, edema, etc.): Able to donn brace in standing wtih setup.    Exercises     Assessment/Plan    PT Assessment Patient needs continued PT services  PT Problem List Pain;Decreased knowledge of precautions       PT Treatment Interventions Therapeutic activities;Gait training;Therapeutic exercise;Patient/family education;Stair training;Functional mobility training    PT Goals (Current goals can be found in the Care Plan section)  Acute Rehab PT Goals Patient Stated Goal: to get back to playing pool PT Goal Formulation: With patient Time For Goal Achievement: 02/06/20 Potential to Achieve Goals: Good    Frequency Min 5X/week   Barriers to discharge        Co-evaluation               AM-PAC PT "6 Clicks" Mobility  Outcome Measure Help needed turning from your back to your side while in a flat bed without using bedrails?: None Help needed moving from lying on your back to sitting on the side of a flat bed without using bedrails?: None Help needed moving to and from a bed to a chair (including a wheelchair)?: None Help needed standing up from a chair using your arms (e.g., wheelchair or bedside chair)?: None Help needed to walk in hospital room?: None Help needed climbing 3-5 steps with a railing? : A Little 6 Click Score: 23    End of Session Equipment Utilized During Treatment: Back brace Activity Tolerance: Patient tolerated treatment well Patient left: in bed;with call bell/phone within reach(sitting EOB with OT present) Nurse Communication: Mobility status PT Visit Diagnosis: Pain;Difficulty in walking, not elsewhere classified (R26.2) Pain - part of body: (back)    Time: [id number] PT Time  Calculation (min) (ACUTE ONLY): 18 min   Charges:   PT Evaluation $PT Eval Moderate Complexity: 1 Mod          Marisa Severin, PT, DPT Acute Rehabilitation Services Pager [phone] Office [place] 01/23/2020, 9:10 AM

## 2020-01-23 NOTE — Progress Notes (Signed)
Postop day 1.  Patient overall doing reasonably well.  Complains of incisional pain.  Preoperative radicular pain completely resolved.  No complaints of numbness or weakness.  Afebrile.  Vital signs are stable.  Motor and sensory function intact.  Abdomen soft.  Chest benign.  Wound clean and dry.  Patient mobilizing somewhat slowly secondary to incisional pain.  We will continue.  Work on mobilization.  Today.  Possible discharge later today or tomorrow.

## 2020-01-23 NOTE — Evaluation (Addendum)
Occupational Therapy Evaluation Patient Details Name: Carl [name] MRN: [id number] DOB: May 12, 1956 Today's Date: 01/23/2020    History of Present Illness Pt is a 64 yo male s/p Bilateral L5-S1 decompressive laminotomy and foraminotomies, redo, L5-S1 PLIF. PMHx: CVA, HTN, current smoker, arthritis.   Clinical Impression   Pt PTA: Pt was independent with ADL and mobility. PT currently performing minA overall  For ADL and minguardA for mobility. Pt in distress over severe pain. Pt to be seen later today or tomorrow in order to perform LB ADL. Pt reports "my spouse will assist as needed." Pt ambulating well with RW in room and able to brace donning/doffing. Back handout provided and reviewed ADL in detail. Pt educated on: clothing between brace, never sleep in brace, set an alarm at night for medication, avoid sitting for long periods of time, correct bed positioning for sleeping, correct sequence for bed mobility, avoiding lifting more than 5 pounds and never wash directly over incision. All education is complete and patient indicates understanding. Pt would benefit from continued OT skilled services for ADL, mobility and safety acutely.     Follow Up Recommendations  No OT follow up    Equipment Recommendations  None recommended by OT    Recommendations for Other Services       Precautions / Restrictions Precautions Precautions: Back Precaution Booklet Issued: Yes (comment) Precaution Comments: Reviewed back precautions and handout Required Braces or Orthoses: Spinal Brace Spinal Brace: Applied in standing position;Lumbar corset Restrictions Weight Bearing Restrictions: No      Mobility Bed Mobility Overal bed mobility: Needs Assistance Bed Mobility: Rolling;Sidelying to Sit;Sit to Sidelying Rolling: Supervision Sidelying to sit: Supervision     Sit to sidelying: Supervision General bed mobility comments: Pt performing log roll without education required; use of  rail.  Transfers Overall transfer level: Needs assistance Equipment used: Rolling walker (2 wheeled) Transfers: Sit to/from Stand Sit to Stand: Supervision         General transfer comment: safety    Balance Overall balance assessment: Mild deficits observed, not formally tested                                         ADL either performed or assessed with clinical judgement   ADL Overall ADL's : Needs assistance/impaired Eating/Feeding: Modified independent;Sitting   Grooming: Min guard;Standing;Modified independent;Sitting   Upper Body Bathing: Min guard;Standing   Lower Body Bathing: Minimal assistance;Cueing for safety;Sitting/lateral leans;Sit to/from stand Lower Body Bathing Details (indicate cue type and reason): education provided for LB AE Upper Body Dressing : Min guard;Standing   Lower Body Dressing: Min guard;Sitting/lateral leans;Sit to/from stand Lower Body Dressing Details (indicate cue type and reason): education provided for LB AE Toilet Transfer: Min guard;Ambulation;RW;BSC   Toileting- Clothing Manipulation and Hygiene: Minimal assistance;Cueing for safety;Sitting/lateral lean;Sit to/from stand Toileting - Clothing Manipulation Details (indicate cue type and reason): pt with internal rotation and shoulder extension for task     Functional mobility during ADLs: Min guard;Rolling walker;Cueing for safety General ADL Comments: Pt in distress over severe pain. Pt to be seen later today or tomorrow in order to perform LB ADL. Pt reports "my spouse will assist as needed."     Vision Baseline Vision/History: No visual deficits Patient Visual Report: No change from baseline Vision Assessment?: No apparent visual deficits     Perception     Praxis  Pertinent Vitals/Pain Pain Assessment: 0-10 Pain Score: 4  Pain Location: back Pain Descriptors / Indicators: Operative site guarding;Sharp Pain Intervention(s): Monitored during  session;Limited activity within patient's tolerance     Hand Dominance Right   Extremity/Trunk Assessment Upper Extremity Assessment Upper Extremity Assessment: Overall WFL for tasks assessed   Lower Extremity Assessment Lower Extremity Assessment: Overall WFL for tasks assessed   Cervical / Trunk Assessment Cervical / Trunk Assessment: Other exceptions Cervical / Trunk Exceptions: s/p spine surgery   Communication Communication Communication: No difficulties   Cognition Arousal/Alertness: Awake/alert Behavior During Therapy: WFL for tasks assessed/performed Overall Cognitive Status: Within Functional Limits for tasks assessed                                     General Comments  Pt reports that his MD is coming in and hoping to change his pain medicine due to severe pain.    Exercises     Shoulder Instructions      Home Living Family/patient expects to be discharged to:: Private residence Living Arrangements: Spouse/significant other;Children Available Help at Discharge: Family;Available 24 hours/day Type of Home: House Home Access: Stairs to enter [employer] of Steps: 2 Entrance Stairs-Rails: Left Home Layout: One level     Bathroom Shower/Tub: Teacher, early years/pre: Standard Bathroom Accessibility: Yes   Home Equipment: Cane - single point;Shower seat;Bedside commode;Adaptive equipment Adaptive Equipment: Reacher        Prior Functioning/Environment Level of Independence: Independent        Comments: Loves to play pool. Sells wood.        OT Problem List: Decreased strength;Decreased activity tolerance;Impaired balance (sitting and/or standing);Decreased safety awareness;Pain;Increased edema;Decreased knowledge of use of DME or AE;Decreased knowledge of precautions      OT Treatment/Interventions: Self-care/ADL training;Therapeutic exercise;Energy conservation;DME and/or AE instruction;Therapeutic  activities;Patient/family education;Balance training    OT Goals(Current goals can be found in the care plan section) Acute Rehab OT Goals Patient Stated Goal: to get back to playing pool OT Goal Formulation: With patient Time For Goal Achievement: 02/06/20 Potential to Achieve Goals: Good ADL Goals Pt Will Perform Lower Body Dressing: with set-up;sitting/lateral leans;sit to/from stand;with adaptive equipment  OT Frequency: Min 2X/week   Barriers to D/C:            Co-evaluation              AM-PAC OT "6 Clicks" Daily Activity     Outcome Measure Help from another person eating meals?: None Help from another person taking care of personal grooming?: A Little Help from another person toileting, which includes using toliet, bedpan, or urinal?: A Little Help from another person bathing (including washing, rinsing, drying)?: A Little Help from another person to put on and taking off regular upper body clothing?: None Help from another person to put on and taking off regular lower body clothing?: A Little 6 Click Score: 20   End of Session Equipment Utilized During Treatment: Rolling walker;Back brace Nurse Communication: Mobility status;Patient requests pain meds  Activity Tolerance: Patient limited by pain Patient left: in bed;with call bell/phone within reach  OT Visit Diagnosis: Muscle weakness (generalized) (M62.81);Pain Pain - part of body: (back)                Time: [id number] OT Time Calculation (min): 19 min Charges:  OT General Charges $OT Visit: 1 Visit OT Evaluation $OT Eval Moderate Complexity:  [place], OTR/L Acute Rehabilitation Services Pager: [phone] Office: [phone]  Skipper Dacosta  C 01/23/2020, 2:52 PM

## 2020-01-24 ENCOUNTER — Other Ambulatory Visit: Payer: Medicare HMO

## 2020-01-24 DIAGNOSIS — M4807 Spinal stenosis, lumbosacral region: Secondary | ICD-10-CM | POA: Diagnosis not present

## 2020-01-24 MED ORDER — DIAZEPAM 5 MG PO TABS: 5.0000 mg | ORAL_TABLET | Freq: Four times a day (QID) | ORAL | 0 refills | Status: DC | PRN

## 2020-01-24 MED ORDER — OXYCODONE HCL 10 MG PO TABS: 10.0000 mg | ORAL_TABLET | ORAL | 0 refills | Status: DC | PRN

## 2020-01-24 NOTE — Progress Notes (Signed)
Occupational Therapy Treatment and Discharge Patient Details Name: Carl [name] MRN: [id number] DOB: 12/04/1955 Today's Date: 01/24/2020    History of present illness Pt is a 63 yo male s/p Bilateral L5-S1 decompressive laminotomy and foraminotomies, redo, L5-S1 PLIF. PMHx: CVA, HTN, current smoker, arthritis.   OT comments  Pt progressing well toward stated goals, okay to d/c from OT services. Reviewed back precautions and AE for safe d/c. Pt able to recall 3/3 precautions, simulate LB dressing, and vocalize how he will safely shower and complete toilet transfer with needed DME. Pt is completing functional mobility at mod I level. Pt has wife to assist as needed, he prefers she assist with dressing at this time. No further OT needs identified, OT will sign off. Thank you for this consult.   Follow Up Recommendations  No OT follow up    Equipment Recommendations  None recommended by OT    Recommendations for Other Services      Precautions / Restrictions Precautions Precautions: Back Precaution Booklet Issued: No(given at eval) Precaution Comments: reviewed in context of BADL, pt able to recall sheet without directly looking Required Braces or Orthoses: Spinal Brace Spinal Brace: Applied in sitting position;Lumbar corset Restrictions Weight Bearing Restrictions: No       Mobility Bed Mobility Overal bed mobility: Needs Assistance Bed Mobility: Rolling;Sidelying to Sit;Sit to Sidelying Rolling: Supervision Sidelying to sit: Supervision     Sit to sidelying: Supervision    Transfers                      Balance Overall balance assessment: Mild deficits observed, not formally tested                                         ADL either performed or assessed with clinical judgement   ADL Overall ADL's : Needs assistance/impaired             Lower Body Bathing: Minimal assistance Lower Body Bathing Details (indicate cue type and reason):  plans to have wife assist     Lower Body Dressing: Min guard;Sitting/lateral leans;Sit to/from stand Lower Body Dressing Details (indicate cue type and reason): pt able to simulate and recall racher usage, otherwise independent in directing wife to assist. States he will wear shorts and bedroom shoes around the house               General ADL Comments: Reviewed precautions, AE, and had pt recall and simulated BADLs for home d/c     Vision Patient Visual Report: No change from baseline     Perception     Praxis      Cognition Arousal/Alertness: Awake/alert Behavior During Therapy: WFL for tasks assessed/performed Overall Cognitive Status: Within Functional Limits for tasks assessed                                          Exercises     Shoulder Instructions       General Comments      Pertinent Vitals/ Pain       Pain Assessment: 0-10 Pain Score: 4  Pain Location: back Pain Descriptors / Indicators: Operative site guarding;Sharp Pain Intervention(s): Limited activity within patient's tolerance  Home Living                                            Prior Functioning/Environment              Frequency           Progress Toward Goals  OT Goals(current goals can now be found in the care plan section)  Progress towards OT goals: Goals met/education completed, patient discharged from OT  Acute Rehab OT Goals Patient Stated Goal: to get back to playing pool OT Goal Formulation: All assessment and education complete, DC therapy  Plan Discharge plan remains appropriate;All goals met and education completed, patient discharged from OT services    Co-evaluation                 AM-PAC OT "6 Clicks" Daily Activity     Outcome Measure                    End of Session Equipment Utilized During Treatment: Rolling walker;Back brace  OT Visit Diagnosis: Muscle weakness (generalized) (M62.81);Pain Pain -  part of body: (back)   Activity Tolerance Patient tolerated treatment well   Patient Left in bed;with call bell/phone within reach   Nurse Communication Mobility status;Patient requests pain meds        Time: 0842-0851 OT Time Calculation (min): 9 min  Charges: OT General Charges $OT Visit: 1 Visit OT Treatments $Self Care/Home Management : 8-22 mins  Kaylee Roebuck, MSOT, OTR/L Acute Rehabilitation Services [facility] Office Number: [phone] Pager: [phone]  Kaylee Roebuck 01/24/2020, 9:38 AM    

## 2020-01-24 NOTE — Discharge Instructions (Signed)

## 2020-01-24 NOTE — Discharge Summary (Signed)
Physician Discharge Summary  Patient ID: Carl [name] MRN: [id number] DOB/AGE: 1956-05-08 64 y.o.  Admit date: 01/22/2020 Discharge date: 01/24/2020  Admission Diagnoses:  Discharge Diagnoses:  Active Problems:   Lumbar foraminal stenosis   Discharged Condition: good  Hospital Course: Patient admitted to the hospital where he underwent uncomplicated [id number] decompression and fusion.  Postop patient is doing well.  His preoperative lower extremity radicular pain is completely resolved.  He has the expected amount of lumbar incisional pain.  He is mobilizing.  He is standing ambulating and voiding without difficulty.  Ready for discharge home.  Consults:   Significant Diagnostic Studies:   Treatments:   Discharge Exam: Blood pressure 125/76, pulse 91, temperature 98.9 F (37.2 C), temperature source Oral, resp. rate 18, height 5\' 9"  (1.753 m), weight 93 kg, SpO2 97 %. Awake and alert.  Oriented.  Motor and sensory function intact.  Wound clean and dry.  Chest and abdomen benign.  Disposition: Discharge disposition: 01-Home or Self Care        Allergies as of 01/24/2020   No Known Allergies     Medication List    TAKE these medications   aspirin 81 MG EC tablet Take 81 mg by mouth daily.   diazepam 5 MG tablet Commonly known as: VALIUM Take 1-2 tablets (5-10 mg total) by mouth every 6 (six) hours as needed for muscle spasms.   fenofibrate 145 MG tablet Commonly known as: TRICOR Take 145 mg by mouth daily.   ibuprofen 200 MG tablet Commonly known as: ADVIL Take 400 mg by mouth every 6 (six) hours as needed for headache or moderate pain.   levothyroxine 112 MCG tablet Commonly known as: SYNTHROID Take 112 mcg by mouth daily.   lisinopril 20 MG tablet Commonly known as: ZESTRIL Take 1 tablet (20 mg total) by mouth every morning. What changed: when to take this   nitroGLYCERIN 0.4 MG SL tablet Commonly known as: NITROSTAT Place 1 tablet (0.4 mg total) under the  tongue every 5 (five) minutes as needed for chest pain. What changed: Another medication with the same name was changed. Make sure you understand how and when to take each.   nitroGLYCERIN 0.3 mg/hr patch Commonly known as: NITRODUR - Dosed in mg/24 hr Place 1 patch (0.3 mg total) onto the skin as needed. What changed: reasons to take this   omeprazole 40 MG capsule Commonly known as: PRILOSEC Take 40 mg by mouth daily.   Oxycodone HCl 10 MG Tabs Take 1 tablet (10 mg total) by mouth every 3 (three) hours as needed for severe pain ((score 7 to 10)).   rosuvastatin 40 MG tablet Commonly known as: CRESTOR Take 40 mg by mouth every morning.            Durable Medical Equipment  (From admission, onward)         Start     Ordered   01/22/20 1924  DME Walker rolling  Once    Question:  Patient needs a walker to treat with the following condition  Answer:  Lumbar foraminal stenosis   01/22/20 1923   01/22/20 1924  DME 3 n 1  Once     01/22/20 1923           Signed: Cooper Render Eward Rutigliano 01/24/2020, 8:24 AM

## 2020-01-24 NOTE — Plan of Care (Signed)
Pt and wife given D/C instructions with verbal understanding. Rx's were sent to Pt's pharmacy by MD. Pt's incision is clean and dry with no sign of infection. Pt's IV was removed prior to D/C. RW was given to Pt prior to D/C per MD order. Pt D/C'd home via wheelchair per MD order. Pt is stable @ D/C and has no other needs at this time. Holli Humbles, RN

## 2020-01-24 NOTE — Progress Notes (Signed)
Physical Therapy Treatment Patient Details Name: Carl [name] MRN: [id number] DOB: Jul 23, 1956 Today's Date: 01/24/2020    History of Present Illness Pt is a 64 yo male s/p Bilateral L5-S1 decompressive laminotomy and foraminotomies, redo, L5-S1 PLIF. PMHx: CVA, HTN, current smoker, arthritis.    PT Comments    Pt making steady progress towards achieving his current functional mobility goals. Plan is to d/c home today with wife's support. Focus of session was on pt education re: back precautions in regards to ADLs/IADLs, car transfers with demonstration and a generalized walking program for pt to initiate upon d/c home. Pt expressed understanding.    Follow Up Recommendations  No PT follow up;Supervision - Intermittent     Equipment Recommendations  Rolling walker with 5" wheels    Recommendations for Other Services       Precautions / Restrictions Precautions Precautions: Back Precaution Booklet Issued: No(given at eval) Precaution Comments: pt able to recall 3/3 back precautions Required Braces or Orthoses: Spinal Brace Spinal Brace: Applied in sitting position;Lumbar corset Restrictions Weight Bearing Restrictions: No    Mobility  Bed Mobility Overal bed mobility: Needs Assistance Bed Mobility: Rolling;Sidelying to Sit Rolling: Supervision Sidelying to sit: Supervision     Sit to sidelying: Supervision General bed mobility comments: pt demonstrated good use of log roll technique  Transfers                 General transfer comment: pt declining as wife was on her way to assist him with dressing prior to d/c home; focus of session was on pt ed  Ambulation/Gait                 Stairs             Wheelchair Mobility    Modified Rankin (Stroke Patients Only)       Balance Overall balance assessment: Mild deficits observed, not formally tested                                          Cognition Arousal/Alertness:  Awake/alert Behavior During Therapy: WFL for tasks assessed/performed Overall Cognitive Status: Within Functional Limits for tasks assessed                                        Exercises      General Comments        Pertinent Vitals/Pain Pain Assessment: Faces Pain Score: 4  Faces Pain Scale: Hurts whole lot Pain Location: back Pain Descriptors / Indicators: Operative site guarding;Sharp Pain Intervention(s): Monitored during session;Repositioned;Patient requesting pain meds-RN notified    Home Living                      Prior Function            PT Goals (current goals can now be found in the care plan section) Acute Rehab PT Goals Patient Stated Goal: to get back to playing pool PT Goal Formulation: With patient Time For Goal Achievement: 02/06/20 Potential to Achieve Goals: Good Progress towards PT goals: Progressing toward goals    Frequency    Min 5X/week      PT Plan Current plan remains appropriate    Co-evaluation  AM-PAC PT "6 Clicks" Mobility   Outcome Measure  Help needed turning from your back to your side while in a flat bed without using bedrails?: None Help needed moving from lying on your back to sitting on the side of a flat bed without using bedrails?: None Help needed moving to and from a bed to a chair (including a wheelchair)?: None Help needed standing up from a chair using your arms (e.g., wheelchair or bedside chair)?: None Help needed to walk in hospital room?: None Help needed climbing 3-5 steps with a railing? : A Little 6 Click Score: 23    End of Session   Activity Tolerance: Patient tolerated treatment well Patient left: in bed;with call bell/phone within reach;Other (comment)(sitting EOB) Nurse Communication: Mobility status PT Visit Diagnosis: Pain;Difficulty in walking, not elsewhere classified (R26.2) Pain - part of body: (back)     Time: [id number] PT Time Calculation  (min) (ACUTE ONLY): 13 min  Charges:  $Therapeutic Activity: 8-22 mins                     Anastasio Champion, DPT  Acute Rehabilitation Services Pager [phone] Office [place] 01/24/2020, 10:00 AM

## 2020-01-24 NOTE — Anesthesia Postprocedure Evaluation (Signed)
Anesthesia Post Note  Patient: Carl [name]  Procedure(s) Performed: Posterior Lumbar Interbody Fusion - Lumbar Five-Sacral One ([name] Back)     Patient location during evaluation: PACU Anesthesia Type: General Level of consciousness: awake and alert Pain management: pain level controlled Vital Signs Assessment: post-procedure vital signs reviewed and stable Respiratory status: spontaneous breathing, nonlabored ventilation, respiratory function stable and patient connected to nasal cannula oxygen Cardiovascular status: blood pressure returned to baseline and stable Postop Assessment: no apparent nausea or vomiting Anesthetic complications: no    Last Vitals:  Vitals:   01/24/20 0352 01/24/20 0737  BP: 124/62 125/76  Pulse: 91 91  Resp: 20 18  Temp: (!) 38.2 C 37.2 C  SpO2: 90% 97%    Last Pain:  Vitals:   01/24/20 0950  TempSrc:   PainSc: 8                  Junaid Wurzer

## 2020-01-29 MED FILL — Heparin Sodium (Porcine) Inj 1000 Unit/ML: INTRAMUSCULAR | Qty: 30 | Status: AC

## 2020-01-29 MED FILL — Sodium Chloride IV Soln 0.9%: INTRAVENOUS | Qty: 1000 | Status: AC
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
## 2020-04-16 ENCOUNTER — Other Ambulatory Visit: Payer: Self-pay

## 2020-04-16 ENCOUNTER — Ambulatory Visit: Payer: Medicare HMO | Admitting: Interventional Cardiology

## 2020-04-16 ENCOUNTER — Encounter: Payer: Self-pay | Admitting: Interventional Cardiology

## 2020-04-16 VITALS — BP 108/78 | HR 68 | Ht 69.0 in | Wt 193.0 lb

## 2020-04-16 DIAGNOSIS — I25118 Atherosclerotic heart disease of native coronary artery with other forms of angina pectoris: Secondary | ICD-10-CM

## 2020-04-16 DIAGNOSIS — I10 Essential (primary) hypertension: Secondary | ICD-10-CM

## 2020-04-16 DIAGNOSIS — Z720 Tobacco use: Secondary | ICD-10-CM | POA: Diagnosis not present

## 2020-04-16 DIAGNOSIS — I201 Angina pectoris with documented spasm: Secondary | ICD-10-CM | POA: Diagnosis not present

## 2020-04-16 NOTE — Patient Instructions (Signed)
Medication Instructions:  Your physician recommends that you continue on your current medications as directed. Please refer to the Current Medication list given to you today.  *If you need a refill on your cardiac medications before your next appointment, please call your pharmacy*   Lab Work: None  If you have labs (blood work) drawn today and your tests are completely normal, you will receive your results only by: . MyChart Message (if you have MyChart) OR . A paper copy in the mail If you have any lab test that is abnormal or we need to change your treatment, we will call you to review the results.   Testing/Procedures: None  Follow-Up: At [facility], you and your health needs are our priority.  As part of our continuing mission to provide you with exceptional heart care, we have created designated Provider Care Teams.  These Care Teams include your primary Cardiologist (physician) and Advanced Practice Providers (APPs -  Physician Assistants and Nurse Practitioners) who all work together to provide you with the care you need, when you need it.  We recommend signing up for the patient portal called "MyChart".  Sign up information is provided on this After Visit Summary.  MyChart is used to connect with patients for Virtual Visits (Telemedicine).  Patients are able to view lab/test results, encounter notes, upcoming appointments, etc.  Non-urgent messages can be sent to your provider as well.   To learn more about what you can do with MyChart, go to [email or web address].    Your next appointment:   12 month(s)  The format for your next appointment:   In Person  Provider:   You may see Jayadeep Varanasi, MD or one of the following Advanced Practice Providers on your designated Care Team:    Dayna Dunn, PA-C  Michele Lenze, PA-C    Other Instructions  High-Fiber Diet Fiber, also called dietary fiber, is a type of carbohydrate that is found in fruits, vegetables, whole  grains, and beans. A high-fiber diet can have many health benefits. Your health care provider may recommend a high-fiber diet to help:  Prevent constipation. Fiber can make your bowel movements more regular.  Lower your cholesterol.  Relieve the following conditions: ? Swelling of veins in the anus (hemorrhoids). ? Swelling and irritation (inflammation) of specific areas of the digestive tract (uncomplicated diverticulosis). ? A problem of the large intestine (colon) that sometimes causes pain and diarrhea (irritable bowel syndrome, IBS).  Prevent overeating as part of a weight-loss plan.  Prevent heart disease, type 2 diabetes, and certain cancers. What is my plan? The recommended daily fiber intake in grams (g) includes:  38 g for men age 50 or younger.  30 g for men over age 50.  25 g for women age 50 or younger.  21 g for women over age 50. You can get the recommended daily intake of dietary fiber by:  Eating a variety of fruits, vegetables, grains, and beans.  Taking a fiber supplement, if it is not possible to get enough fiber through your diet. What do I need to know about a high-fiber diet?  It is better to get fiber through food sources rather than from fiber supplements. There is not a lot of research about how effective supplements are.  Always check the fiber content on the nutrition facts label of any prepackaged food. Look for foods that contain 5 g of fiber or more per serving.  Talk with a diet and nutrition specialist (dietitian) if you   have questions about specific foods that are recommended or not recommended for your medical condition, especially if those foods are not listed below.  Gradually increase how much fiber you consume. If you increase your intake of dietary fiber too quickly, you may have bloating, cramping, or gas.  Drink plenty of water. Water helps you to digest fiber. What are tips for following this plan?  Eat a wide variety of high-fiber  foods.  Make sure that half of the grains that you eat each day are whole grains.  Eat breads and cereals that are made with whole-grain flour instead of refined flour or white flour.  Eat brown rice, bulgur wheat, or millet instead of white rice.  Start the day with a breakfast that is high in fiber, such as a cereal that contains 5 g of fiber or more per serving.  Use beans in place of meat in soups, salads, and pasta dishes.  Eat high-fiber snacks, such as berries, raw vegetables, nuts, and popcorn.  Choose whole fruits and vegetables instead of processed forms like juice or sauce. What foods can I eat?  Fruits Berries. Pears. Apples. Oranges. Avocado. Prunes and raisins. Dried figs. Vegetables Sweet potatoes. Spinach. Kale. Artichokes. Cabbage. Broccoli. Cauliflower. Green peas. Carrots. Squash. Grains Whole-grain breads. Multigrain cereal. Oats and oatmeal. Brown rice. Barley. Bulgur wheat. Millet. Quinoa. Bran muffins. Popcorn. Rye wafer crackers. Meats and other proteins Navy, kidney, and pinto beans. Soybeans. Split peas. Lentils. Nuts and seeds. Dairy Fiber-fortified yogurt. Beverages Fiber-fortified soy milk. Fiber-fortified orange juice. Other foods Fiber bars. The items listed above may not be a complete list of recommended foods and beverages. Contact a dietitian for more options. What foods are not recommended? Fruits Fruit juice. Cooked, strained fruit. Vegetables Fried potatoes. Canned vegetables. Well-cooked vegetables. Grains White bread. Pasta made with refined flour. White rice. Meats and other proteins Fatty cuts of meat. Fried chicken or fried fish. Dairy Milk. Yogurt. Cream cheese. Sour cream. Fats and oils Butters. Beverages Soft drinks. Other foods Cakes and pastries. The items listed above may not be a complete list of foods and beverages to avoid. Contact a dietitian for more information. Summary  Fiber is a type of carbohydrate. It is  found in fruits, vegetables, whole grains, and beans.  There are many health benefits of eating a high-fiber diet, such as preventing constipation, lowering blood cholesterol, helping with weight loss, and reducing your risk of heart disease, diabetes, and certain cancers.  Gradually increase your intake of fiber. Increasing too fast can result in cramping, bloating, and gas. Drink plenty of water while you increase your fiber.  The best sources of fiber include whole fruits and vegetables, whole grains, nuts, seeds, and beans. This information is not intended to replace advice given to you by your health care provider. Make sure you discuss any questions you have with your health care provider. Document Revised: 07/12/2017 Document Reviewed: 07/12/2017 Elsevier Patient Education  2020 Elsevier Inc.   
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
## 2021-04-18 ENCOUNTER — Encounter: Payer: Self-pay | Admitting: Interventional Cardiology

## 2021-04-18 ENCOUNTER — Other Ambulatory Visit: Payer: Self-pay

## 2021-04-18 ENCOUNTER — Ambulatory Visit: Payer: Medicare HMO | Admitting: Interventional Cardiology

## 2021-04-18 VITALS — BP 150/100 | HR 53 | Ht 69.0 in | Wt 197.4 lb

## 2021-04-18 DIAGNOSIS — I10 Essential (primary) hypertension: Secondary | ICD-10-CM

## 2021-04-18 DIAGNOSIS — Z720 Tobacco use: Secondary | ICD-10-CM | POA: Diagnosis not present

## 2021-04-18 DIAGNOSIS — I25118 Atherosclerotic heart disease of native coronary artery with other forms of angina pectoris: Secondary | ICD-10-CM | POA: Diagnosis not present

## 2021-04-18 DIAGNOSIS — I201 Angina pectoris with documented spasm: Secondary | ICD-10-CM | POA: Diagnosis not present

## 2021-04-18 MED ORDER — AMLODIPINE BESYLATE 5 MG PO TABS
5.0000 mg | ORAL_TABLET | Freq: Every day | ORAL | 3 refills | Status: DC
Start: 2021-04-18 — End: 2022-04-13

## 2021-04-18 NOTE — Patient Instructions (Addendum)
Medication Instructions:  Your physician recommends that you continue on your current medications as directed. Please refer to the Current Medication list given to you today. We will contact [facility] to check on your lisinopril dose *If you need a refill on your cardiac medications before your next appointment, please call your pharmacy*   Lab Work: none If you have labs (blood work) drawn today and your tests are completely normal, you will receive your results only by: [place] (if you have MyChart) OR A paper copy in the mail If you have any lab test that is abnormal or we need to change your treatment, we will call you to review the results.   Testing/Procedures: none   Follow-Up: At [facility], you and your health needs are our priority.  As part of our continuing mission to provide you with exceptional heart care, we have created designated Provider Care Teams.  These Care Teams include your primary Cardiologist (physician) and Advanced Practice Providers (APPs -  Physician Assistants and Nurse Practitioners) who all work together to provide you with the care you need, when you need it.  We recommend signing up for the patient portal called "MyChart".  Sign up information is provided on this After Visit Summary.  MyChart is used to connect with patients for Virtual Visits (Telemedicine).  Patients are able to view lab/test results, encounter notes, upcoming appointments, etc.  Non-urgent messages can be sent to your provider as well.   To learn more about what you can do with MyChart, go to [email or web address].    Your next appointment:   12 month(s)  The format for your next appointment:   In Person  Provider:   You may see Larae Grooms, MD or one of the following Advanced Practice Providers on your designated Care Team:   Melina Copa, PA-C Ermalinda Barrios, PA-C   Other Instructions Check BP at home and keep record of readings.  Call readings into office in  about 2 weeks.

## 2021-07-04 ENCOUNTER — Telehealth: Payer: Self-pay | Admitting: *Deleted

## 2021-07-04 NOTE — Telephone Encounter (Signed)
   Pre-operative Risk Assessment    Patient Name: Carl [name]  DOB: Jan 16, 1956 MRN: [id number]      Request for Surgical Clearance   Procedure:   L4-5 LUMBAR LAMINECTOMY  Date of Surgery: Clearance TBD                                 Surgeon:  DR. Earnie Larsson Surgeon's Group or Practice Name:  [facility] Phone number:  [phone] Fax number:  [phone] ATTN: Lorriane Shire X 244   Type of Clearance Requested: - Medical ASA   Type of Anesthesia:   General    Additional requests/questions:   Jiles Prows   07/04/2021, 6:05 PM

## 2021-07-07 NOTE — Telephone Encounter (Signed)
Left a message for the patient to call back and speak to the on-call preop APP of the day 

## 2021-07-07 NOTE — Telephone Encounter (Signed)
   Name: Carl [name]  DOB: 09/15/1956  MRN: [id number]   Primary Cardiologist: Larae Grooms, MD  Chart reviewed as part of pre-operative protocol coverage. Patient was contacted 07/07/2021 in reference to pre-operative risk assessment for pending surgery as outlined below.  MEIR ELWOOD was last seen on 04/18/2021 by Dr. Irish Lack.  Since that day, YANNIS BROCE has done well without exertional chest pain or worsening dyspnea.  Therefore, based on ACC/AHA guidelines, the patient would be at acceptable risk for the planned procedure without further cardiovascular testing.   He may hold aspirin for 5-7 days and restart it as soon as possible afterward at the surgeon's discretion.   The patient was advised that if he develops new symptoms prior to surgery to contact our office to arrange for a follow-up visit, and he verbalized understanding.  I will route this recommendation to the requesting party via Epic fax function and remove from pre-op pool. Please call with questions.  [place], [place] 07/07/2021, 10:25 AM

## 2022-04-12 ENCOUNTER — Other Ambulatory Visit: Payer: Self-pay | Admitting: Interventional Cardiology

## 2022-05-04 ENCOUNTER — Ambulatory Visit: Payer: Medicare HMO | Admitting: Podiatry

## 2022-05-04 ENCOUNTER — Ambulatory Visit (INDEPENDENT_AMBULATORY_CARE_PROVIDER_SITE_OTHER): Payer: Medicare HMO

## 2022-05-04 DIAGNOSIS — M10472 Other secondary gout, left ankle and foot: Secondary | ICD-10-CM

## 2022-05-04 DIAGNOSIS — M25572 Pain in left ankle and joints of left foot: Secondary | ICD-10-CM

## 2022-05-04 DIAGNOSIS — M10072 Idiopathic gout, left ankle and foot: Secondary | ICD-10-CM | POA: Diagnosis not present

## 2022-05-04 NOTE — Progress Notes (Signed)
  Subjective:  Patient ID: Carl [name], male    DOB: 1956/01/26,  MRN: [id number]  Chief Complaint  Patient presents with   Foot Pain    Patient reports no known injury. Onset about 1 month ago. Swelling in the ankle with pain. Patient reports using ice and elevation for treatment    66 y.o. male presents with the above complaint. History confirmed with patient.  No personal history of gout as far as he knows  Objective:  Physical Exam: warm, good capillary refill, no trophic changes or ulcerative lesions, normal DP and PT pulses, and normal sensory exam. Left Foot:  Swelling compared to right side, there is pain and tenderness in the sinus tarsi and anterior ankle joint   Radiographs: Multiple views x-ray of left foot and ankle were taken: no fracture, dislocation, swelling or degenerative changes noted Assessment:   1. Left ankle pain, unspecified chronicity   2. Other secondary acute gout of left ankle   3. Sinus tarsi syndrome, left      Plan:  Patient was evaluated and treated and all questions answered.  I reviewed the x-rays with the patient.  There is no acute osseous abnormalities.  He does have clinical swelling and pain in the sinus tarsi and anterior ankle joint.  Discussed with him this could be sinus tarsitis or gout of the left ankle.  I recommended corticosteroid injection.  Following sterile prep with Betadine 10 mg of Kenalog and 2 mg dexamethasone was injected in the left ankle.  He tolerated this well.  I also recommended evaluating lab work on him to evaluate for gout and other arthritis.  Panel was ordered and he will get this done at [facility].  May consider methylprednisolone taper if it does not improve or positive for gout  Return if symptoms worsen or fail to improve.

## 2022-05-06 ENCOUNTER — Other Ambulatory Visit: Payer: Self-pay | Admitting: Podiatry

## 2022-05-06 DIAGNOSIS — M10072 Idiopathic gout, left ankle and foot: Secondary | ICD-10-CM

## 2022-05-08 LAB — ARTHRITIS PANEL
Anti Nuclear Antibody (ANA): NEGATIVE
Rheumatoid fact SerPl-aCnc: <10 IU/mL (ref <14.0)
Sed Rate: 2 mm/hr (ref 0–30)
Uric Acid: 4.8 mg/dL (ref 3.8–8.4)

## 2022-05-11 ENCOUNTER — Telehealth: Payer: Self-pay

## 2022-05-11 NOTE — Telephone Encounter (Signed)
Noted, thanks!

## 2022-05-12 ENCOUNTER — Other Ambulatory Visit: Payer: Self-pay | Admitting: Interventional Cardiology

## 2022-05-12 NOTE — Telephone Encounter (Signed)
Left and vm for the patient to call the office back about his lab results.

## 2022-05-13 ENCOUNTER — Other Ambulatory Visit: Payer: Self-pay

## 2022-05-13 DIAGNOSIS — M25572 Pain in left ankle and joints of left foot: Secondary | ICD-10-CM

## 2022-05-13 MED ORDER — METHYLPREDNISOLONE 4 MG PO TBPK: ORAL_TABLET | ORAL | 0 refills | Status: DC

## 2022-05-13 NOTE — Telephone Encounter (Signed)
Spoke to the patient and he stated that he was still have pains and swelling. I advised him that per his provider we are sending methylprednisolone 1 week taper. Patient understood and verbalized understanding of information given.

## 2022-05-21 ENCOUNTER — Other Ambulatory Visit: Payer: Self-pay | Admitting: Interventional Cardiology

## 2022-06-07 ENCOUNTER — Other Ambulatory Visit: Payer: Self-pay | Admitting: Interventional Cardiology

## 2022-06-17 ENCOUNTER — Other Ambulatory Visit: Payer: Self-pay | Admitting: Interventional Cardiology

## 2022-06-22 ENCOUNTER — Telehealth: Payer: Self-pay | Admitting: Interventional Cardiology
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
## 2022-06-22 NOTE — Telephone Encounter (Signed)
Pt c/o of Chest Pain: STAT if CP now or developed within 24 hours  1. Are you having CP right now? Not sure, pt's wife doesn't believe so.   2. Are you experiencing any other symptoms (ex. SOB, nausea, vomiting, sweating)? Numbness in face  3. How long have you been experiencing CP? Pt's wife states that she is not sure when it's started, but believes it started within the last two weeks  4. Is your CP continuous or coming and going? Coming and going   5. Have you taken Nitroglycerin? Yes   And she says he needs a refill nitroglycerin.  ?

## 2022-06-22 NOTE — Telephone Encounter (Signed)
Left a message for the pt to call back.   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  Has been off of NTG patch and feeling well at his 04/18/21 OV

## 2022-06-23 ENCOUNTER — Ambulatory Visit: Payer: Medicare HMO | Admitting: Podiatry

## 2022-06-24 ENCOUNTER — Telehealth (HOSPITAL_COMMUNITY): Payer: Self-pay | Admitting: *Deleted

## 2022-06-24 ENCOUNTER — Encounter: Payer: Self-pay | Admitting: Physician Assistant

## 2022-06-24 ENCOUNTER — Ambulatory Visit: Payer: Medicare HMO | Attending: Physician Assistant | Admitting: Physician Assistant

## 2022-06-24 VITALS — BP 128/80 | HR 55 | Ht 69.0 in | Wt 201.0 lb

## 2022-06-24 DIAGNOSIS — I25118 Atherosclerotic heart disease of native coronary artery with other forms of angina pectoris: Secondary | ICD-10-CM

## 2022-06-24 DIAGNOSIS — I739 Peripheral vascular disease, unspecified: Secondary | ICD-10-CM

## 2022-06-24 DIAGNOSIS — I10 Essential (primary) hypertension: Secondary | ICD-10-CM | POA: Diagnosis not present

## 2022-06-24 DIAGNOSIS — R079 Chest pain, unspecified: Secondary | ICD-10-CM

## 2022-06-24 MED ORDER — ISOSORBIDE MONONITRATE ER 30 MG PO TB24: 30.0000 mg | ORAL_TABLET | Freq: Every day | ORAL | 11 refills | Status: DC

## 2022-06-24 NOTE — Telephone Encounter (Signed)

## 2022-06-24 NOTE — Patient Instructions (Signed)
Medication Instructions:   START Imdur one (1) tablet by mouth ( 30 mg) daily.  Start with half tablet (15 mg) X 3 days than go to whole tablet.   *If you need a refill on your cardiac medications before your next appointment, please call your pharmacy*   Lab Work:  None ordered.  If you have labs (blood work) drawn today and your tests are completely normal, you will receive your results only by: [place] (if you have MyChart) OR A paper copy in the mail If you have any lab test that is abnormal or we need to change your treatment, we will call you to review the results.   Testing/Procedures:  You are scheduled for a Myocardial Perfusion Imaging Study on Wednesday, October 11 at 10:00 am.   Please arrive 15 minutes prior to your appointment time for registration and insurance purposes.   The test will take approximately 3 to 4 hours to complete; you may bring reading material. If someone comes with you to your appointment, they will need to remain in the main lobby due to limited space in the testing area.   How to prepare for your Myocardial Perfusion test:   Do not eat or drink 3 hours prior to your test, except you may have water.    Do not consume products containing caffeine (regular or decaffeinated) 12 hours prior to your test (ex: coffee, chocolate, soda, tea)   Do bring a list of your current medications with you. If not listed below, you may take your medications as normal.   Bring any held medication to your appointment, as you may be required to take it once the test is complete.   Do wear comfortable clothes (no overalls) and walking shoes. Tennis shoes are preferred. No  open toed shoes.  Do not wear cologne, aftershave or lotions (deodorant is allowed).   If these instructions are not followed, you test will have to be rescheduled.   Please report to [address] for your test. If you have questions or concerns about your  appointment, please call the Nuclear Lab at [phone].  If you cannot keep your appointment, please provide 24 hour notification to the Nuclear lab to avoid a possible $50 charge to your account.   Your physician has requested that you have an echocardiogram. Echocardiography is a painless test that uses sound waves to create images of your heart. It provides your doctor with information about the size and shape of your heart and how well your heart's chambers and valves are working. This procedure takes approximately one hour. There are no restrictions for this procedure.  Your physician has requested that you have a lower extremity arterial exercise duplex. During this test, exercise and ultrasound are used to evaluate arterial blood flow in the legs. Allow one hour for this exam. There are no restrictions or special instructions.     Follow-Up: At [facility], you and your health needs are our priority.  As part of our continuing mission to provide you with exceptional heart care, we have created designated Provider Care Teams.  These Care Teams include your primary Cardiologist (physician) and Advanced Practice Providers (APPs -  Physician Assistants and Nurse Practitioners) who all work together to provide you with the care you need, when you need it.  We recommend signing up for the patient portal called "MyChart".  Sign up information is provided on this After Visit Summary.  MyChart is used to  connect with patients for Virtual Visits (Telemedicine).  Patients are able to view lab/test results, encounter notes, upcoming appointments, etc.  Non-urgent messages can be sent to your provider as well.   To learn more about what you can do with MyChart, go to [email or web address].    Your next appointment:   5 week(s)  The format for your next appointment:   In Person  Provider:   Ermalinda Barrios, PA-C         Other Instructions Smoking Tobacco Information, Adult Smoking  tobacco can be harmful to your health. Tobacco contains a toxic colorless chemical called nicotine. Nicotine causes changes in your brain that make you want more and more. This is called addiction. This can make it hard to stop smoking once you start. Tobacco also has other toxic chemicals that can hurt your body and raise your risk of many cancers. Menthol or "lite" tobacco or cigarette brands are not safer than regular brands. How can smoking tobacco affect me? Smoking tobacco puts you at risk for: Cancer. Smoking is most commonly associated with lung cancer, but can also lead to cancer in other parts of the body. Chronic obstructive pulmonary disease (COPD). This is a long-term lung condition that makes it hard to breathe. It also gets worse over time. High blood pressure (hypertension), heart disease, stroke, heart attack, and lung infections, such as pneumonia. Cataracts. This is when the lenses in the eyes become clouded. Digestive problems. This may include peptic ulcers, heartburn, and gastroesophageal reflux disease (GERD). Oral health problems, such as gum disease, mouth sores, and tooth loss. Loss of taste and smell. Smoking also affects how you look and smell. Smoking may cause: Wrinkles. Yellow or stained teeth, fingers, and fingernails. Bad breath. Bad-smelling clothes and hair. Smoking tobacco can also affect your social life, because: It may be challenging to find places to smoke when away from home. Many workplaces, [employer], hotels, and public places are tobacco-free. Smoking is expensive. This is due to the cost of tobacco and the long-term costs of treating health problems from smoking. Secondhand smoke may affect those around you. Secondhand smoke can cause lung cancer, breathing problems, and heart disease. Children of smokers have a higher risk for: Sudden infant death syndrome (SIDS). Ear infections. Lung infections. What actions can I take to prevent health  problems? Quit smoking  Do not start smoking. Quit if you already smoke. Do not replace cigarette smoking with vaping devices, such as e-cigarettes. Make a plan to quit smoking and commit to it. Look for programs to help you, and ask your health care provider for recommendations and ideas. Set a date and write down all the reasons you want to quit. Let your friends and family know you are quitting so they can help and support you. Consider finding friends who also want to quit. It can be easier to quit with someone else, so that you can support each other. Talk with your health care provider about using nicotine replacement medicines to help you quit. These include gum, lozenges, patches, sprays, or pills. If you try to quit but return to smoking, stay positive. It is common to slip up when you first quit, so take it one day at a time. Be prepared for cravings. When you feel the urge to smoke, chew gum or suck on hard candy. Lifestyle Stay busy. Take care of your body. Get plenty of exercise, eat a healthy diet, and drink plenty of water. Find ways to manage your stress, such as  meditation, yoga, exercise, or time spent with friends and family. Ask your health care provider about having regular tests (screenings) to check for cancer. This may include blood tests, imaging tests, and other tests. Where to find support To get support to quit smoking, consider: Asking your health care provider for more information and resources. Joining a support group for people who want to quit smoking in your local community. There are many effective programs that may help you to quit. Calling the smokefree.gov counselor helpline at 1-800-QUIT-NOW (575) 454-2328). Where to find more information You may find more information about quitting smoking from: Centers for Disease Control and Prevention: https://www.chang-huffman.com/ https://hall.com/: smokefree.gov American Lung Association: freedomfromsmoking.org Contact a health  care provider if: You have problems breathing. Your lips, nose, or fingers turn blue. You have chest pain. You are coughing up blood. You feel like you will faint. You have other health changes that cause you to worry. Summary Smoking tobacco can negatively affect your health, the health of those around you, your finances, and your social life. Do not start smoking. Quit if you already smoke. If you need help quitting, ask your health care provider. Consider joining a support group for people in your local community who want to quit smoking. There are many effective programs that may help you to quit. This information is not intended to replace advice given to you by your health care provider. Make sure you discuss any questions you have with your health care provider. Document Revised: 09/02/2021 Document Reviewed: 09/02/2021 Elsevier Patient Education  [place].  If you are still having Chest Pain after starting new medication you need to call office to get in sooner.   Important Information About Sugar

## 2022-06-24 NOTE — Telephone Encounter (Signed)
Had ov today with APP.

## 2022-07-01 ENCOUNTER — Telehealth: Payer: Self-pay | Admitting: Interventional Cardiology

## 2022-07-01 ENCOUNTER — Ambulatory Visit (HOSPITAL_COMMUNITY): Payer: Medicare HMO | Attending: Cardiology

## 2022-07-01 DIAGNOSIS — R079 Chest pain, unspecified: Secondary | ICD-10-CM

## 2022-07-01 DIAGNOSIS — I25118 Atherosclerotic heart disease of native coronary artery with other forms of angina pectoris: Secondary | ICD-10-CM

## 2022-07-01 DIAGNOSIS — I10 Essential (primary) hypertension: Secondary | ICD-10-CM | POA: Diagnosis not present

## 2022-07-01 LAB — MYOCARDIAL PERFUSION IMAGING
Base ST Depression (mm): 0 mm
LV dias vol: 96 mL (ref 62–150)
LV sys vol: 44 mL
Nuc Stress EF: 54 %
Peak HR: 82 {beats}/min
Rest HR: 58 {beats}/min
Rest Nuclear Isotope Dose: 10.9 mCi
SDS: 1
SRS: 0
SSS: 1
ST Depression (mm): 0 mm
Stress Nuclear Isotope Dose: 30 mCi
TID: 1.09

## 2022-07-01 MED ORDER — TECHNETIUM TC 99M TETROFOSMIN IV KIT
30.0000 | PACK | Freq: Once | INTRAVENOUS | Status: AC | PRN
  Administered 2022-07-01: 30 via INTRAVENOUS

## 2022-07-01 MED ORDER — REGADENOSON 0.4 MG/5ML IV SOLN
0.4000 mg | Freq: Once | INTRAVENOUS | Status: AC
  Administered 2022-07-01: 0.4 mg via INTRAVENOUS

## 2022-07-01 MED ORDER — AMLODIPINE BESYLATE 5 MG PO TABS: 5.0000 mg | ORAL_TABLET | Freq: Every day | ORAL | 3 refills | Status: DC

## 2022-07-01 MED ORDER — TECHNETIUM TC 99M TETROFOSMIN IV KIT
10.9000 | PACK | Freq: Once | INTRAVENOUS | Status: AC | PRN
  Administered 2022-07-01: 10.9 via INTRAVENOUS

## 2022-07-01 NOTE — Telephone Encounter (Signed)
Pt's medication were sent to pt's pharmacy as requested. Confirmation received.  

## 2022-07-01 NOTE — Telephone Encounter (Signed)
*  STAT* If patient is at the pharmacy, call can be transferred to refill team.   1. Which medications need to be refilled? (please list name of each medication and dose if known)   amLODipine (NORVASC) 5 MG tablet    2. Which pharmacy/location (including street and city if local pharmacy) is medication to be sent to? [id number] [place],  - [address]  3. Do they need a 30 day or 90 day supply? [address]

## 2022-07-02 ENCOUNTER — Telehealth: Payer: Self-pay | Admitting: Interventional Cardiology

## 2022-07-02 NOTE — Telephone Encounter (Signed)
Attempted to call the pt but unable to leave a message due to the pts mailbox being full.    Imogene Burn, PA-C  07/02/2022  6:56 AM EDT     Stress test looks good with no ischemia, low risk. Ask how he's doing on Imdur with history of vasospasm and make sure he keeps f/u. thanks

## 2022-07-02 NOTE — Telephone Encounter (Signed)
Patient's wife states she is returning a call to discuss stress test results. She is requesting to have her call returned to the patient at [phone].

## 2022-07-07 NOTE — Telephone Encounter (Signed)
Spoke with pt and advised of stress test results per M.Lenze,PA-C.  Pt states he has not had any improvement of symptoms since starting Imdur but is taking the medication daily as prescribed.  He plans to keep testing and f/u appointments as scheduled.

## 2022-07-10 ENCOUNTER — Ambulatory Visit (HOSPITAL_COMMUNITY): Payer: Medicare HMO | Attending: Cardiology

## 2022-07-10 ENCOUNTER — Other Ambulatory Visit: Payer: Self-pay | Admitting: Physician Assistant

## 2022-07-10 DIAGNOSIS — I25118 Atherosclerotic heart disease of native coronary artery with other forms of angina pectoris: Secondary | ICD-10-CM | POA: Diagnosis present

## 2022-07-10 DIAGNOSIS — I10 Essential (primary) hypertension: Secondary | ICD-10-CM | POA: Diagnosis not present

## 2022-07-10 DIAGNOSIS — R079 Chest pain, unspecified: Secondary | ICD-10-CM | POA: Diagnosis not present

## 2022-07-10 LAB — ECHOCARDIOGRAM COMPLETE
Area-P 1/2: 3.42 cm2
S' Lateral: 2.8 cm

## 2022-07-13 ENCOUNTER — Ambulatory Visit (HOSPITAL_COMMUNITY): Payer: Medicare HMO

## 2022-07-21 ENCOUNTER — Ambulatory Visit: Payer: Medicare HMO | Admitting: Physician Assistant

## 2022-07-24 ENCOUNTER — Ambulatory Visit (HOSPITAL_COMMUNITY)
Admission: RE | Admit: 2022-07-24 | Discharge: 2022-07-24 | Disposition: A | Discharge status: Home / Self Care | Payer: Medicare HMO | Source: Ambulatory Visit | Attending: Physician Assistant | Admitting: Physician Assistant

## 2022-07-24 DIAGNOSIS — R079 Chest pain, unspecified: Secondary | ICD-10-CM | POA: Diagnosis not present

## 2022-07-24 DIAGNOSIS — I25118 Atherosclerotic heart disease of native coronary artery with other forms of angina pectoris: Secondary | ICD-10-CM | POA: Insufficient documentation

## 2022-07-24 DIAGNOSIS — I739 Peripheral vascular disease, unspecified: Secondary | ICD-10-CM

## 2022-07-24 DIAGNOSIS — I10 Essential (primary) hypertension: Secondary | ICD-10-CM | POA: Insufficient documentation

## 2022-07-27 ENCOUNTER — Telehealth: Payer: Self-pay | Admitting: Physician Assistant
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
## 2022-07-27 NOTE — Telephone Encounter (Signed)
Pt aware of test results and appt made with Dr Fletcher Anon for 08/11/22 at 8:40 am Pt's wife aware of appt at pt's request ./cy

## 2022-07-27 NOTE — Telephone Encounter (Signed)
Pt wife returning call for results

## 2022-07-28 ENCOUNTER — Encounter (HOSPITAL_COMMUNITY): Payer: Self-pay

## 2022-07-28 ENCOUNTER — Inpatient Hospital Stay (HOSPITAL_COMMUNITY)
Admission: EM | Disposition: A | Discharge status: Home / Self Care | Payer: Self-pay | Source: Home / Self Care | Attending: Cardiothoracic Surgery

## 2022-07-28 ENCOUNTER — Other Ambulatory Visit: Payer: Self-pay

## 2022-07-28 ENCOUNTER — Ambulatory Visit: Payer: Medicare HMO | Attending: Physician Assistant | Admitting: Physician Assistant

## 2022-07-28 ENCOUNTER — Ambulatory Visit: Payer: Medicare HMO | Admitting: Physician Assistant

## 2022-07-28 ENCOUNTER — Ambulatory Visit (HOSPITAL_COMMUNITY): Admit: 2022-07-28 | Payer: Medicare HMO | Admitting: Cardiology

## 2022-07-28 ENCOUNTER — Observation Stay (HOSPITAL_COMMUNITY): Payer: Medicare HMO

## 2022-07-28 ENCOUNTER — Encounter: Payer: Self-pay | Admitting: Physician Assistant

## 2022-07-28 ENCOUNTER — Inpatient Hospital Stay (HOSPITAL_COMMUNITY)
Admission: EM | Admit: 2022-07-28 | Discharge: 2022-08-06 | DRG: 234 | Disposition: A | Discharge status: Home / Self Care | Payer: Medicare HMO | Attending: Cardiothoracic Surgery | Admitting: Cardiothoracic Surgery

## 2022-07-28 VITALS — BP 126/82 | HR 65 | Ht 69.0 in | Wt 196.6 lb

## 2022-07-28 DIAGNOSIS — Z720 Tobacco use: Secondary | ICD-10-CM

## 2022-07-28 DIAGNOSIS — I25118 Atherosclerotic heart disease of native coronary artery with other forms of angina pectoris: Secondary | ICD-10-CM

## 2022-07-28 DIAGNOSIS — I70213 Atherosclerosis of native arteries of extremities with intermittent claudication, bilateral legs: Secondary | ICD-10-CM | POA: Diagnosis present

## 2022-07-28 DIAGNOSIS — M48061 Spinal stenosis, lumbar region without neurogenic claudication: Secondary | ICD-10-CM | POA: Insufficient documentation

## 2022-07-28 DIAGNOSIS — I200 Unstable angina: Secondary | ICD-10-CM | POA: Diagnosis not present

## 2022-07-28 DIAGNOSIS — J811 Chronic pulmonary edema: Secondary | ICD-10-CM | POA: Diagnosis present

## 2022-07-28 DIAGNOSIS — M545 Low back pain, unspecified: Secondary | ICD-10-CM | POA: Diagnosis present

## 2022-07-28 DIAGNOSIS — F17210 Nicotine dependence, cigarettes, uncomplicated: Secondary | ICD-10-CM | POA: Diagnosis present

## 2022-07-28 DIAGNOSIS — F1120 Opioid dependence, uncomplicated: Secondary | ICD-10-CM | POA: Diagnosis present

## 2022-07-28 DIAGNOSIS — R0603 Acute respiratory distress: Secondary | ICD-10-CM | POA: Diagnosis not present

## 2022-07-28 DIAGNOSIS — I214 Non-ST elevation (NSTEMI) myocardial infarction: Secondary | ICD-10-CM | POA: Diagnosis not present

## 2022-07-28 DIAGNOSIS — Z981 Arthrodesis status: Secondary | ICD-10-CM

## 2022-07-28 DIAGNOSIS — M1990 Unspecified osteoarthritis, unspecified site: Secondary | ICD-10-CM | POA: Diagnosis present

## 2022-07-28 DIAGNOSIS — I10 Essential (primary) hypertension: Secondary | ICD-10-CM | POA: Diagnosis present

## 2022-07-28 DIAGNOSIS — D62 Acute posthemorrhagic anemia: Secondary | ICD-10-CM | POA: Diagnosis not present

## 2022-07-28 DIAGNOSIS — I2510 Atherosclerotic heart disease of native coronary artery without angina pectoris: Secondary | ICD-10-CM

## 2022-07-28 DIAGNOSIS — I4891 Unspecified atrial fibrillation: Secondary | ICD-10-CM | POA: Diagnosis not present

## 2022-07-28 DIAGNOSIS — Z79890 Hormone replacement therapy (postmenopausal): Secondary | ICD-10-CM

## 2022-07-28 DIAGNOSIS — E039 Hypothyroidism, unspecified: Secondary | ICD-10-CM | POA: Diagnosis present

## 2022-07-28 DIAGNOSIS — K219 Gastro-esophageal reflux disease without esophagitis: Secondary | ICD-10-CM | POA: Diagnosis present

## 2022-07-28 DIAGNOSIS — Z8679 Personal history of other diseases of the circulatory system: Secondary | ICD-10-CM

## 2022-07-28 DIAGNOSIS — I708 Atherosclerosis of other arteries: Secondary | ICD-10-CM | POA: Diagnosis present

## 2022-07-28 DIAGNOSIS — I77810 Thoracic aortic ectasia: Secondary | ICD-10-CM | POA: Diagnosis present

## 2022-07-28 DIAGNOSIS — Z91138 Patient's unintentional underdosing of medication regimen for other reason: Secondary | ICD-10-CM

## 2022-07-28 DIAGNOSIS — I77819 Aortic ectasia, unspecified site: Secondary | ICD-10-CM

## 2022-07-28 DIAGNOSIS — M25579 Pain in unspecified ankle and joints of unspecified foot: Secondary | ICD-10-CM | POA: Insufficient documentation

## 2022-07-28 DIAGNOSIS — Z8673 Personal history of transient ischemic attack (TIA), and cerebral infarction without residual deficits: Secondary | ICD-10-CM

## 2022-07-28 DIAGNOSIS — R079 Chest pain, unspecified: Secondary | ICD-10-CM | POA: Diagnosis present

## 2022-07-28 DIAGNOSIS — M79606 Pain in leg, unspecified: Secondary | ICD-10-CM | POA: Diagnosis not present

## 2022-07-28 DIAGNOSIS — Z8719 Personal history of other diseases of the digestive system: Secondary | ICD-10-CM

## 2022-07-28 DIAGNOSIS — Z8701 Personal history of pneumonia (recurrent): Secondary | ICD-10-CM

## 2022-07-28 DIAGNOSIS — E782 Mixed hyperlipidemia: Secondary | ICD-10-CM | POA: Diagnosis present

## 2022-07-28 DIAGNOSIS — Z8249 Family history of ischemic heart disease and other diseases of the circulatory system: Secondary | ICD-10-CM

## 2022-07-28 DIAGNOSIS — E8770 Fluid overload, unspecified: Secondary | ICD-10-CM | POA: Diagnosis not present

## 2022-07-28 DIAGNOSIS — E785 Hyperlipidemia, unspecified: Secondary | ICD-10-CM | POA: Diagnosis not present

## 2022-07-28 DIAGNOSIS — I252 Old myocardial infarction: Secondary | ICD-10-CM

## 2022-07-28 DIAGNOSIS — E872 Acidosis, unspecified: Secondary | ICD-10-CM | POA: Diagnosis not present

## 2022-07-28 DIAGNOSIS — J209 Acute bronchitis, unspecified: Secondary | ICD-10-CM | POA: Diagnosis not present

## 2022-07-28 DIAGNOSIS — Z951 Presence of aortocoronary bypass graft: Secondary | ICD-10-CM

## 2022-07-28 DIAGNOSIS — I25110 Atherosclerotic heart disease of native coronary artery with unstable angina pectoris: Secondary | ICD-10-CM | POA: Diagnosis not present

## 2022-07-28 DIAGNOSIS — Z79899 Other long term (current) drug therapy: Secondary | ICD-10-CM

## 2022-07-28 DIAGNOSIS — T39016A Underdosing of aspirin, initial encounter: Secondary | ICD-10-CM | POA: Diagnosis present

## 2022-07-28 DIAGNOSIS — I739 Peripheral vascular disease, unspecified: Secondary | ICD-10-CM

## 2022-07-28 DIAGNOSIS — E876 Hypokalemia: Secondary | ICD-10-CM | POA: Diagnosis not present

## 2022-07-28 DIAGNOSIS — N179 Acute kidney failure, unspecified: Secondary | ICD-10-CM | POA: Diagnosis not present

## 2022-07-28 DIAGNOSIS — G8929 Other chronic pain: Secondary | ICD-10-CM | POA: Diagnosis present

## 2022-07-28 DIAGNOSIS — Z7982 Long term (current) use of aspirin: Secondary | ICD-10-CM

## 2022-07-28 DIAGNOSIS — D696 Thrombocytopenia, unspecified: Secondary | ICD-10-CM | POA: Diagnosis not present

## 2022-07-28 DIAGNOSIS — M549 Dorsalgia, unspecified: Secondary | ICD-10-CM | POA: Diagnosis not present

## 2022-07-28 DIAGNOSIS — J440 Chronic obstructive pulmonary disease with acute lower respiratory infection: Secondary | ICD-10-CM | POA: Diagnosis present

## 2022-07-28 HISTORY — PX: LEFT HEART CATH AND CORONARY ANGIOGRAPHY: CATH118249

## 2022-07-28 LAB — TROPONIN I (HIGH SENSITIVITY)
Troponin I (High Sensitivity): 3095 ng/L (ref <18)
Troponin I (High Sensitivity): 3052 ng/L (ref <18)

## 2022-07-28 LAB — BASIC METABOLIC PANEL
Anion gap: 14 (ref 5–15)
BUN: 12 mg/dL (ref 8–23)
CO2: 22 mmol/L (ref 22–32)
Calcium: 9.5 mg/dL (ref 8.9–10.3)
Chloride: 101 mmol/L (ref 98–111)
Creatinine, Ser: 1.06 mg/dL (ref 0.61–1.24)
GFR, Estimated: >60 mL/min (ref >60)
Glucose, Bld: 91 mg/dL (ref 70–99)
Potassium: 3.3 mmol/L — ABNORMAL LOW (ref 3.5–5.1)
Sodium: 137 mmol/L (ref 135–145)

## 2022-07-28 LAB — CBC
HCT: 39 % (ref 39.0–52.0)
Hemoglobin: 13.3 g/dL (ref 13.0–17.0)
MCH: 31.1 pg (ref 26.0–34.0)
MCHC: 34.1 g/dL (ref 30.0–36.0)
MCV: 91.3 fL (ref 80.0–100.0)
Platelets: 206 10*3/uL (ref 150–400)
RBC: 4.27 MIL/uL (ref 4.22–5.81)
RDW: 12.4 % (ref 11.5–15.5)
WBC: 6 10*3/uL (ref 4.0–10.5)
nRBC: 0 % (ref 0.0–0.2)

## 2022-07-28 LAB — HEMOGLOBIN A1C
Hgb A1c MFr Bld: 5.3 % (ref 4.8–5.6)
Mean Plasma Glucose: 105.41 mg/dL

## 2022-07-28 LAB — TSH: TSH: 0.572 u[IU]/mL (ref 0.350–4.500)

## 2022-07-28 LAB — HIV ANTIBODY (ROUTINE TESTING W REFLEX): HIV Screen 4th Generation wRfx: NONREACTIVE

## 2022-07-28 SURGERY — LEFT HEART CATH AND CORONARY ANGIOGRAPHY
Anesthesia: LOCAL

## 2022-07-28 MED ORDER — NITROGLYCERIN 1 MG/10 ML FOR IR/CATH LAB
INTRA_ARTERIAL | Status: AC
  Filled 2022-07-28: qty 10

## 2022-07-28 MED ORDER — ASPIRIN 325 MG PO TABS: 325.0000 mg | ORAL_TABLET | Freq: Once | ORAL | Status: DC

## 2022-07-28 MED ORDER — VERAPAMIL HCL 2.5 MG/ML IV SOLN
INTRAVENOUS | Status: DC | PRN
  Administered 2022-07-28: 10 mL via INTRA_ARTERIAL

## 2022-07-28 MED ORDER — ACETAMINOPHEN 325 MG PO TABS: 650.0000 mg | ORAL_TABLET | ORAL | Status: DC | PRN

## 2022-07-28 MED ORDER — ROSUVASTATIN CALCIUM 20 MG PO TABS
40.0000 mg | ORAL_TABLET | Freq: Every morning | ORAL | Status: DC
  Administered 2022-07-29 – 2022-08-06 (×8): 40 mg via ORAL
  Filled 2022-07-28 (×9): qty 2

## 2022-07-28 MED ORDER — SODIUM CHLORIDE 0.9 % WEIGHT BASED INFUSION: 1.0000 mL/kg/h | INTRAVENOUS | Status: DC

## 2022-07-28 MED ORDER — SODIUM CHLORIDE 0.9 % IV SOLN
INTRAVENOUS | Status: AC | PRN
  Administered 2022-07-28: 50 mL/h via INTRAVENOUS

## 2022-07-28 MED ORDER — ONDANSETRON HCL 4 MG/2ML IJ SOLN: 4.0000 mg | Freq: Four times a day (QID) | INTRAMUSCULAR | Status: DC | PRN

## 2022-07-28 MED ORDER — SODIUM CHLORIDE 0.9% FLUSH
3.0000 mL | Freq: Two times a day (BID) | INTRAVENOUS | Status: DC
  Administered 2022-07-29 (×2): 3 mL via INTRAVENOUS

## 2022-07-28 MED ORDER — HYDROCODONE-ACETAMINOPHEN 5-325 MG PO TABS
ORAL_TABLET | ORAL | Status: AC
  Filled 2022-07-28: qty 1

## 2022-07-28 MED ORDER — SODIUM CHLORIDE 0.9% FLUSH: 3.0000 mL | INTRAVENOUS | Status: DC | PRN

## 2022-07-28 MED ORDER — SODIUM CHLORIDE 0.9 % WEIGHT BASED INFUSION: 3.0000 mL/kg/h | INTRAVENOUS | Status: DC

## 2022-07-28 MED ORDER — ASPIRIN 81 MG PO CHEW: 81.0000 mg | CHEWABLE_TABLET | ORAL | Status: DC

## 2022-07-28 MED ORDER — ASPIRIN 81 MG PO TBEC
81.0000 mg | DELAYED_RELEASE_TABLET | Freq: Every day | ORAL | Status: DC
  Administered 2022-07-29: 81 mg via ORAL
  Filled 2022-07-28: qty 1

## 2022-07-28 MED ORDER — IOHEXOL 350 MG/ML SOLN
INTRAVENOUS | Status: DC | PRN
  Administered 2022-07-28: 35 mL

## 2022-07-28 MED ORDER — POTASSIUM CHLORIDE CRYS ER 20 MEQ PO TBCR: 40.0000 meq | EXTENDED_RELEASE_TABLET | Freq: Once | ORAL | Status: DC

## 2022-07-28 MED ORDER — ISOSORBIDE MONONITRATE ER 30 MG PO TB24: 30.0000 mg | ORAL_TABLET | Freq: Every day | ORAL | Status: DC

## 2022-07-28 MED ORDER — HEPARIN (PORCINE) 25000 UT/250ML-% IV SOLN
1700.0000 [IU]/h | INTRAVENOUS | Status: DC
  Administered 2022-07-28: 1200 [IU]/h via INTRAVENOUS
  Administered 2022-07-29: 1500 [IU]/h via INTRAVENOUS
  Filled 2022-07-28 (×2): qty 250

## 2022-07-28 MED ORDER — HEPARIN (PORCINE) IN NACL 1000-0.9 UT/500ML-% IV SOLN
INTRAVENOUS | Status: AC
  Filled 2022-07-28: qty 1000

## 2022-07-28 MED ORDER — VERAPAMIL HCL 2.5 MG/ML IV SOLN
INTRAVENOUS | Status: AC
  Filled 2022-07-28: qty 2

## 2022-07-28 MED ORDER — LIDOCAINE HCL (PF) 1 % IJ SOLN
INTRAMUSCULAR | Status: DC | PRN
  Administered 2022-07-28: 2 mL

## 2022-07-28 MED ORDER — LABETALOL HCL 5 MG/ML IV SOLN: 10.0000 mg | INTRAVENOUS | Status: AC | PRN

## 2022-07-28 MED ORDER — SODIUM CHLORIDE 0.9 % WEIGHT BASED INFUSION
1.0000 mL/kg/h | INTRAVENOUS | Status: DC
  Administered 2022-07-28: 1 mL/kg/h via INTRAVENOUS

## 2022-07-28 MED ORDER — NITROGLYCERIN 0.4 MG SL SUBL: 0.4000 mg | SUBLINGUAL_TABLET | SUBLINGUAL | Status: DC | PRN

## 2022-07-28 MED ORDER — IRBESARTAN 150 MG PO TABS
150.0000 mg | ORAL_TABLET | Freq: Every day | ORAL | Status: DC
  Administered 2022-07-29: 150 mg via ORAL
  Filled 2022-07-28: qty 1

## 2022-07-28 MED ORDER — LEVOTHYROXINE SODIUM 25 MCG PO TABS
125.0000 ug | ORAL_TABLET | Freq: Every day | ORAL | Status: DC
  Administered 2022-07-29 – 2022-08-06 (×8): 125 ug via ORAL
  Filled 2022-07-28 (×8): qty 1

## 2022-07-28 MED ORDER — HYDRALAZINE HCL 20 MG/ML IJ SOLN: 10.0000 mg | INTRAMUSCULAR | Status: AC | PRN

## 2022-07-28 MED ORDER — MIDAZOLAM HCL 2 MG/2ML IJ SOLN
INTRAMUSCULAR | Status: DC | PRN
  Administered 2022-07-28: 1 mg via INTRAVENOUS

## 2022-07-28 MED ORDER — LIDOCAINE HCL (PF) 1 % IJ SOLN
INTRAMUSCULAR | Status: AC
  Filled 2022-07-28: qty 30

## 2022-07-28 MED ORDER — MORPHINE SULFATE (PF) 2 MG/ML IV SOLN: 2.0000 mg | INTRAVENOUS | Status: DC | PRN

## 2022-07-28 MED ORDER — AMLODIPINE BESYLATE 5 MG PO TABS
5.0000 mg | ORAL_TABLET | Freq: Every day | ORAL | Status: DC
  Administered 2022-07-29: 5 mg via ORAL
  Filled 2022-07-28: qty 1

## 2022-07-28 MED ORDER — HEPARIN SODIUM (PORCINE) 1000 UNIT/ML IJ SOLN
INTRAMUSCULAR | Status: DC | PRN
  Administered 2022-07-28: 4500 [IU] via INTRAVENOUS

## 2022-07-28 MED ORDER — HEPARIN SODIUM (PORCINE) 1000 UNIT/ML IJ SOLN
INTRAMUSCULAR | Status: AC
  Filled 2022-07-28: qty 10

## 2022-07-28 MED ORDER — MIDAZOLAM HCL 2 MG/2ML IJ SOLN
INTRAMUSCULAR | Status: AC
  Filled 2022-07-28: qty 2

## 2022-07-28 MED ORDER — POTASSIUM CHLORIDE CRYS ER 20 MEQ PO TBCR: 40.0000 meq | EXTENDED_RELEASE_TABLET | ORAL | Status: DC

## 2022-07-28 MED ORDER — HYDROCODONE-ACETAMINOPHEN 5-325 MG PO TABS
1.0000 | ORAL_TABLET | Freq: Four times a day (QID) | ORAL | Status: DC | PRN
  Administered 2022-07-28 – 2022-07-29 (×4): 1 via ORAL
  Filled 2022-07-28 (×3): qty 1

## 2022-07-28 MED ORDER — PANTOPRAZOLE SODIUM 40 MG PO TBEC
80.0000 mg | DELAYED_RELEASE_TABLET | Freq: Every day | ORAL | Status: DC
  Administered 2022-07-29: 80 mg via ORAL
  Filled 2022-07-28: qty 2

## 2022-07-28 MED ORDER — POTASSIUM CHLORIDE 20 MEQ PO PACK
PACK | ORAL | Status: DC | PRN
  Administered 2022-07-28: 40 meq via ORAL

## 2022-07-28 MED ORDER — FENTANYL CITRATE (PF) 100 MCG/2ML IJ SOLN
INTRAMUSCULAR | Status: DC | PRN
  Administered 2022-07-28: 25 ug via INTRAVENOUS

## 2022-07-28 MED ORDER — SODIUM CHLORIDE 0.9 % IV SOLN: 250.0000 mL | INTRAVENOUS | Status: DC | PRN

## 2022-07-28 MED ORDER — FENTANYL CITRATE (PF) 100 MCG/2ML IJ SOLN
INTRAMUSCULAR | Status: AC
  Filled 2022-07-28: qty 2

## 2022-07-28 MED ORDER — POTASSIUM CHLORIDE CRYS ER 20 MEQ PO TBCR
EXTENDED_RELEASE_TABLET | ORAL | Status: AC
  Filled 2022-07-28: qty 2

## 2022-07-28 MED ORDER — FENOFIBRATE 160 MG PO TABS
160.0000 mg | ORAL_TABLET | Freq: Every day | ORAL | Status: DC
  Administered 2022-07-29 – 2022-08-06 (×8): 160 mg via ORAL
  Filled 2022-07-28 (×9): qty 1

## 2022-07-28 SURGICAL SUPPLY — 11 items
BAND CMPR LRG ZPHR (HEMOSTASIS) ×1
BAND ZEPHYR COMPRESS 30 LONG (HEMOSTASIS) IMPLANT
CATH OPTITORQUE TIG 4.0 5F (CATHETERS) IMPLANT
GLIDESHEATH SLEND SS 6F .021 (SHEATH) IMPLANT
GUIDEWIRE INQWIRE 1.5J.035X260 (WIRE) IMPLANT
INQWIRE 1.5J .035X260CM (WIRE) ×1
KIT HEART LEFT (KITS) ×1 IMPLANT
PACK CARDIAC CATHETERIZATION (CUSTOM PROCEDURE TRAY) ×1 IMPLANT
SHEATH PROBE COVER 6X72 (BAG) IMPLANT
TRANSDUCER W/STOPCOCK (MISCELLANEOUS) ×1 IMPLANT
TUBING CIL FLEX 10 FLL-RA (TUBING) ×1 IMPLANT

## 2022-07-28 NOTE — Interval H&P Note (Signed)
History and Physical Interval Note:  07/28/2022 3:05 PM  Carl [name]  has presented today for surgery, with the diagnosis of nstemi.  The various methods of treatment have been discussed with the patient and family. After consideration of risks, benefits and other options for treatment, the patient has consented to  Procedure(s): LEFT HEART CATH AND CORONARY ANGIOGRAPHY ([name])  PERCUTANEOUS CORONARY INTERVENTION  as a surgical intervention.  The patient's history has been reviewed, patient examined, no change in status, stable for surgery.  I have reviewed the patient's chart and labs.  Questions were answered to the patient's satisfaction.    Cath Lab Visit (complete for each Cath Lab visit)  Clinical Evaluation Leading to the Procedure:   ACS: Yes.    Non-ACS:    Anginal Classification: CCS IV  Anti-ischemic medical therapy: Maximal Therapy (2 or more classes of medications)  Non-Invasive Test Results: Low-risk stress test findings: cardiac mortality <1%/year  Prior CABG: No previous CABG    Glenetta Hew

## 2022-07-28 NOTE — ED Provider Triage Note (Signed)
Emergency Medicine Provider Triage Evaluation Note  Carl [name] , a 66 y.o. male  was evaluated in triage.  Pt complains of chest pain.  Is been intermittent for the last 4 months but worsened today.  Has been constant today with radiation into his jaw and his neck.  He took aspirin and nitro, the pain is resolved.  He is unsure if the nitro made it better.  He took 4 nitros actually prior to arrival..  No nausea or vomiting, history of previous MI.  Review of Systems  Per HPI  Physical Exam  BP (!) 141/85 (BP Location: Right Arm)   Pulse 60   Temp 98.2 F (36.8 C) (Oral)   Resp 16   Ht '5\' 9"'$  (1.753 m)   Wt 88.9 kg   SpO2 95%   BMI 28.94 kg/m  Gen:   Awake, no distress   Resp:  Normal effort  MSK:   Moves extremities without difficulty  Other:  S1-S2  Medical Decision Making  Medically screening exam initiated at 12:59 PM.  Appropriate orders placed.  Carl [name] was informed that the remainder of the evaluation will be completed by another provider, this initial triage assessment does not replace that evaluation, and the importance of remaining in the ED until their evaluation is complete.     Carl Raring, PA-C 07/28/22 1300

## 2022-07-28 NOTE — ED Provider Notes (Signed)
[facility] EMERGENCY DEPARTMENT Provider Note   CSN: [id number] Arrival date & time: 07/28/22  1247     History  Chief Complaint  Patient presents with   Chest Pain    Carl [name] is a 66 y.o. male.   Chest Pain    Patient with medical history of previous CVA, dyslipidemia hypertension, unstable angina presenting today due to 4 months of angina and worsening symptoms today.  Had 4 nitroglycerin prior to arrival which helped with the chest pain this morning.  Also given 324 aspirin.  Cardiology requested patient go to the ED for catheterization.   Home Medications Prior to Admission medications   Medication Sig Start Date End Date Taking? Authorizing Provider  amLODipine (NORVASC) 5 MG tablet Take 1 tablet (5 mg total) by mouth daily. 07/01/22   Jettie Booze, MD  aspirin 81 MG EC tablet Take 81 mg by mouth daily.    [provider]  diazepam (VALIUM) 5 MG tablet Take 1-2 tablets (5-10 mg total) by mouth every 6 (six) hours as needed for muscle spasms. 01/24/20   Earnie Larsson, MD  fenofibrate (TRICOR) 145 MG tablet Take 145 mg by mouth daily.     [provider]  HYDROcodone-acetaminophen (NORCO/VICODIN) 5-325 MG tablet Take 1 tablet by mouth every 6 (six) hours as needed for moderate pain or severe pain. 07/13/22   [provider]  ibuprofen (ADVIL) 200 MG tablet Take 400 mg by mouth every 6 (six) hours as needed for headache or moderate pain.    [provider]  isosorbide mononitrate (IMDUR) 30 MG 24 hr tablet Take 1 tablet (30 mg total) by mouth daily. 06/24/22 06/25/23  Imogene Burn, PA-C  levothyroxine (SYNTHROID) 125 MCG tablet Take 125 mcg by mouth daily. 07/15/22   [provider]  lisinopril (ZESTRIL) 20 MG tablet Take 1 tablet (20 mg total) by mouth every morning. 01/15/20   Jettie Booze, MD  nitroGLYCERIN (NITROSTAT) 0.4 MG SL tablet Place 1 tablet (0.4 mg total) under the tongue every 5  (five) minutes as needed for chest pain. 06/01/14   Jettie Booze, MD  omeprazole (PRILOSEC) 40 MG capsule Take 40 mg by mouth daily.    [provider]  oxyCODONE 10 MG TABS Take 1 tablet (10 mg total) by mouth every 3 (three) hours as needed for severe pain ((score 7 to 10)). 01/24/20   Earnie Larsson, MD  rosuvastatin (CRESTOR) 40 MG tablet Take 40 mg by mouth every morning.     [provider]  valsartan (DIOVAN) 160 MG tablet Take 160 mg by mouth daily. 07/14/22   [provider]      Allergies    Patient has no known allergies.    Review of Systems   Review of Systems  Cardiovascular:  Positive for chest pain.    Physical Exam Updated Vital Signs BP 137/87 (BP Location: Left Arm)   Pulse (!) 53   Temp 98.2 F (36.8 C) (Oral)   Resp 18   Ht '5\' 9"'$  (1.753 m)   Wt 88.9 kg   SpO2 97%   BMI 28.94 kg/m  Physical Exam Vitals and nursing note reviewed. Exam conducted with a chaperone present.  Constitutional:      Appearance: Normal appearance.  HENT:     Head: Normocephalic and atraumatic.  Eyes:     General: No scleral icterus.       Right eye: No discharge.  Left eye: No discharge.     Extraocular Movements: Extraocular movements intact.     Pupils: Pupils are equal, round, and reactive to light.  Cardiovascular:     Rate and Rhythm: Normal rate and regular rhythm.     Pulses: Normal pulses.     Heart sounds: Normal heart sounds. No murmur heard.    No friction rub. No gallop.  Pulmonary:     Effort: Pulmonary effort is normal. No respiratory distress.     Breath sounds: Normal breath sounds.  Abdominal:     General: Abdomen is flat. Bowel sounds are normal. There is no distension.     Palpations: Abdomen is soft.     Tenderness: There is no abdominal tenderness.  Skin:    General: Skin is warm and dry.     Coloration: Skin is not jaundiced.  Neurological:     Mental Status: He is alert. Mental status is at baseline.      Coordination: Coordination normal.     ED Results / Procedures / Treatments   Labs (all labs ordered are listed, but only abnormal results are displayed) Labs Reviewed  BASIC METABOLIC PANEL - Abnormal; Notable for the following components:      Result Value   Potassium 3.3 (*)    All other components within normal limits  TROPONIN I (HIGH SENSITIVITY) - Abnormal; Notable for the following components:   Troponin I (High Sensitivity) 3,095 (*)    All other components within normal limits  CBC  HIV ANTIBODY (ROUTINE TESTING W REFLEX)  TSH  HEMOGLOBIN A1C  LIPOPROTEIN A (LPA)  BASIC METABOLIC PANEL  CBC  LIPID PANEL  TROPONIN I (HIGH SENSITIVITY)    EKG None  Radiology CARDIAC CATHETERIZATION  Result Date: 07/28/2022   Prox RCA to Mid RCA lesion is 65% stenosed.   Mid RCA lesion is 90% stenosed.  Irregular, eccentric calcified   Dist RCA lesion is 95% stenosed. ->  Almost channel due to eccentric location   Mid LAD-1 lesion is 90% stenosed with 30% stenosed side branch in 1st Diag.  Heavily calcified   Mid LAD-2 lesion is 80% stenosed.  Heavily calcified   Dist LAD lesion is 90% stenosed.  Heavily calcified   1st Diag lesion is 70% stenosed.   ----------------------------------   LV end diastolic pressure is mildly elevated.   There is no aortic valve stenosis. POST-OPERATIVE DIAGNOSIS:  Severe heavily calcified two-vessel CAD: LAD 90% eccentric/irregular stenosis at major D1 branch that has diffuse disease as well.  This lesion is then followed by tandem 80% and 90% eccentric calcified lesions.  Relatively long segment of diffuse disease heavily calcified; downstream LAD beyond this lesion is relatively free of disease. Highly calcified RCA with mid segment smooth heavily calcified eccentric 60 to 70% stenosis followed by a 90% irregular almost ulcerated calcified plaque just at the been followed by a 99% very eccentric lesion just prior to the bifurcation into the PDA and PL.  The  downstream vessels are relatively free of disease. Relatively normal LCx with a major lateral OM 1, relatively small OM 2 and small LPL 1. Mildly elevated LVEDP; normal EF by Echo earlier this month. PLAN OF CARE: Admit to inpatient  -> restart IV heparin 2 hours after zephyr band removal; will call CVTS to discuss CABG options with the knowledge that percutaneous options are still available.  However talking with Dr. Irish Lack the patient has not been very compliant with medications and therefore would not be favorable  for extensive atherectomy PCI with multiple stents (potentially even bifurcation) if he is not to be compliant with taking his medications. He will need titration of his antianginals, statin and glycemic control.  Remainder the plan per H&P from Melina Copa, [place].   DG Chest 2 View  Result Date: 07/28/2022 CLINICAL DATA:  Chest pain EXAM: CHEST - 2 VIEW COMPARISON:  Chest x-ray dated December 24, 2016 FINDINGS: The heart size and mediastinal contours are within normal limits. Both lungs are clear. The visualized skeletal structures are unremarkable. IMPRESSION: No active cardiopulmonary disease. Electronically Signed   By: Yetta Glassman M.D.   On: 07/28/2022 13:26    Procedures .Critical Care  Performed by: Sherrill Raring, PA-C Authorized by: Sherrill Raring, PA-C   Critical care provider statement:    Critical care time (minutes):  30   Critical care start time:  07/28/2022 1:00 PM   Critical care end time:  07/28/2022 1:49 PM   Critical care time was exclusive of:  Separately billable procedures and treating other patients   Critical care was necessary to treat or prevent imminent or life-threatening deterioration of the following conditions:  Cardiac failure   Critical care was time spent personally by me on the following activities:  Development of treatment plan with patient or surrogate, discussions with consultants, evaluation of patient's response to treatment, examination of patient,  ordering and review of laboratory studies, ordering and review of radiographic studies, ordering and performing treatments and interventions, pulse oximetry, re-evaluation of patient's condition and review of old charts   I assumed direction of critical care for this patient from another provider in my specialty: no     Care discussed with: admitting provider       Medications Ordered in ED Medications  aspirin EC tablet 81 mg ( Oral MAR Unhold 07/28/22 1710)  nitroGLYCERIN (NITROSTAT) SL tablet 0.4 mg ( Sublingual MAR Unhold 07/28/22 1710)  acetaminophen (TYLENOL) tablet 650 mg ( Oral MAR Unhold 07/28/22 1710)  ondansetron (ZOFRAN) injection 4 mg ( Intravenous MAR Unhold 07/28/22 1710)  amLODipine (NORVASC) tablet 5 mg ( Oral MAR Unhold 07/28/22 1710)  fenofibrate tablet 160 mg ( Oral MAR Unhold 07/28/22 1710)  HYDROcodone-acetaminophen (NORCO/VICODIN) 5-325 MG per tablet 1 tablet ( Oral MAR Unhold 07/28/22 1710)  levothyroxine (SYNTHROID) tablet 125 mcg ( Oral MAR Unhold 07/28/22 1710)  pantoprazole (PROTONIX) EC tablet 80 mg ( Oral MAR Unhold 07/28/22 1710)  rosuvastatin (CRESTOR) tablet 40 mg ( Oral MAR Unhold 07/28/22 1710)  irbesartan (AVAPRO) tablet 150 mg ( Oral MAR Unhold 07/28/22 1710)  potassium chloride SA (KLOR-CON M) CR tablet 40 mEq ( Oral MAR Unhold 07/28/22 1710)  labetalol (NORMODYNE) injection 10 mg (has no administration in time range)  hydrALAZINE (APRESOLINE) injection 10 mg (has no administration in time range)  0.9% sodium chloride infusion (1 mL/kg/hr  88.9 kg Intravenous New Bag/Given 07/28/22 1641)  sodium chloride flush (NS) 0.9 % injection 3 mL (has no administration in time range)  sodium chloride flush (NS) 0.9 % injection 3 mL (has no administration in time range)  0.9 %  sodium chloride infusion (has no administration in time range)  morphine (PF) 2 MG/ML injection 2 mg (has no administration in time range)  0.9 %  sodium chloride infusion (20 mL/hr Intravenous  Rate/Dose Change 07/28/22 1545)    ED Course/ Medical Decision Making/ A&P  Medical Decision Making Amount and/or Complexity of Data Reviewed Radiology: ordered.  Risk Decision regarding hospitalization.   Patient presents to the emergency department due to chest pain.  Aspirin and nitro given.  Physical exam is not particularly revealing.  He is a regular rhythm, does appear somewhat uncomfortable.  Not hypoxic, no tachypnea.  I ordered, viewed and interpreted laboratory work-up. CBC with no leukocytosis or anemia. Troponin is elevated at 3095. BMP with hypokalemia 3.3  EKG shows nonspecific changes.  I ordered and viewed chest x-ray.  No acute process.  Agree with radiologist dictation.  I consult with cardiology who are direct admitting the patient and proceeding with cardiac catheterization.  Medications ordered as requested by cardiology.        Final Clinical Impression(s) / ED Diagnoses Final diagnoses:  None    Rx / DC Orders ED Discharge Orders     None         Sherrill Raring, PA-C 07/28/22 1749    Long, Wonda Olds, MD 08/04/22 [phone]

## 2022-07-28 NOTE — ED Notes (Signed)
BLOOD HAS BEEN COLLECTED........ENTIRE RAINBOW

## 2022-07-28 NOTE — ED Triage Notes (Signed)
Coming fromMD off for chest pain and is suppose to go to cath lab. 3 months has had unstable angina.  Recent stress test was abnormal.  Took 4 nitro to get rid of chest pain this am.  MD gave '324mg'$  ASA.  No chest pain at this time.  128/82 HR54 96% RA  18g LFA

## 2022-07-28 NOTE — Patient Instructions (Signed)
YOU HAVE BEEN RECOMMENDED TO GOT TO THE EMERGENCY DEPARTMENT FOR YOUR UNSTABLE CHEST PAIN

## 2022-07-28 NOTE — Brief Op Note (Addendum)
BRIEF CARDIAC CATHETERIZATION REPORT  07/28/2022 4:19 PM  CP:  Vicenta Aly, FNP            Cardiologist:  Larae Grooms, MD ; Melina Copa, PA-C  PROCEDURE:  Procedure(s): LEFT HEART CATH AND CORONARY ANGIOGRAPHY ([name])  SURGEON:  Surgeon(s) and Role:    * Leonie Man, MD - Primary  PATIENT:  Carl [name] is a 66 y.o. male with a prior history of nonobstructive CAD by cath in 2011 was long-term smoker (having reduced from 2 PPD to 1 PPD) with HTN and HLD and suspected PAD status post stroke.  He was seen by Estella Husk, PA on 06/24/2022 with complaints of intermittent chest pain.  She provided a prescription for sublingual nitroglycerin along with Imdur.  Myoview on 07/01/2022 was read as low risk without ischemia.  Echo 07/10/2022 showed EF of 60 to 65% with GR 1 DD and normal RV function.  Borderline ascending aortic dilation.  He has been referred to Dr. Fletcher Anon for PV evaluation.  He was just seen today by Melina Copa, PA followed by Dr. Irish Lack as attending with 4 months of progressive chest pain with any physical exertion.  This is associated with an uneasy feeling in his face, but no real clamminess etc.  The symptoms are relieved with nitroglycerin.  He had a prolonged episode on the morning of 07/28/2022 which led him to come in for this visit.  This concerning for possible ACS.  He opted out of calling EMS and chose to come for the working visit.  He was given 324 mg in the clinic and taken to the ER where he actually ruled in for non-STEMI with a troponin of over 3000.  Now presents for evaluation of heart catheterization possible PCI.  PRE-OPERATIVE DIAGNOSIS:  nstemi  POST-OPERATIVE DIAGNOSIS:   Severe heavily calcified two-vessel CAD:  LAD 90% eccentric/irregular stenosis at major D1 branch that has diffuse disease as well.  This lesion is then followed by tandem 80% and 90% eccentric calcified lesions.  Relatively long segment of diffuse disease heavily calcified;  downstream LAD beyond this lesion is relatively free of disease. Highly calcified RCA with mid segment smooth heavily calcified eccentric 60 to 70% stenosis followed by a 90% irregular almost ulcerated calcified plaque just at the been followed by a 99% very eccentric lesion just prior to the bifurcation into the PDA and PL.  The downstream vessels are relatively free of disease. Relatively normal LCx with a major lateral OM 1, relatively small OM 2 and small LPL 1. Mildly elevated LVEDP, with normal EF by Echo earlier this month   PROCEDURE PERFORMED: Time Out: Verified patient identification, verified procedure, site/side was marked, verified correct patient position, special equipment/implants available, medications/allergies/relevent history reviewed, required imaging and test results available. Performed.  Access:  RIGHT Radial Artery: 6 Fr sheath -- Seldinger technique using Micropuncture Kit -- Direct ultrasound guidance used.  Permanent image obtained and placed on chart. -- 10 mL radial cocktail IA; 4500 Units IV Heparin  Left Heart Catheterization: 5 Fr TIG 4.0 Catheter was advanced or exchanged over a J-wire under direct fluoroscopic guidance into the ascending aorta; the catheter was advanced advanced across the aortic valve into the Left Ventricle for hemodynamics first.  The catheter was then pulled back across the valve for measurement of pullback gradient and then used to engage first the Right than the Left Coronary Arteries for selective coronary cineangiography.   Review of initial angiography revealed: Severe two-vessel  disease-reviewed with Dr. Angelena Form, decision made to stop at this point to determine plan of action either extensive two-vessel atherectomy PCI versus potential CABG.  Preparations are made for admission to allow for heart team discussion.  Upon completion of Angiogaphy, the catheter was removed completely out of the body over a wire, without  complication.  Radial sheath removed in the Cardiac Catheterization lab with Zephyr band placed for hemostasis.  MEDICATIONS SQ Lidocaine for mL Radial Cocktail: 3 mg Verapmil in 10 mL NS Heparin: 4500 units 65 mL contrast   ANESTHESIA:   local and IV sedation  EBL:  < 20 mL    COUNTS:  YES  DICTATION: .Note written in EPIC  PLAN OF CARE: Admit to inpatient  -> restart IV heparin 2 hours after zephyr band removal; will call CVTS to discuss CABG options with the knowledge that percutaneous options are still available.  However talking with Dr. Irish Lack the patient has not been very compliant with medications and therefore would not be favorable for extensive atherectomy PCI with multiple stents (potentially even bifurcation) if he is not to be compliant with taking his medications. He will need titration of his antianginals, statin and glycemic control.  Remainder the plan per H&P from Melina Copa, [place].  PATIENT DISPOSITION:  PACU - hemodynamically stable.   Delay start of Pharmacological VTE agent (>24hrs) due to surgical blood loss or risk of bleeding: not applicable    Glenetta Hew, MD
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
## 2022-07-28 NOTE — Progress Notes (Addendum)
Labs are now back.K 3.3 -> will give 54mq KCL now. Initial troponin 3,095 c/w NSTEMI. CBC OK. Cath lab calling for patient momentarily so held off heparin bolus per d/w cath team. If there is any delay getting the patient to the cath lab OK to start per Dr. VIrish Lack but cath lab nurse confirmed they are calling for him now.
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
## 2022-07-29 ENCOUNTER — Observation Stay (HOSPITAL_COMMUNITY): Payer: Medicare HMO

## 2022-07-29 ENCOUNTER — Ambulatory Visit: Payer: Medicare HMO | Admitting: Podiatry

## 2022-07-29 ENCOUNTER — Encounter (HOSPITAL_COMMUNITY): Payer: Self-pay | Admitting: Cardiology

## 2022-07-29 DIAGNOSIS — R079 Chest pain, unspecified: Secondary | ICD-10-CM | POA: Diagnosis present

## 2022-07-29 DIAGNOSIS — F1120 Opioid dependence, uncomplicated: Secondary | ICD-10-CM | POA: Diagnosis present

## 2022-07-29 DIAGNOSIS — Z79899 Other long term (current) drug therapy: Secondary | ICD-10-CM | POA: Diagnosis not present

## 2022-07-29 DIAGNOSIS — Z8701 Personal history of pneumonia (recurrent): Secondary | ICD-10-CM | POA: Diagnosis not present

## 2022-07-29 DIAGNOSIS — I739 Peripheral vascular disease, unspecified: Secondary | ICD-10-CM

## 2022-07-29 DIAGNOSIS — D696 Thrombocytopenia, unspecified: Secondary | ICD-10-CM | POA: Diagnosis not present

## 2022-07-29 DIAGNOSIS — E782 Mixed hyperlipidemia: Secondary | ICD-10-CM | POA: Diagnosis present

## 2022-07-29 DIAGNOSIS — I10 Essential (primary) hypertension: Secondary | ICD-10-CM | POA: Diagnosis present

## 2022-07-29 DIAGNOSIS — Z8673 Personal history of transient ischemic attack (TIA), and cerebral infarction without residual deficits: Secondary | ICD-10-CM | POA: Diagnosis not present

## 2022-07-29 DIAGNOSIS — F17210 Nicotine dependence, cigarettes, uncomplicated: Secondary | ICD-10-CM | POA: Diagnosis not present

## 2022-07-29 DIAGNOSIS — I70213 Atherosclerosis of native arteries of extremities with intermittent claudication, bilateral legs: Secondary | ICD-10-CM | POA: Diagnosis present

## 2022-07-29 DIAGNOSIS — G8929 Other chronic pain: Secondary | ICD-10-CM | POA: Diagnosis present

## 2022-07-29 DIAGNOSIS — I77810 Thoracic aortic ectasia: Secondary | ICD-10-CM | POA: Diagnosis present

## 2022-07-29 DIAGNOSIS — I214 Non-ST elevation (NSTEMI) myocardial infarction: Secondary | ICD-10-CM

## 2022-07-29 DIAGNOSIS — N179 Acute kidney failure, unspecified: Secondary | ICD-10-CM | POA: Diagnosis not present

## 2022-07-29 DIAGNOSIS — J811 Chronic pulmonary edema: Secondary | ICD-10-CM | POA: Diagnosis present

## 2022-07-29 DIAGNOSIS — D62 Acute posthemorrhagic anemia: Secondary | ICD-10-CM | POA: Diagnosis not present

## 2022-07-29 DIAGNOSIS — M1990 Unspecified osteoarthritis, unspecified site: Secondary | ICD-10-CM | POA: Diagnosis present

## 2022-07-29 DIAGNOSIS — M48061 Spinal stenosis, lumbar region without neurogenic claudication: Secondary | ICD-10-CM | POA: Diagnosis present

## 2022-07-29 DIAGNOSIS — I4891 Unspecified atrial fibrillation: Secondary | ICD-10-CM | POA: Diagnosis not present

## 2022-07-29 DIAGNOSIS — I200 Unstable angina: Secondary | ICD-10-CM | POA: Diagnosis not present

## 2022-07-29 DIAGNOSIS — I2510 Atherosclerotic heart disease of native coronary artery without angina pectoris: Secondary | ICD-10-CM | POA: Diagnosis not present

## 2022-07-29 DIAGNOSIS — Z8249 Family history of ischemic heart disease and other diseases of the circulatory system: Secondary | ICD-10-CM | POA: Diagnosis not present

## 2022-07-29 DIAGNOSIS — Z01810 Encounter for preprocedural cardiovascular examination: Secondary | ICD-10-CM

## 2022-07-29 DIAGNOSIS — E872 Acidosis, unspecified: Secondary | ICD-10-CM | POA: Diagnosis not present

## 2022-07-29 DIAGNOSIS — K219 Gastro-esophageal reflux disease without esophagitis: Secondary | ICD-10-CM | POA: Diagnosis present

## 2022-07-29 DIAGNOSIS — I252 Old myocardial infarction: Secondary | ICD-10-CM | POA: Diagnosis not present

## 2022-07-29 DIAGNOSIS — E8770 Fluid overload, unspecified: Secondary | ICD-10-CM | POA: Diagnosis not present

## 2022-07-29 DIAGNOSIS — I25110 Atherosclerotic heart disease of native coronary artery with unstable angina pectoris: Secondary | ICD-10-CM | POA: Diagnosis present

## 2022-07-29 DIAGNOSIS — J440 Chronic obstructive pulmonary disease with acute lower respiratory infection: Secondary | ICD-10-CM | POA: Diagnosis present

## 2022-07-29 DIAGNOSIS — E039 Hypothyroidism, unspecified: Secondary | ICD-10-CM | POA: Diagnosis present

## 2022-07-29 LAB — BLOOD GAS, ARTERIAL
Acid-base deficit: 0.4 mmol/L (ref 0.0–2.0)
Bicarbonate: 22.9 mmol/L (ref 20.0–28.0)
Drawn by: 12971
O2 Saturation: 99 %
Patient temperature: 36.9
pCO2 arterial: 33 mmHg (ref 32–48)
pH, Arterial: 7.45 (ref 7.35–7.45)
pO2, Arterial: 85 mmHg (ref 83–108)

## 2022-07-29 LAB — LIPID PANEL
Cholesterol: 122 mg/dL (ref 0–200)
HDL: 31 mg/dL — ABNORMAL LOW (ref >40)
LDL Cholesterol: 65 mg/dL (ref 0–99)
Total CHOL/HDL Ratio: 3.9 RATIO
Triglycerides: 131 mg/dL (ref <150)
VLDL: 26 mg/dL (ref 0–40)

## 2022-07-29 LAB — CBC
HCT: 36.4 % — ABNORMAL LOW (ref 39.0–52.0)
Hemoglobin: 12.6 g/dL — ABNORMAL LOW (ref 13.0–17.0)
MCH: 31.2 pg (ref 26.0–34.0)
MCHC: 34.6 g/dL (ref 30.0–36.0)
MCV: 90.1 fL (ref 80.0–100.0)
Platelets: 162 10*3/uL (ref 150–400)
RBC: 4.04 MIL/uL — ABNORMAL LOW (ref 4.22–5.81)
RDW: 12.4 % (ref 11.5–15.5)
WBC: 4.5 10*3/uL (ref 4.0–10.5)
nRBC: 0 % (ref 0.0–0.2)

## 2022-07-29 LAB — PULMONARY FUNCTION TEST
FEF 25-75 Pre: 2.7 L/sec
FEF2575-%Pred-Pre: 104 %
FEV1-%Pred-Pre: 93 %
FEV1-Pre: 3.06 L
FEV1FVC-%Pred-Pre: 98 %
FEV6-%Pred-Pre: 100 %
FEV6-Pre: 4.18 L
FEV6FVC-%Pred-Pre: 105 %
FVC-%Pred-Pre: 94 %
FVC-Pre: 4.18 L
Pre FEV1/FVC ratio: 73 %
Pre FEV6/FVC Ratio: 100 %

## 2022-07-29 LAB — BASIC METABOLIC PANEL
Anion gap: 10 (ref 5–15)
BUN: 13 mg/dL (ref 8–23)
CO2: 22 mmol/L (ref 22–32)
Calcium: 9.2 mg/dL (ref 8.9–10.3)
Chloride: 107 mmol/L (ref 98–111)
Creatinine, Ser: 1.18 mg/dL (ref 0.61–1.24)
GFR, Estimated: >60 mL/min (ref >60)
Glucose, Bld: 92 mg/dL (ref 70–99)
Potassium: 3.7 mmol/L (ref 3.5–5.1)
Sodium: 139 mmol/L (ref 135–145)

## 2022-07-29 LAB — COMPREHENSIVE METABOLIC PANEL
ALT: 20 U/L (ref 0–44)
AST: 28 U/L (ref 15–41)
Albumin: 3.5 g/dL (ref 3.5–5.0)
Alkaline Phosphatase: 31 U/L — ABNORMAL LOW (ref 38–126)
Anion gap: 8 (ref 5–15)
BUN: 16 mg/dL (ref 8–23)
CO2: 22 mmol/L (ref 22–32)
Calcium: 9.2 mg/dL (ref 8.9–10.3)
Chloride: 109 mmol/L (ref 98–111)
Creatinine, Ser: 1.24 mg/dL (ref 0.61–1.24)
GFR, Estimated: >60 mL/min (ref >60)
Glucose, Bld: 100 mg/dL — ABNORMAL HIGH (ref 70–99)
Potassium: 4 mmol/L (ref 3.5–5.1)
Sodium: 139 mmol/L (ref 135–145)
Total Bilirubin: 0.6 mg/dL (ref 0.3–1.2)
Total Protein: 5.9 g/dL — ABNORMAL LOW (ref 6.5–8.1)

## 2022-07-29 LAB — HEPARIN LEVEL (UNFRACTIONATED)
Heparin Unfractionated: <0.1 IU/mL — ABNORMAL LOW (ref 0.30–0.70)
Heparin Unfractionated: <0.1 IU/mL — ABNORMAL LOW (ref 0.30–0.70)
Heparin Unfractionated: <0.1 IU/mL — ABNORMAL LOW (ref 0.30–0.70)

## 2022-07-29 LAB — APTT: aPTT: 58 seconds — ABNORMAL HIGH (ref 24–36)

## 2022-07-29 LAB — URINALYSIS, ROUTINE W REFLEX MICROSCOPIC
Bacteria, UA: NONE SEEN
Bilirubin Urine: NEGATIVE
Glucose, UA: NEGATIVE mg/dL
Ketones, ur: NEGATIVE mg/dL
Leukocytes,Ua: NEGATIVE
Nitrite: NEGATIVE
Protein, ur: 30 mg/dL — AB
Specific Gravity, Urine: 1.024 (ref 1.005–1.030)
pH: 6 (ref 5.0–8.0)

## 2022-07-29 LAB — PROTIME-INR
INR: 1 (ref 0.8–1.2)
Prothrombin Time: 13.4 seconds (ref 11.4–15.2)

## 2022-07-29 LAB — SURGICAL PCR SCREEN
MRSA, PCR: NEGATIVE
Staphylococcus aureus: NEGATIVE

## 2022-07-29 LAB — TSH: TSH: 0.392 u[IU]/mL (ref 0.350–4.500)

## 2022-07-29 LAB — PREPARE RBC (CROSSMATCH)

## 2022-07-29 MED ORDER — EPINEPHRINE HCL 5 MG/250ML IV SOLN IN NS
0.0000 ug/min | INTRAVENOUS | Status: DC
  Filled 2022-07-29: qty 250

## 2022-07-29 MED ORDER — PLASMA-LYTE A IV SOLN
INTRAVENOUS | Status: DC
  Filled 2022-07-29 (×3): qty 2.5

## 2022-07-29 MED ORDER — TEMAZEPAM 7.5 MG PO CAPS
15.0000 mg | ORAL_CAPSULE | Freq: Once | ORAL | Status: AC | PRN
  Administered 2022-07-29: 15 mg via ORAL
  Filled 2022-07-29: qty 2

## 2022-07-29 MED ORDER — GUAIFENESIN ER 600 MG PO TB12
600.0000 mg | ORAL_TABLET | Freq: Two times a day (BID) | ORAL | Status: DC
  Administered 2022-07-29 – 2022-08-06 (×14): 600 mg via ORAL
  Filled 2022-07-29 (×14): qty 1

## 2022-07-29 MED ORDER — POTASSIUM CHLORIDE 2 MEQ/ML IV SOLN
80.0000 meq | INTRAVENOUS | Status: DC
  Filled 2022-07-29: qty 40

## 2022-07-29 MED ORDER — BISACODYL 5 MG PO TBEC
5.0000 mg | DELAYED_RELEASE_TABLET | Freq: Once | ORAL | Status: DC
  Filled 2022-07-29: qty 1

## 2022-07-29 MED ORDER — MOMETASONE FURO-FORMOTEROL FUM 200-5 MCG/ACT IN AERO
2.0000 | INHALATION_SPRAY | Freq: Two times a day (BID) | RESPIRATORY_TRACT | Status: DC
  Administered 2022-07-29: 2 via RESPIRATORY_TRACT
  Filled 2022-07-29: qty 8.8

## 2022-07-29 MED ORDER — METOPROLOL TARTRATE 12.5 MG HALF TABLET
12.5000 mg | ORAL_TABLET | Freq: Once | ORAL | Status: AC
  Administered 2022-07-30: 12.5 mg via ORAL
  Filled 2022-07-29: qty 1

## 2022-07-29 MED ORDER — TRANEXAMIC ACID (OHS) BOLUS VIA INFUSION
15.0000 mg/kg | INTRAVENOUS | Status: AC
  Administered 2022-07-30: 1333.5 mg via INTRAVENOUS
  Filled 2022-07-29: qty 1334

## 2022-07-29 MED ORDER — MAGNESIUM SULFATE 50 % IJ SOLN
40.0000 meq | INTRAMUSCULAR | Status: DC
  Filled 2022-07-29: qty 9.85

## 2022-07-29 MED ORDER — INSULIN REGULAR(HUMAN) IN NACL 100-0.9 UT/100ML-% IV SOLN
INTRAVENOUS | Status: AC
  Administered 2022-07-30: .8 [IU]/h via INTRAVENOUS
  Filled 2022-07-29: qty 100

## 2022-07-29 MED ORDER — DEXMEDETOMIDINE HCL IN NACL 400 MCG/100ML IV SOLN
0.1000 ug/kg/h | INTRAVENOUS | Status: AC
  Administered 2022-07-30: .3 ug/kg/h via INTRAVENOUS
  Filled 2022-07-29: qty 100

## 2022-07-29 MED ORDER — VANCOMYCIN HCL 1500 MG/300ML IV SOLN
1500.0000 mg | INTRAVENOUS | Status: AC
  Administered 2022-07-30: 1500 mg via INTRAVENOUS
  Filled 2022-07-29: qty 300

## 2022-07-29 MED ORDER — POTASSIUM CHLORIDE CRYS ER 20 MEQ PO TBCR
40.0000 meq | EXTENDED_RELEASE_TABLET | Freq: Once | ORAL | Status: AC
  Administered 2022-07-29: 40 meq via ORAL
  Filled 2022-07-29: qty 2

## 2022-07-29 MED ORDER — CHLORHEXIDINE GLUCONATE 4 % EX LIQD
60.0000 mL | Freq: Once | CUTANEOUS | Status: AC
  Administered 2022-07-29: 4 via TOPICAL
  Filled 2022-07-29: qty 60

## 2022-07-29 MED ORDER — TRANEXAMIC ACID 1000 MG/10ML IV SOLN
1.5000 mg/kg/h | INTRAVENOUS | Status: AC
  Administered 2022-07-30: 1.5 mg/kg/h via INTRAVENOUS
  Filled 2022-07-29: qty 25

## 2022-07-29 MED ORDER — CHLORHEXIDINE GLUCONATE 0.12 % MT SOLN
15.0000 mL | Freq: Once | OROMUCOSAL | Status: AC
  Administered 2022-07-30: 15 mL via OROMUCOSAL
  Filled 2022-07-29: qty 15

## 2022-07-29 MED ORDER — CEFAZOLIN SODIUM-DEXTROSE 2-4 GM/100ML-% IV SOLN
2.0000 g | INTRAVENOUS | Status: AC
  Administered 2022-07-30 (×2): 2 g via INTRAVENOUS
  Filled 2022-07-29: qty 100

## 2022-07-29 MED ORDER — DIAZEPAM 5 MG PO TABS
5.0000 mg | ORAL_TABLET | ORAL | Status: DC | PRN
  Administered 2022-07-29: 5 mg via ORAL
  Filled 2022-07-29: qty 1

## 2022-07-29 MED ORDER — PHENYLEPHRINE HCL-NACL 20-0.9 MG/250ML-% IV SOLN
30.0000 ug/min | INTRAVENOUS | Status: AC
  Administered 2022-07-30: 50 ug/min via INTRAVENOUS
  Filled 2022-07-29: qty 250

## 2022-07-29 MED ORDER — TRANEXAMIC ACID (OHS) PUMP PRIME SOLUTION
2.0000 mg/kg | INTRAVENOUS | Status: DC
  Filled 2022-07-29: qty 1.78

## 2022-07-29 MED ORDER — MILRINONE LACTATE IN DEXTROSE 20-5 MG/100ML-% IV SOLN
0.3000 ug/kg/min | INTRAVENOUS | Status: AC
  Administered 2022-07-30: .25 ug/kg/min via INTRAVENOUS
  Filled 2022-07-29: qty 100

## 2022-07-29 MED ORDER — CEFAZOLIN SODIUM-DEXTROSE 2-4 GM/100ML-% IV SOLN
2.0000 g | INTRAVENOUS | Status: DC
  Filled 2022-07-29: qty 100

## 2022-07-29 MED ORDER — CHLORHEXIDINE GLUCONATE 4 % EX LIQD
60.0000 mL | Freq: Once | CUTANEOUS | Status: AC
  Administered 2022-07-30: 4 via TOPICAL
  Filled 2022-07-29: qty 60

## 2022-07-29 MED ORDER — NOREPINEPHRINE 4 MG/250ML-% IV SOLN
0.0000 ug/min | INTRAVENOUS | Status: DC
  Filled 2022-07-29: qty 250

## 2022-07-29 MED ORDER — DIAZEPAM 5 MG PO TABS
5.0000 mg | ORAL_TABLET | Freq: Once | ORAL | Status: AC
  Administered 2022-07-30: 5 mg via ORAL
  Filled 2022-07-29: qty 1

## 2022-07-29 MED ORDER — HEPARIN 30,000 UNITS/1000 ML (OHS) CELLSAVER SOLUTION
Status: DC
  Filled 2022-07-29: qty 1000

## 2022-07-29 MED FILL — Heparin Sodium (Porcine) Inj 1000 Unit/ML: Qty: 2000 | Status: AC

## 2022-07-29 NOTE — Progress Notes (Signed)
ANTICOAGULATION CONSULT NOTE - Follow Up Consult  Pharmacy Consult for Heparin Indication: chest pain/ACS  No Known Allergies  Patient Measurements: Height: '5\' 9"'$  (175.3 cm) Weight: 88.9 kg (196 lb) IBW/kg (Calculated) : 70.7 Heparin Dosing Weight: 88.5 kg  Vital Signs: Temp: 97.9 F (36.6 C) (11/08 0612) Temp Source: Oral (11/08 0612) BP: 146/86 (11/08 0612)  Labs: Recent Labs    07/28/22 1303 07/28/22 1800 07/29/22 0311 07/29/22 0735  HGB 13.3  --  12.6*  --   HCT 39.0  --  36.4*  --   PLT 206  --  162  --   HEPARINUNFRC  --   --  <0.10* <0.10*  CREATININE 1.06  --  1.18  --   TROPONINIHS 3,095* 3,052*  --   --     Estimated Creatinine Clearance: 67.9 mL/min (by C-G formula based on SCr of 1.18 mg/dL).   Medications:  No anticoagulation PTA.  Assessment:  66 YO male s/p cath with plans for possible CABG.   Heparin level subtherapeutic at < 0.1. CBC stable. No bleeding or infusion issues noted per nursing.  Goal of Therapy:  Heparin level 0.3-0.7 units/ml Monitor platelets by anticoagulation protocol: Yes   Plan:  Increase heparin infusion to 1500 units/hr  Repeat HL in 6 hours and daily while on heparin Monitor CBC and s/sx of bleeding daily  Sinda Du, PharmD Candidate 07/29/2022,8:29 AM

## 2022-07-29 NOTE — Progress Notes (Signed)
ANTICOAGULATION CONSULT NOTE - Follow Up Consult  Pharmacy Consult for Heparin Indication: chest pain/ACS  No Known Allergies  Patient Measurements: Height: '5\' 9"'$  (175.3 cm) Weight: 88.9 kg (196 lb) IBW/kg (Calculated) : 70.7 Heparin Dosing Weight: 88.5 kg  Vital Signs: Temp: 98.5 F (36.9 C) (11/08 1514) Temp Source: Oral (11/08 1514) BP: 144/74 (11/08 1514) Pulse Rate: 59 (11/08 1514)  Labs: Recent Labs    07/28/22 1303 07/28/22 1800 07/29/22 0311 07/29/22 0735 07/29/22 1551 07/29/22 1553  HGB 13.3  --  12.6*  --   --   --   HCT 39.0  --  36.4*  --   --   --   PLT 206  --  162  --   --   --   LABPROT  --   --   --   --   --  13.4  INR  --   --   --   --   --  1.0  HEPARINUNFRC  --   --  <0.10* <0.10* <0.10*  --   CREATININE 1.06  --  1.18  --   --   --   TROPONINIHS 3,095* 3,052*  --   --   --   --      Estimated Creatinine Clearance: 67.9 mL/min (by C-G formula based on SCr of 1.18 mg/dL).    Assessment:  66 YO male s/p cath with plans for CABG 11/9. Heparin level remains <0.1.  Goal of Therapy:  Heparin level 0.3-0.7 units/ml Monitor platelets by anticoagulation protocol: Yes   Plan:  Increase heparin infusion to 1700 units/h Heparin level with am labs  Arrie Senate, PharmD, BCPS, [facility] Clinical Pharmacist [phone] Please check AMION for all [place] numbers 07/29/2022

## 2022-07-29 NOTE — Anesthesia Preprocedure Evaluation (Signed)
Anesthesia Evaluation  Patient identified by MRN, date of birth, ID band Patient awake    Reviewed: Allergy & Precautions, NPO status , Patient's Chart, lab work & pertinent test results  History of Anesthesia Complications Negative for: history of anesthetic complications  Airway Mallampati: I  TM Distance: >3 FB Neck ROM: Full    Dental  (+) Poor Dentition, Partial Lower, Dental Advisory Given   Pulmonary neg shortness of breath, neg COPD, neg recent URI, Current Smoker   Pulmonary exam normal        Cardiovascular hypertension, Pt. on medications + CAD and + Past MI  (-) CHF Normal cardiovascular exam(-) dysrhythmias     Prox RCA to Mid RCA lesion is 65% stenosed.   Mid RCA lesion is 90% stenosed.  Irregular, eccentric calcified   Dist RCA lesion is 95% stenosed. ->  Almost channel due to eccentric location   Mid LAD-1 lesion is 90% stenosed with 30% stenosed side branch in 1st Diag.  Heavily calcified   Mid LAD-2 lesion is 80% stenosed.  Heavily calcified   Dist LAD lesion is 90% stenosed.  Heavily calcified   1st Diag lesion is 70% stenosed.   ----------------------------------   LV end diastolic pressure is mildly elevated.   There is no aortic valve stenosis.   POST-OPERATIVE DIAGNOSIS:    Severe heavily calcified two-vessel CAD:   LAD 90% eccentric/irregular stenosis at major D1 branch that has diffuse disease as well.  This lesion is then followed by tandem 80% and 90% eccentric calcified lesions.  Relatively long segment of diffuse disease heavily calcified; downstream LAD beyond this lesion is relatively free of disease.  Highly calcified RCA with mid segment smooth heavily calcified eccentric 60 to 70% stenosis followed by a 90% irregular almost ulcerated calcified plaque just at the been followed by a 99% very eccentric lesion just prior to the bifurcation into the PDA and PL.  The downstream vessels are  relatively free of disease.  Relatively normal LCx with a major lateral OM 1, relatively small OM 2 and small LPL 1.  Mildly elevated LVEDP; normal EF by Echo earlier this month.      PLAN OF CARE: Admit to inpatient  -> restart IV heparin 2 hours after zephyr band removal; will call CVTS to discuss CABG options with the knowledge that percutaneous options are still available.  However talking with Dr. Irish Lack the patient has not been very compliant with medications and therefore would not be favorable for extensive atherectomy PCI with multiple stents (potentially even bifurcation) if he is not to be compliant with taking his medications. He will need titration of his antianginals, statin and glycemic control.  Remainder the plan per H&P from Melina Copa, PA      Neuro/Psych  Neuromuscular disease CVA  negative psych ROS   GI/Hepatic ,GERD  Medicated,,  Endo/Other  Hypothyroidism  Morbid obesity  Renal/GU      Musculoskeletal  (+) Arthritis ,    Abdominal   Peds  Hematology   Anesthesia Other Findings   Reproductive/Obstetrics                             Anesthesia Physical Anesthesia Plan  ASA: 4  Anesthesia Plan: General   Post-op Pain Management: Tylenol PO (pre-op)*   Induction: Intravenous  PONV Risk Score and Plan: 2 and Dexamethasone and Ondansetron  Airway Management Planned: Oral ETT  Additional Equipment: Arterial line, PA Cath, 3D  TEE and Ultrasound Guidance Line Placement  Intra-op Plan:   Post-operative Plan: Post-operative intubation/ventilation  Informed Consent: I have reviewed the patients History and Physical, chart, labs and discussed the procedure including the risks, benefits and alternatives for the proposed anesthesia with the patient or authorized representative who has indicated his/her understanding and acceptance.     Dental advisory given  Plan Discussed with: Anesthesiologist, CRNA and  Surgeon  Anesthesia Plan Comments: (   )        Anesthesia Quick Evaluation

## 2022-07-29 NOTE — Progress Notes (Signed)
CARDIAC REHAB PHASE I    Pre OHS education including move in the tub, sternal precautions, OHS booklet, OHS handout, IS use, early mobility, pain management, and home needs at discharge reviewed. All questions and concerns addressed. Plan for OR tomorrow per pt. . Able to reach 2500 with IS today. Will continue to follow.    1320-1400  Vanessa Barbara, RN BSN 07/29/2022 1:52 PM

## 2022-07-29 NOTE — Progress Notes (Signed)
Mobility Specialist - Progress Note   07/29/22 1227  Mobility  Activity Ambulated with assistance in hallway  Level of Assistance Standby assist, set-up cues, supervision of patient - no hands on  Assistive Device None  Distance Ambulated (ft) 340 ft  Activity Response Tolerated well  Mobility Referral No  $Mobility charge 1 Mobility   Pt received in bed and agreeable to mobility. Pt c/o back and knee pain throughout ambulation. Pt was returned to bed with all needs met.   Larey Seat

## 2022-07-29 NOTE — Progress Notes (Addendum)
Rounding Note    Patient Name: Carl [name] Date of Encounter: 07/29/2022  [place] Cardiologist: Larae Grooms, MD   Subjective   No chest pain overnight.   Inpatient Medications    Scheduled Meds:  amLODipine  5 mg Oral Daily   aspirin EC  81 mg Oral Daily   fenofibrate  160 mg Oral Daily   irbesartan  150 mg Oral Daily   levothyroxine  125 mcg Oral Q0600   pantoprazole  80 mg Oral Daily   potassium chloride  40 mEq Oral Once   rosuvastatin  40 mg Oral q morning   sodium chloride flush  3 mL Intravenous Q12H   Continuous Infusions:  sodium chloride     heparin 1,200 Units/hr (07/29/22 0317)   PRN Meds: sodium chloride, acetaminophen, HYDROcodone-acetaminophen, morphine injection, nitroGLYCERIN, ondansetron (ZOFRAN) IV, sodium chloride flush   Vital Signs    Vitals:   07/28/22 1825 07/28/22 1840 07/28/22 1934 07/29/22 0612  BP: (!) 156/88 (!) 139/93 111/63 (!) 146/86  Pulse: 70 (!) 57    Resp:   13 18  Temp:   98.6 F (37 C) 97.9 F (36.6 C)  TempSrc:   Oral Oral  SpO2: 97% 97%  98%  Weight:      Height:        Intake/Output Summary (Last 24 hours) at 07/29/2022 0808 Last data filed at 07/29/2022 0616 Gross per 24 hour  Intake 995.96 ml  Output 850 ml  Net 145.96 ml      07/28/2022   12:53 PM 07/28/2022   11:13 AM 07/01/2022    9:54 AM  Last 3 Weights  Weight (lbs) 196 lb 196 lb 9.6 oz 201 lb  Weight (kg) 88.905 kg 89.177 kg 91.173 kg      Telemetry    Sinus Rhythm - Personally Reviewed  ECG    Sinus bradycardia with TWI in inferolateral leads - Personally Reviewed  Physical Exam   GEN: No acute distress.   Neck: No JVD Cardiac: RRR, no murmurs, rubs, or gallops.  Respiratory: Expiratory wheeze GI: Soft, nontender, non-distended  MS: No edema; No deformity. Right radial cath site stable Neuro:  Nonfocal  Psych: Normal affect   Labs    High Sensitivity Troponin:   Recent Labs  Lab 07/28/22 1303 07/28/22 1800   TROPONINIHS 3,095* 3,052*     Chemistry Recent Labs  Lab 07/28/22 1303 07/29/22 0311  NA 137 139  K 3.3* 3.7  CL 101 107  CO2 22 22  GLUCOSE 91 92  BUN 12 13  CREATININE 1.06 1.18  CALCIUM 9.5 9.2  GFRNONAA >60 >60  ANIONGAP 14 10    Lipids  Recent Labs  Lab 07/29/22 0311  CHOL 122  TRIG 131  HDL 31*  LDLCALC 65  CHOLHDL 3.9    Hematology Recent Labs  Lab 07/28/22 1303 07/29/22 0311  WBC 6.0 4.5  RBC 4.27 4.04*  HGB 13.3 12.6*  HCT 39.0 36.4*  MCV 91.3 90.1  MCH 31.1 31.2  MCHC 34.1 34.6  RDW 12.4 12.4  PLT 206 162   Thyroid  Recent Labs  Lab 07/28/22 1303  TSH 0.572    BNPNo results for input(s): "BNP", "PROBNP" in the last 168 hours.  DDimer No results for input(s): "DDIMER" in the last 168 hours.   Radiology    CARDIAC CATHETERIZATION  Result Date: 07/28/2022   Prox RCA to Mid RCA lesion is 65% stenosed.   Mid RCA lesion is 90%  stenosed.  Irregular, eccentric calcified   Dist RCA lesion is 95% stenosed. ->  Almost channel due to eccentric location   Mid LAD-1 lesion is 90% stenosed with 30% stenosed side branch in 1st Diag.  Heavily calcified   Mid LAD-2 lesion is 80% stenosed.  Heavily calcified   Dist LAD lesion is 90% stenosed.  Heavily calcified   1st Diag lesion is 70% stenosed.   ----------------------------------   LV end diastolic pressure is mildly elevated.   There is no aortic valve stenosis. POST-OPERATIVE DIAGNOSIS:  Severe heavily calcified two-vessel CAD: LAD 90% eccentric/irregular stenosis at major D1 branch that has diffuse disease as well.  This lesion is then followed by tandem 80% and 90% eccentric calcified lesions.  Relatively long segment of diffuse disease heavily calcified; downstream LAD beyond this lesion is relatively free of disease. Highly calcified RCA with mid segment smooth heavily calcified eccentric 60 to 70% stenosis followed by a 90% irregular almost ulcerated calcified plaque just at the been followed by a 99% very  eccentric lesion just prior to the bifurcation into the PDA and PL.  The downstream vessels are relatively free of disease. Relatively normal LCx with a major lateral OM 1, relatively small OM 2 and small LPL 1. Mildly elevated LVEDP; normal EF by Echo earlier this month. PLAN OF CARE: Admit to inpatient  -> restart IV heparin 2 hours after zephyr band removal; will call CVTS to discuss CABG options with the knowledge that percutaneous options are still available.  However talking with Dr. Irish Lack the patient has not been very compliant with medications and therefore would not be favorable for extensive atherectomy PCI with multiple stents (potentially even bifurcation) if he is not to be compliant with taking his medications. He will need titration of his antianginals, statin and glycemic control.  Remainder the plan per H&P from Melina Copa, [place].   DG Chest 2 View  Result Date: 07/28/2022 CLINICAL DATA:  Chest pain EXAM: CHEST - 2 VIEW COMPARISON:  Chest x-ray dated December 24, 2016 FINDINGS: The heart size and mediastinal contours are within normal limits. Both lungs are clear. The visualized skeletal structures are unremarkable. IMPRESSION: No active cardiopulmonary disease. Electronically Signed   By: Yetta Glassman M.D.   On: 07/28/2022 13:26    Cardiac Studies   Cath: 07/28/22    Prox RCA to Mid RCA lesion is 65% stenosed.   Mid RCA lesion is 90% stenosed.  Irregular, eccentric calcified   Dist RCA lesion is 95% stenosed. ->  Almost channel due to eccentric location   Mid LAD-1 lesion is 90% stenosed with 30% stenosed side branch in 1st Diag.  Heavily calcified   Mid LAD-2 lesion is 80% stenosed.  Heavily calcified   Dist LAD lesion is 90% stenosed.  Heavily calcified   1st Diag lesion is 70% stenosed.   ----------------------------------   LV end diastolic pressure is mildly elevated.   There is no aortic valve stenosis.   POST-OPERATIVE DIAGNOSIS:   Severe heavily calcified two-vessel  CAD:  LAD 90% eccentric/irregular stenosis at major D1 branch that has diffuse disease as well.  This lesion is then followed by tandem 80% and 90% eccentric calcified lesions.  Relatively long segment of diffuse disease heavily calcified; downstream LAD beyond this lesion is relatively free of disease. Highly calcified RCA with mid segment smooth heavily calcified eccentric 60 to 70% stenosis followed by a 90% irregular almost ulcerated calcified plaque just at the been followed by a 99% very  eccentric lesion just prior to the bifurcation into the PDA and PL.  The downstream vessels are relatively free of disease. Relatively normal LCx with a major lateral OM 1, relatively small OM 2 and small LPL 1. Mildly elevated LVEDP; normal EF by Echo earlier this month.      PLAN OF CARE: Admit to inpatient  -> restart IV heparin 2 hours after zephyr band removal; will call CVTS to discuss CABG options with the knowledge that percutaneous options are still available.  However talking with Dr. Irish Lack the patient has not been very compliant with medications and therefore would not be favorable for extensive atherectomy PCI with multiple stents (potentially even bifurcation) if he is not to be compliant with taking his medications. He will need titration of his antianginals, statin and glycemic control.  Remainder the plan per H&P from Melina Copa, [place].   Diagnostic Dominance: Right   Patient Profile     66 y.o. male with history of nonobstructive CAD by cath 2011 with potential component of vasospasm, HTN, tobacco abuse, suspected PAD by dopplers 07/2022, stroke, GERD, HLD, hypothyroidism, chronic orthopedic pain, mild dilation of aorta who was seen in the office 11/7 with ongoing chest pain and referred for same day cardiac cath.  Assessment & Plan    CAD Unstable Angina -- underwent cardiac cath noted above with severe multivessel CAD. TCTS consulted for consideration of CABG, evaluation pending.No  chest pain. -- IV heparin, ASA, statin, ARB, fenofibrate  HTN -- stable overall -- continue amlodipine, avapro. No room for BB with baseline bradycardia  HLD -- HDL 31, LDL 65 -- on Crestor '40mg'$  daily and fenofibrate   Mild dilation of aorta -- outpatient follow up imaging   Hypothyroidism -- TSH 0.572 -- continue synthroid  Tobacco use -- cessation advised    For questions or updates, please contact [place] Please consult [email or web address] for contact info under        Signed, Reino Bellis, NP  07/29/2022, 8:08 AM    Patient seen, examined. Available data reviewed. Agree with findings, assessment, and plan as outlined by Reino Bellis, NP.  The patient is independently interviewed and examined.  His family is at the bedside.  He is alert, oriented, no distress.  HEENT is normal except for poor dentition, carotid upstrokes are normal, JVP is normal, lungs clear bilaterally, heart is regular rate abdomen soft nontender extremities have no edema cardiac catheterization films were reviewed with the patient has severe calcific multivessel CAD involving the mid LAD and segmental nature as well as the distal RCA.  He is undergoing consultation for coronary bypass surgery in the setting of diffusely calcified vessels with severe multivessel disease.  I agree with the medication program outlined above.  He is chest pain-free on IV heparin.  Sherren Mocha, M.D. 07/29/2022 10:56 AM
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
## 2022-07-30 ENCOUNTER — Other Ambulatory Visit: Payer: Self-pay

## 2022-07-30 ENCOUNTER — Encounter (HOSPITAL_COMMUNITY): Payer: Self-pay | Admitting: Cardiovascular Disease

## 2022-07-30 ENCOUNTER — Inpatient Hospital Stay (HOSPITAL_COMMUNITY): Payer: Medicare HMO

## 2022-07-30 ENCOUNTER — Inpatient Hospital Stay (HOSPITAL_COMMUNITY): Payer: Medicare HMO | Admitting: Anesthesiology

## 2022-07-30 ENCOUNTER — Inpatient Hospital Stay (HOSPITAL_COMMUNITY)
Admission: EM | Disposition: A | Discharge status: Home / Self Care | Payer: Self-pay | Source: Home / Self Care | Attending: Cardiothoracic Surgery

## 2022-07-30 DIAGNOSIS — I2510 Atherosclerotic heart disease of native coronary artery without angina pectoris: Secondary | ICD-10-CM

## 2022-07-30 DIAGNOSIS — I252 Old myocardial infarction: Secondary | ICD-10-CM

## 2022-07-30 DIAGNOSIS — I10 Essential (primary) hypertension: Secondary | ICD-10-CM | POA: Diagnosis not present

## 2022-07-30 DIAGNOSIS — Z951 Presence of aortocoronary bypass graft: Secondary | ICD-10-CM

## 2022-07-30 DIAGNOSIS — F17210 Nicotine dependence, cigarettes, uncomplicated: Secondary | ICD-10-CM

## 2022-07-30 HISTORY — PX: TEE WITHOUT CARDIOVERSION: SHX5443

## 2022-07-30 HISTORY — PX: CORONARY ARTERY BYPASS GRAFT: SHX141

## 2022-07-30 LAB — POCT I-STAT, CHEM 8
BUN: 15 mg/dL (ref 8–23)
BUN: 15 mg/dL (ref 8–23)
BUN: 15 mg/dL (ref 8–23)
BUN: 16 mg/dL (ref 8–23)
BUN: 16 mg/dL (ref 8–23)
BUN: 17 mg/dL (ref 8–23)
BUN: 16 mg/dL (ref 8–23)
Calcium, Ion: 1.29 mmol/L (ref 1.15–1.40)
Calcium, Ion: 1.3 mmol/L (ref 1.15–1.40)
Calcium, Ion: 1.17 mmol/L (ref 1.15–1.40)
Calcium, Ion: 1.19 mmol/L (ref 1.15–1.40)
Calcium, Ion: 1.13 mmol/L — ABNORMAL LOW (ref 1.15–1.40)
Calcium, Ion: 1.47 mmol/L — ABNORMAL HIGH (ref 1.15–1.40)
Calcium, Ion: 1.39 mmol/L (ref 1.15–1.40)
Chloride: 105 mmol/L (ref 98–111)
Chloride: 104 mmol/L (ref 98–111)
Chloride: 103 mmol/L (ref 98–111)
Chloride: 103 mmol/L (ref 98–111)
Chloride: 100 mmol/L (ref 98–111)
Chloride: 104 mmol/L (ref 98–111)
Chloride: 101 mmol/L (ref 98–111)
Creatinine, Ser: 1.1 mg/dL (ref 0.61–1.24)
Creatinine, Ser: 1 mg/dL (ref 0.61–1.24)
Creatinine, Ser: 0.9 mg/dL (ref 0.61–1.24)
Creatinine, Ser: 1 mg/dL (ref 0.61–1.24)
Creatinine, Ser: 0.9 mg/dL (ref 0.61–1.24)
Creatinine, Ser: 1 mg/dL (ref 0.61–1.24)
Creatinine, Ser: 1 mg/dL (ref 0.61–1.24)
Glucose, Bld: 103 mg/dL — ABNORMAL HIGH (ref 70–99)
Glucose, Bld: 111 mg/dL — ABNORMAL HIGH (ref 70–99)
Glucose, Bld: 138 mg/dL — ABNORMAL HIGH (ref 70–99)
Glucose, Bld: 150 mg/dL — ABNORMAL HIGH (ref 70–99)
Glucose, Bld: 135 mg/dL — ABNORMAL HIGH (ref 70–99)
Glucose, Bld: 139 mg/dL — ABNORMAL HIGH (ref 70–99)
Glucose, Bld: 138 mg/dL — ABNORMAL HIGH (ref 70–99)
HCT: 41 % (ref 39.0–52.0)
HCT: 35 % — ABNORMAL LOW (ref 39.0–52.0)
HCT: 30 % — ABNORMAL LOW (ref 39.0–52.0)
HCT: 31 % — ABNORMAL LOW (ref 39.0–52.0)
HCT: 28 % — ABNORMAL LOW (ref 39.0–52.0)
HCT: 29 % — ABNORMAL LOW (ref 39.0–52.0)
HCT: 28 % — ABNORMAL LOW (ref 39.0–52.0)
Hemoglobin: 13.9 g/dL (ref 13.0–17.0)
Hemoglobin: 11.9 g/dL — ABNORMAL LOW (ref 13.0–17.0)
Hemoglobin: 10.2 g/dL — ABNORMAL LOW (ref 13.0–17.0)
Hemoglobin: 10.5 g/dL — ABNORMAL LOW (ref 13.0–17.0)
Hemoglobin: 9.5 g/dL — ABNORMAL LOW (ref 13.0–17.0)
Hemoglobin: 9.9 g/dL — ABNORMAL LOW (ref 13.0–17.0)
Hemoglobin: 9.5 g/dL — ABNORMAL LOW (ref 13.0–17.0)
Potassium: 3.9 mmol/L (ref 3.5–5.1)
Potassium: 4.4 mmol/L (ref 3.5–5.1)
Potassium: 5.1 mmol/L (ref 3.5–5.1)
Potassium: 6.4 mmol/L (ref 3.5–5.1)
Potassium: 6.1 mmol/L — ABNORMAL HIGH (ref 3.5–5.1)
Potassium: 6.5 mmol/L (ref 3.5–5.1)
Potassium: 5.4 mmol/L — ABNORMAL HIGH (ref 3.5–5.1)
Sodium: 138 mmol/L (ref 135–145)
Sodium: 136 mmol/L (ref 135–145)
Sodium: 135 mmol/L (ref 135–145)
Sodium: 136 mmol/L (ref 135–145)
Sodium: 131 mmol/L — ABNORMAL LOW (ref 135–145)
Sodium: 133 mmol/L — ABNORMAL LOW (ref 135–145)
Sodium: 133 mmol/L — ABNORMAL LOW (ref 135–145)
TCO2: 24 mmol/L (ref 22–32)
TCO2: 24 mmol/L (ref 22–32)
TCO2: 22 mmol/L (ref 22–32)
TCO2: 24 mmol/L (ref 22–32)
TCO2: 25 mmol/L (ref 22–32)
TCO2: 25 mmol/L (ref 22–32)
TCO2: 22 mmol/L (ref 22–32)

## 2022-07-30 LAB — POCT I-STAT 7, (LYTES, BLD GAS, ICA,H+H)
Acid-base deficit: 4 mmol/L — ABNORMAL HIGH (ref 0.0–2.0)
Acid-base deficit: 3 mmol/L — ABNORMAL HIGH (ref 0.0–2.0)
Acid-base deficit: 3 mmol/L — ABNORMAL HIGH (ref 0.0–2.0)
Acid-base deficit: 4 mmol/L — ABNORMAL HIGH (ref 0.0–2.0)
Acid-base deficit: 5 mmol/L — ABNORMAL HIGH (ref 0.0–2.0)
Acid-base deficit: 5 mmol/L — ABNORMAL HIGH (ref 0.0–2.0)
Acid-base deficit: 5 mmol/L — ABNORMAL HIGH (ref 0.0–2.0)
Acid-base deficit: 7 mmol/L — ABNORMAL HIGH (ref 0.0–2.0)
Acid-base deficit: 7 mmol/L — ABNORMAL HIGH (ref 0.0–2.0)
Bicarbonate: 22.1 mmol/L (ref 20.0–28.0)
Bicarbonate: 22.9 mmol/L (ref 20.0–28.0)
Bicarbonate: 23.6 mmol/L (ref 20.0–28.0)
Bicarbonate: 21.9 mmol/L (ref 20.0–28.0)
Bicarbonate: 22 mmol/L (ref 20.0–28.0)
Bicarbonate: 21.9 mmol/L (ref 20.0–28.0)
Bicarbonate: 20.6 mmol/L (ref 20.0–28.0)
Bicarbonate: 18.6 mmol/L — ABNORMAL LOW (ref 20.0–28.0)
Bicarbonate: 18 mmol/L — ABNORMAL LOW (ref 20.0–28.0)
Calcium, Ion: 1.33 mmol/L (ref 1.15–1.40)
Calcium, Ion: 1.08 mmol/L — ABNORMAL LOW (ref 1.15–1.40)
Calcium, Ion: 1.19 mmol/L (ref 1.15–1.40)
Calcium, Ion: 1.18 mmol/L (ref 1.15–1.40)
Calcium, Ion: 1.39 mmol/L (ref 1.15–1.40)
Calcium, Ion: 1.32 mmol/L (ref 1.15–1.40)
Calcium, Ion: 1.27 mmol/L (ref 1.15–1.40)
Calcium, Ion: 1.26 mmol/L (ref 1.15–1.40)
Calcium, Ion: 1.25 mmol/L (ref 1.15–1.40)
HCT: 38 % — ABNORMAL LOW (ref 39.0–52.0)
HCT: 26 % — ABNORMAL LOW (ref 39.0–52.0)
HCT: 30 % — ABNORMAL LOW (ref 39.0–52.0)
HCT: 29 % — ABNORMAL LOW (ref 39.0–52.0)
HCT: 28 % — ABNORMAL LOW (ref 39.0–52.0)
HCT: 31 % — ABNORMAL LOW (ref 39.0–52.0)
HCT: 28 % — ABNORMAL LOW (ref 39.0–52.0)
HCT: 29 % — ABNORMAL LOW (ref 39.0–52.0)
HCT: 28 % — ABNORMAL LOW (ref 39.0–52.0)
Hemoglobin: 12.9 g/dL — ABNORMAL LOW (ref 13.0–17.0)
Hemoglobin: 8.8 g/dL — ABNORMAL LOW (ref 13.0–17.0)
Hemoglobin: 10.2 g/dL — ABNORMAL LOW (ref 13.0–17.0)
Hemoglobin: 9.9 g/dL — ABNORMAL LOW (ref 13.0–17.0)
Hemoglobin: 9.5 g/dL — ABNORMAL LOW (ref 13.0–17.0)
Hemoglobin: 10.5 g/dL — ABNORMAL LOW (ref 13.0–17.0)
Hemoglobin: 9.5 g/dL — ABNORMAL LOW (ref 13.0–17.0)
Hemoglobin: 9.9 g/dL — ABNORMAL LOW (ref 13.0–17.0)
Hemoglobin: 9.5 g/dL — ABNORMAL LOW (ref 13.0–17.0)
O2 Saturation: 100 %
O2 Saturation: 100 %
O2 Saturation: 100 %
O2 Saturation: 100 %
O2 Saturation: 92 %
O2 Saturation: 98 %
O2 Saturation: 95 %
O2 Saturation: 96 %
O2 Saturation: 90 %
Patient temperature: 35.7
Patient temperature: 35.7
Patient temperature: 36.4
Patient temperature: 36.5
Potassium: 3.9 mmol/L (ref 3.5–5.1)
Potassium: 4.2 mmol/L (ref 3.5–5.1)
Potassium: 5.6 mmol/L — ABNORMAL HIGH (ref 3.5–5.1)
Potassium: 6.6 mmol/L (ref 3.5–5.1)
Potassium: 5.4 mmol/L — ABNORMAL HIGH (ref 3.5–5.1)
Potassium: 4.4 mmol/L (ref 3.5–5.1)
Potassium: 4.4 mmol/L (ref 3.5–5.1)
Potassium: 4.3 mmol/L (ref 3.5–5.1)
Potassium: 4.1 mmol/L (ref 3.5–5.1)
Sodium: 137 mmol/L (ref 135–145)
Sodium: 137 mmol/L (ref 135–145)
Sodium: 135 mmol/L (ref 135–145)
Sodium: 133 mmol/L — ABNORMAL LOW (ref 135–145)
Sodium: 133 mmol/L — ABNORMAL LOW (ref 135–145)
Sodium: 136 mmol/L (ref 135–145)
Sodium: 136 mmol/L (ref 135–145)
Sodium: 136 mmol/L (ref 135–145)
Sodium: 136 mmol/L (ref 135–145)
TCO2: 23 mmol/L (ref 22–32)
TCO2: 24 mmol/L (ref 22–32)
TCO2: 25 mmol/L (ref 22–32)
TCO2: 23 mmol/L (ref 22–32)
TCO2: 23 mmol/L (ref 22–32)
TCO2: 23 mmol/L (ref 22–32)
TCO2: 22 mmol/L (ref 22–32)
TCO2: 20 mmol/L — ABNORMAL LOW (ref 22–32)
TCO2: 19 mmol/L — ABNORMAL LOW (ref 22–32)
pCO2 arterial: 42.7 mmHg (ref 32–48)
pCO2 arterial: 41.7 mmHg (ref 32–48)
pCO2 arterial: 46.6 mmHg (ref 32–48)
pCO2 arterial: 42.8 mmHg (ref 32–48)
pCO2 arterial: 45.9 mmHg (ref 32–48)
pCO2 arterial: 46.2 mmHg (ref 32–48)
pCO2 arterial: 36 mmHg (ref 32–48)
pCO2 arterial: 34.9 mmHg (ref 32–48)
pCO2 arterial: 31.3 mmHg — ABNORMAL LOW (ref 32–48)
pH, Arterial: 7.321 — ABNORMAL LOW (ref 7.35–7.45)
pH, Arterial: 7.348 — ABNORMAL LOW (ref 7.35–7.45)
pH, Arterial: 7.312 — ABNORMAL LOW (ref 7.35–7.45)
pH, Arterial: 7.317 — ABNORMAL LOW (ref 7.35–7.45)
pH, Arterial: 7.287 — ABNORMAL LOW (ref 7.35–7.45)
pH, Arterial: 7.277 — ABNORMAL LOW (ref 7.35–7.45)
pH, Arterial: 7.358 (ref 7.35–7.45)
pH, Arterial: 7.332 — ABNORMAL LOW (ref 7.35–7.45)
pH, Arterial: 7.365 (ref 7.35–7.45)
pO2, Arterial: 263 mmHg — ABNORMAL HIGH (ref 83–108)
pO2, Arterial: 285 mmHg — ABNORMAL HIGH (ref 83–108)
pO2, Arterial: 303 mmHg — ABNORMAL HIGH (ref 83–108)
pO2, Arterial: 318 mmHg — ABNORMAL HIGH (ref 83–108)
pO2, Arterial: 73 mmHg — ABNORMAL LOW (ref 83–108)
pO2, Arterial: 121 mmHg — ABNORMAL HIGH (ref 83–108)
pO2, Arterial: 74 mmHg — ABNORMAL LOW (ref 83–108)
pO2, Arterial: 82 mmHg — ABNORMAL LOW (ref 83–108)
pO2, Arterial: 58 mmHg — ABNORMAL LOW (ref 83–108)

## 2022-07-30 LAB — CBC
HCT: 37.3 % — ABNORMAL LOW (ref 39.0–52.0)
HCT: 33.4 % — ABNORMAL LOW (ref 39.0–52.0)
HCT: 30.5 % — ABNORMAL LOW (ref 39.0–52.0)
Hemoglobin: 12.6 g/dL — ABNORMAL LOW (ref 13.0–17.0)
Hemoglobin: 11.2 g/dL — ABNORMAL LOW (ref 13.0–17.0)
Hemoglobin: 10.5 g/dL — ABNORMAL LOW (ref 13.0–17.0)
MCH: 31 pg (ref 26.0–34.0)
MCH: 31.5 pg (ref 26.0–34.0)
MCH: 31.1 pg (ref 26.0–34.0)
MCHC: 33.8 g/dL (ref 30.0–36.0)
MCHC: 33.5 g/dL (ref 30.0–36.0)
MCHC: 34.4 g/dL (ref 30.0–36.0)
MCV: 91.9 fL (ref 80.0–100.0)
MCV: 93.8 fL (ref 80.0–100.0)
MCV: 90.2 fL (ref 80.0–100.0)
Platelets: 160 10*3/uL (ref 150–400)
Platelets: 132 10*3/uL — ABNORMAL LOW (ref 150–400)
Platelets: 166 10*3/uL (ref 150–400)
RBC: 4.06 MIL/uL — ABNORMAL LOW (ref 4.22–5.81)
RBC: 3.56 MIL/uL — ABNORMAL LOW (ref 4.22–5.81)
RBC: 3.38 MIL/uL — ABNORMAL LOW (ref 4.22–5.81)
RDW: 12.4 % (ref 11.5–15.5)
RDW: 12.2 % (ref 11.5–15.5)
RDW: 12.1 % (ref 11.5–15.5)
WBC: 5.4 10*3/uL (ref 4.0–10.5)
WBC: 9.3 10*3/uL (ref 4.0–10.5)
WBC: 9.8 10*3/uL (ref 4.0–10.5)
nRBC: 0 % (ref 0.0–0.2)
nRBC: 0 % (ref 0.0–0.2)
nRBC: 0 % (ref 0.0–0.2)

## 2022-07-30 LAB — GLUCOSE, CAPILLARY
Glucose-Capillary: 128 mg/dL — ABNORMAL HIGH (ref 70–99)
Glucose-Capillary: 130 mg/dL — ABNORMAL HIGH (ref 70–99)
Glucose-Capillary: 144 mg/dL — ABNORMAL HIGH (ref 70–99)
Glucose-Capillary: 150 mg/dL — ABNORMAL HIGH (ref 70–99)
Glucose-Capillary: 142 mg/dL — ABNORMAL HIGH (ref 70–99)
Glucose-Capillary: 144 mg/dL — ABNORMAL HIGH (ref 70–99)
Glucose-Capillary: 181 mg/dL — ABNORMAL HIGH (ref 70–99)
Glucose-Capillary: 187 mg/dL — ABNORMAL HIGH (ref 70–99)
Glucose-Capillary: 181 mg/dL — ABNORMAL HIGH (ref 70–99)

## 2022-07-30 LAB — MAGNESIUM: Magnesium: 2.6 mg/dL — ABNORMAL HIGH (ref 1.7–2.4)

## 2022-07-30 LAB — PREPARE RBC (CROSSMATCH)

## 2022-07-30 LAB — HEMOGLOBIN AND HEMATOCRIT, BLOOD
HCT: 29.6 % — ABNORMAL LOW (ref 39.0–52.0)
Hemoglobin: 10.2 g/dL — ABNORMAL LOW (ref 13.0–17.0)

## 2022-07-30 LAB — BASIC METABOLIC PANEL
Anion gap: 7 (ref 5–15)
Anion gap: 12 (ref 5–15)
BUN: 17 mg/dL (ref 8–23)
BUN: 15 mg/dL (ref 8–23)
CO2: 23 mmol/L (ref 22–32)
CO2: 18 mmol/L — ABNORMAL LOW (ref 22–32)
Calcium: 9.3 mg/dL (ref 8.9–10.3)
Calcium: 9 mg/dL (ref 8.9–10.3)
Chloride: 107 mmol/L (ref 98–111)
Chloride: 107 mmol/L (ref 98–111)
Creatinine, Ser: 1.27 mg/dL — ABNORMAL HIGH (ref 0.61–1.24)
Creatinine, Ser: 1.16 mg/dL (ref 0.61–1.24)
GFR, Estimated: >60 mL/min (ref >60)
GFR, Estimated: >60 mL/min (ref >60)
Glucose, Bld: 98 mg/dL (ref 70–99)
Glucose, Bld: 138 mg/dL — ABNORMAL HIGH (ref 70–99)
Potassium: 3.6 mmol/L (ref 3.5–5.1)
Potassium: 4.3 mmol/L (ref 3.5–5.1)
Sodium: 137 mmol/L (ref 135–145)
Sodium: 137 mmol/L (ref 135–145)

## 2022-07-30 LAB — APTT: aPTT: 35 seconds (ref 24–36)

## 2022-07-30 LAB — ECHO INTRAOPERATIVE TEE
Height: 69 in
Weight: 3047.64 oz

## 2022-07-30 LAB — PLATELET COUNT: Platelets: 155 10*3/uL (ref 150–400)

## 2022-07-30 LAB — LIPOPROTEIN A (LPA): Lipoprotein (a): 23.5 nmol/L (ref <75.0)

## 2022-07-30 LAB — PROTIME-INR
INR: 1.2 (ref 0.8–1.2)
Prothrombin Time: 15.2 seconds (ref 11.4–15.2)

## 2022-07-30 LAB — HEPARIN LEVEL (UNFRACTIONATED): Heparin Unfractionated: 0.3 IU/mL (ref 0.30–0.70)

## 2022-07-30 SURGERY — CORONARY ARTERY BYPASS GRAFTING (CABG)
Anesthesia: General | Site: Chest

## 2022-07-30 MED ORDER — PROPOFOL 10 MG/ML IV BOLUS
INTRAVENOUS | Status: AC
  Filled 2022-07-30: qty 20

## 2022-07-30 MED ORDER — LEVALBUTEROL HCL 1.25 MG/0.5ML IN NEBU
1.2500 mg | INHALATION_SOLUTION | Freq: Four times a day (QID) | RESPIRATORY_TRACT | Status: DC
  Administered 2022-07-30 – 2022-07-31 (×3): 1.25 mg via RESPIRATORY_TRACT
  Filled 2022-07-30 (×3): qty 0.5

## 2022-07-30 MED ORDER — ORAL CARE MOUTH RINSE
15.0000 mL | OROMUCOSAL | Status: DC
  Administered 2022-07-30 (×3): 15 mL via OROMUCOSAL

## 2022-07-30 MED ORDER — LACTATED RINGERS IV SOLN: 500.0000 mL | Freq: Once | INTRAVENOUS | Status: DC | PRN

## 2022-07-30 MED ORDER — FAMOTIDINE IN NACL 20-0.9 MG/50ML-% IV SOLN
20.0000 mg | Freq: Two times a day (BID) | INTRAVENOUS | Status: DC
  Administered 2022-07-30: 20 mg via INTRAVENOUS
  Filled 2022-07-30: qty 50

## 2022-07-30 MED ORDER — MORPHINE SULFATE (PF) 2 MG/ML IV SOLN
1.0000 mg | INTRAVENOUS | Status: DC | PRN
  Administered 2022-07-30 (×2): 4 mg via INTRAVENOUS
  Administered 2022-07-30: 2 mg via INTRAVENOUS
  Administered 2022-07-30: 1 mg via INTRAVENOUS
  Administered 2022-07-30: 4 mg via INTRAVENOUS
  Administered 2022-07-30: 2 mg via INTRAVENOUS
  Administered 2022-07-31 (×3): 4 mg via INTRAVENOUS
  Filled 2022-07-30 (×3): qty 2
  Filled 2022-07-30: qty 1
  Filled 2022-07-30: qty 2
  Filled 2022-07-30: qty 1
  Filled 2022-07-30 (×2): qty 2
  Filled 2022-07-30: qty 1

## 2022-07-30 MED ORDER — DOCUSATE SODIUM 100 MG PO CAPS
200.0000 mg | ORAL_CAPSULE | Freq: Every day | ORAL | Status: DC
  Administered 2022-07-31 – 2022-08-05 (×5): 200 mg via ORAL
  Filled 2022-07-30 (×6): qty 2

## 2022-07-30 MED ORDER — ACETAMINOPHEN 160 MG/5ML PO SOLN: 650.0000 mg | Freq: Once | ORAL | Status: AC

## 2022-07-30 MED ORDER — CEFAZOLIN SODIUM-DEXTROSE 2-4 GM/100ML-% IV SOLN
2.0000 g | Freq: Three times a day (TID) | INTRAVENOUS | Status: DC
  Administered 2022-07-30 – 2022-07-31 (×4): 2 g via INTRAVENOUS
  Filled 2022-07-30 (×4): qty 100

## 2022-07-30 MED ORDER — PROTAMINE SULFATE 10 MG/ML IV SOLN
INTRAVENOUS | Status: AC
  Filled 2022-07-30: qty 5

## 2022-07-30 MED ORDER — BISACODYL 10 MG RE SUPP: 10.0000 mg | Freq: Every day | RECTAL | Status: DC

## 2022-07-30 MED ORDER — VANCOMYCIN HCL IN DEXTROSE 1-5 GM/200ML-% IV SOLN: 1000.0000 mg | Freq: Once | INTRAVENOUS | Status: DC

## 2022-07-30 MED ORDER — SODIUM CHLORIDE 0.45 % IV SOLN: INTRAVENOUS | Status: DC | PRN

## 2022-07-30 MED ORDER — EPHEDRINE 5 MG/ML INJ
INTRAVENOUS | Status: AC
  Filled 2022-07-30: qty 5

## 2022-07-30 MED ORDER — HEPARIN SODIUM (PORCINE) 1000 UNIT/ML IJ SOLN
INTRAMUSCULAR | Status: AC
  Filled 2022-07-30: qty 1

## 2022-07-30 MED ORDER — PROPOFOL 10 MG/ML IV BOLUS
INTRAVENOUS | Status: DC | PRN
  Administered 2022-07-30: 40 mg via INTRAVENOUS
  Administered 2022-07-30: 160 mg via INTRAVENOUS
  Administered 2022-07-30: 30 mg via INTRAVENOUS
  Administered 2022-07-30: 50 mg via INTRAVENOUS

## 2022-07-30 MED ORDER — SODIUM CHLORIDE (PF) 0.9 % IJ SOLN
OROMUCOSAL | Status: DC | PRN
  Administered 2022-07-30 (×3): 4 mL via TOPICAL

## 2022-07-30 MED ORDER — FENTANYL CITRATE (PF) 250 MCG/5ML IJ SOLN
INTRAMUSCULAR | Status: AC
  Filled 2022-07-30: qty 5

## 2022-07-30 MED ORDER — LACTATED RINGERS IV SOLN: INTRAVENOUS | Status: DC

## 2022-07-30 MED ORDER — SODIUM CHLORIDE 0.9% FLUSH: 3.0000 mL | INTRAVENOUS | Status: DC | PRN

## 2022-07-30 MED ORDER — PLASMA-LYTE A IV SOLN
INTRAVENOUS | Status: DC | PRN
  Administered 2022-07-30 (×2): 500 mL via INTRAVASCULAR

## 2022-07-30 MED ORDER — ONDANSETRON HCL 4 MG/2ML IJ SOLN
4.0000 mg | Freq: Four times a day (QID) | INTRAMUSCULAR | Status: DC | PRN
  Administered 2022-07-30 – 2022-07-31 (×2): 4 mg via INTRAVENOUS
  Filled 2022-07-30 (×2): qty 2

## 2022-07-30 MED ORDER — CHLORHEXIDINE GLUCONATE CLOTH 2 % EX PADS
6.0000 | MEDICATED_PAD | Freq: Every day | CUTANEOUS | Status: DC
  Administered 2022-07-30 – 2022-08-06 (×10): 6 via TOPICAL

## 2022-07-30 MED ORDER — LACTATED RINGERS IV SOLN: INTRAVENOUS | Status: DC | PRN

## 2022-07-30 MED ORDER — 0.9 % SODIUM CHLORIDE (POUR BTL) OPTIME
TOPICAL | Status: DC | PRN
  Administered 2022-07-30: 6000 mL

## 2022-07-30 MED ORDER — ROCURONIUM BROMIDE 10 MG/ML (PF) SYRINGE
PREFILLED_SYRINGE | INTRAVENOUS | Status: DC | PRN
  Administered 2022-07-30: 50 mg via INTRAVENOUS
  Administered 2022-07-30 (×2): 100 mg via INTRAVENOUS
  Administered 2022-07-30: 50 mg via INTRAVENOUS

## 2022-07-30 MED ORDER — PROTAMINE SULFATE 10 MG/ML IV SOLN
INTRAVENOUS | Status: DC | PRN
  Administered 2022-07-30: 310 mg via INTRAVENOUS

## 2022-07-30 MED ORDER — ASPIRIN 325 MG PO TBEC
325.0000 mg | DELAYED_RELEASE_TABLET | Freq: Every day | ORAL | Status: DC
  Administered 2022-07-31 – 2022-08-04 (×5): 325 mg via ORAL
  Filled 2022-07-30 (×6): qty 1

## 2022-07-30 MED ORDER — DEXMEDETOMIDINE HCL IN NACL 400 MCG/100ML IV SOLN
0.2000 ug/kg/h | INTRAVENOUS | Status: DC
  Administered 2022-07-30: 0.7 ug/kg/h via INTRAVENOUS
  Filled 2022-07-30: qty 100

## 2022-07-30 MED ORDER — ACETAMINOPHEN 500 MG PO TABS
1000.0000 mg | ORAL_TABLET | Freq: Four times a day (QID) | ORAL | Status: AC
  Administered 2022-07-31 – 2022-08-04 (×14): 1000 mg via ORAL
  Filled 2022-07-30 (×14): qty 2

## 2022-07-30 MED ORDER — VANCOMYCIN HCL IN DEXTROSE 1-5 GM/200ML-% IV SOLN
1000.0000 mg | Freq: Two times a day (BID) | INTRAVENOUS | Status: AC
  Administered 2022-07-30 – 2022-07-31 (×2): 1000 mg via INTRAVENOUS
  Filled 2022-07-30 (×2): qty 200

## 2022-07-30 MED ORDER — FENTANYL CITRATE (PF) 250 MCG/5ML IJ SOLN
INTRAMUSCULAR | Status: DC | PRN
  Administered 2022-07-30: 150 ug via INTRAVENOUS
  Administered 2022-07-30: 50 ug via INTRAVENOUS
  Administered 2022-07-30 (×2): 100 ug via INTRAVENOUS
  Administered 2022-07-30: 250 ug via INTRAVENOUS
  Administered 2022-07-30: 50 ug via INTRAVENOUS
  Administered 2022-07-30 (×3): 150 ug via INTRAVENOUS
  Administered 2022-07-30: 100 ug via INTRAVENOUS

## 2022-07-30 MED ORDER — HEMOSTATIC AGENTS (NO CHARGE) OPTIME
TOPICAL | Status: DC | PRN
  Administered 2022-07-30 (×3): 1 via TOPICAL

## 2022-07-30 MED ORDER — METOCLOPRAMIDE HCL 5 MG/ML IJ SOLN
10.0000 mg | Freq: Four times a day (QID) | INTRAMUSCULAR | Status: DC
  Administered 2022-07-30 – 2022-08-03 (×15): 10 mg via INTRAVENOUS
  Filled 2022-07-30 (×15): qty 2

## 2022-07-30 MED ORDER — HEPARIN SODIUM (PORCINE) 1000 UNIT/ML IJ SOLN
INTRAMUSCULAR | Status: DC | PRN
  Administered 2022-07-30: 3000 [IU] via INTRAVENOUS
  Administered 2022-07-30: 28000 [IU] via INTRAVENOUS

## 2022-07-30 MED ORDER — SODIUM CHLORIDE 0.9 % IV SOLN: 250.0000 mL | INTRAVENOUS | Status: DC

## 2022-07-30 MED ORDER — OXYCODONE HCL 5 MG PO TABS
5.0000 mg | ORAL_TABLET | ORAL | Status: DC | PRN
  Administered 2022-07-30 – 2022-08-02 (×10): 10 mg via ORAL
  Administered 2022-08-02: 5 mg via ORAL
  Administered 2022-08-02 (×2): 10 mg via ORAL
  Administered 2022-08-03 (×2): 5 mg via ORAL
  Administered 2022-08-04 – 2022-08-05 (×6): 10 mg via ORAL
  Filled 2022-07-30 (×6): qty 2
  Filled 2022-07-30: qty 1
  Filled 2022-07-30 (×6): qty 2
  Filled 2022-07-30: qty 1
  Filled 2022-07-30 (×4): qty 2
  Filled 2022-07-30: qty 1
  Filled 2022-07-30 (×2): qty 2

## 2022-07-30 MED ORDER — ALBUMIN HUMAN 5 % IV SOLN: INTRAVENOUS | Status: DC | PRN

## 2022-07-30 MED ORDER — ASPIRIN 81 MG PO CHEW
324.0000 mg | CHEWABLE_TABLET | Freq: Every day | ORAL | Status: DC
  Filled 2022-07-30: qty 4

## 2022-07-30 MED ORDER — PANTOPRAZOLE SODIUM 40 MG PO TBEC
40.0000 mg | DELAYED_RELEASE_TABLET | Freq: Every day | ORAL | Status: DC
  Administered 2022-08-01 – 2022-08-06 (×6): 40 mg via ORAL
  Filled 2022-07-30 (×6): qty 1

## 2022-07-30 MED ORDER — METOPROLOL TARTRATE 5 MG/5ML IV SOLN
2.5000 mg | INTRAVENOUS | Status: DC | PRN
  Administered 2022-08-02: 5 mg via INTRAVENOUS
  Filled 2022-07-30: qty 5

## 2022-07-30 MED ORDER — ONDANSETRON HCL 4 MG/2ML IJ SOLN
INTRAMUSCULAR | Status: AC
  Filled 2022-07-30: qty 2

## 2022-07-30 MED ORDER — METOPROLOL TARTRATE 12.5 MG HALF TABLET
12.5000 mg | ORAL_TABLET | Freq: Two times a day (BID) | ORAL | Status: DC
  Administered 2022-08-01 – 2022-08-06 (×11): 12.5 mg via ORAL
  Filled 2022-07-30 (×12): qty 1

## 2022-07-30 MED ORDER — MAGNESIUM SULFATE 4 GM/100ML IV SOLN
4.0000 g | Freq: Once | INTRAVENOUS | Status: AC
  Administered 2022-07-30: 4 g via INTRAVENOUS
  Filled 2022-07-30: qty 100

## 2022-07-30 MED ORDER — MIDAZOLAM HCL (PF) 5 MG/ML IJ SOLN
INTRAMUSCULAR | Status: DC | PRN
  Administered 2022-07-30 (×4): 2 mg via INTRAVENOUS
  Administered 2022-07-30 (×2): 1 mg via INTRAVENOUS

## 2022-07-30 MED ORDER — BISACODYL 5 MG PO TBEC
10.0000 mg | DELAYED_RELEASE_TABLET | Freq: Every day | ORAL | Status: DC
  Administered 2022-07-31 – 2022-08-02 (×2): 10 mg via ORAL
  Filled 2022-07-30 (×4): qty 2

## 2022-07-30 MED ORDER — SODIUM CHLORIDE 0.9 % IV SOLN
20.0000 ug | Freq: Once | INTRAVENOUS | Status: AC
  Administered 2022-07-30: 20 ug via INTRAVENOUS
  Filled 2022-07-30: qty 5

## 2022-07-30 MED ORDER — SODIUM CHLORIDE 0.9% IV SOLUTION: Freq: Once | INTRAVENOUS | Status: DC

## 2022-07-30 MED ORDER — PROTAMINE SULFATE 10 MG/ML IV SOLN
INTRAVENOUS | Status: AC
  Filled 2022-07-30: qty 25

## 2022-07-30 MED ORDER — MIDAZOLAM HCL (PF) 10 MG/2ML IJ SOLN
INTRAMUSCULAR | Status: AC
  Filled 2022-07-30: qty 2

## 2022-07-30 MED ORDER — INSULIN REGULAR(HUMAN) IN NACL 100-0.9 UT/100ML-% IV SOLN
INTRAVENOUS | Status: DC
  Administered 2022-07-30: 0.7 [IU]/h via INTRAVENOUS

## 2022-07-30 MED ORDER — EPHEDRINE SULFATE-NACL 50-0.9 MG/10ML-% IV SOSY
PREFILLED_SYRINGE | INTRAVENOUS | Status: DC | PRN
  Administered 2022-07-30 (×2): 5 mg via INTRAVENOUS

## 2022-07-30 MED ORDER — DEXTROSE 50 % IV SOLN: 0.0000 mL | INTRAVENOUS | Status: DC | PRN

## 2022-07-30 MED ORDER — PHENYLEPHRINE HCL-NACL 20-0.9 MG/250ML-% IV SOLN
0.0000 ug/min | INTRAVENOUS | Status: DC
  Administered 2022-07-31: 55 ug/min via INTRAVENOUS
  Administered 2022-07-31: 45 ug/min via INTRAVENOUS
  Filled 2022-07-30 (×2): qty 250

## 2022-07-30 MED ORDER — NITROGLYCERIN IN D5W 200-5 MCG/ML-% IV SOLN
0.0000 ug/min | INTRAVENOUS | Status: DC
  Filled 2022-07-30: qty 250

## 2022-07-30 MED ORDER — ACETAMINOPHEN 650 MG RE SUPP
650.0000 mg | Freq: Once | RECTAL | Status: AC
  Administered 2022-07-30: 650 mg via RECTAL

## 2022-07-30 MED ORDER — MIDAZOLAM HCL 2 MG/2ML IJ SOLN: 2.0000 mg | INTRAMUSCULAR | Status: DC | PRN

## 2022-07-30 MED ORDER — METOPROLOL TARTRATE 25 MG/10 ML ORAL SUSPENSION
12.5000 mg | Freq: Two times a day (BID) | ORAL | Status: DC
  Filled 2022-07-30: qty 10
  Filled 2022-07-30 (×2): qty 5

## 2022-07-30 MED ORDER — TRAMADOL HCL 50 MG PO TABS
50.0000 mg | ORAL_TABLET | ORAL | Status: DC | PRN
  Administered 2022-07-30 – 2022-08-02 (×5): 100 mg via ORAL
  Administered 2022-08-02: 50 mg via ORAL
  Administered 2022-08-03 – 2022-08-05 (×2): 100 mg via ORAL
  Filled 2022-07-30 (×4): qty 2
  Filled 2022-07-30: qty 1
  Filled 2022-07-30 (×3): qty 2

## 2022-07-30 MED ORDER — SODIUM CHLORIDE 0.9% FLUSH
3.0000 mL | Freq: Two times a day (BID) | INTRAVENOUS | Status: DC
  Administered 2022-07-31 – 2022-08-05 (×7): 3 mL via INTRAVENOUS

## 2022-07-30 MED ORDER — ORAL CARE MOUTH RINSE: 15.0000 mL | OROMUCOSAL | Status: DC | PRN

## 2022-07-30 MED ORDER — CHLORHEXIDINE GLUCONATE 0.12 % MT SOLN
15.0000 mL | OROMUCOSAL | Status: AC
  Administered 2022-07-30: 15 mL via OROMUCOSAL
  Filled 2022-07-30: qty 15

## 2022-07-30 MED ORDER — ACETAMINOPHEN 160 MG/5ML PO SOLN
1000.0000 mg | Freq: Four times a day (QID) | ORAL | Status: AC
  Administered 2022-07-30 – 2022-07-31 (×2): 1000 mg
  Filled 2022-07-30 (×2): qty 40.6

## 2022-07-30 MED ORDER — POTASSIUM CHLORIDE 10 MEQ/50ML IV SOLN: 10.0000 meq | INTRAVENOUS | Status: AC

## 2022-07-30 MED ORDER — ALBUMIN HUMAN 5 % IV SOLN
250.0000 mL | INTRAVENOUS | Status: AC | PRN
  Administered 2022-07-30 (×3): 12.5 g via INTRAVENOUS
  Filled 2022-07-30: qty 250

## 2022-07-30 MED ORDER — MILRINONE LACTATE IN DEXTROSE 20-5 MG/100ML-% IV SOLN
0.1250 ug/kg/min | INTRAVENOUS | Status: DC
  Administered 2022-07-31: 0.25 ug/kg/min via INTRAVENOUS
  Administered 2022-08-01 (×2): 0.125 ug/kg/min via INTRAVENOUS
  Filled 2022-07-30 (×3): qty 100

## 2022-07-30 MED ORDER — SODIUM CHLORIDE 0.9 % IV SOLN: INTRAVENOUS | Status: DC

## 2022-07-30 SURGICAL SUPPLY — 111 items
ADAPTER CARDIO PERF ANTE/RETRO (ADAPTER) ×2 IMPLANT
ADAPTER MULTI PERFUSION 15 (ADAPTER) IMPLANT
ADH SKN CLS APL DERMABOND .7 (GAUZE/BANDAGES/DRESSINGS) ×2
ADPR PRFSN 84XANTGRD RTRGD (ADAPTER) ×2
AGENT HMST KT MTR STRL THRMB (HEMOSTASIS) ×2
BAG DECANTER FOR FLEXI CONT (MISCELLANEOUS) ×2 IMPLANT
BLADE CLIPPER SURG (BLADE) ×2 IMPLANT
BLADE STERNUM SYSTEM 6 (BLADE) ×2 IMPLANT
BLADE SURG 12 STRL SS (BLADE) ×2 IMPLANT
BNDG CMPR MED 10X6 ELC LF (GAUZE/BANDAGES/DRESSINGS) ×2
BNDG ELASTIC 4X5.8 VLCR STR LF (GAUZE/BANDAGES/DRESSINGS) ×2 IMPLANT
BNDG ELASTIC 6X10 VLCR STRL LF (GAUZE/BANDAGES/DRESSINGS) IMPLANT
BNDG ELASTIC 6X5.8 VLCR STR LF (GAUZE/BANDAGES/DRESSINGS) ×2 IMPLANT
BNDG GAUZE DERMACEA FLUFF 4 (GAUZE/BANDAGES/DRESSINGS) ×2 IMPLANT
BNDG GZE DERMACEA 4 6PLY (GAUZE/BANDAGES/DRESSINGS) ×2
CANISTER SUCT 3000ML PPV (MISCELLANEOUS) ×2 IMPLANT
CANNULA AORTIC ROOT 9FR (CANNULA) IMPLANT
CANNULA ARTERIAL NVNT 3/8 20FR (MISCELLANEOUS) IMPLANT
CANNULA CARDIOPLEGIA 14FRX32 (CANNULA) IMPLANT
CANNULA GUNDRY RCSP 15FR (MISCELLANEOUS) ×2 IMPLANT
CANNULA MC2 2 STG 36/46 NON-V (CANNULA) IMPLANT
CANNULA VENOUS 2 STG 34/46 (CANNULA) ×2
CATH CPB KIT VANTRIGT (MISCELLANEOUS) ×2 IMPLANT
CATH ROBINSON RED A/P 18FR (CATHETERS) ×6 IMPLANT
CATH THORACIC 28FR RT ANG (CATHETERS) ×2 IMPLANT
CLIP TI WIDE RED SMALL 24 (CLIP) IMPLANT
CONTAINER PROTECT SURGISLUSH (MISCELLANEOUS) ×4 IMPLANT
COVER SURGICAL LIGHT HANDLE (MISCELLANEOUS) IMPLANT
DERMABOND ADVANCED .7 DNX12 (GAUZE/BANDAGES/DRESSINGS) IMPLANT
DRAIN CHANNEL 32F RND 10.7 FF (WOUND CARE) ×2 IMPLANT
DRAPE CARDIOVASCULAR INCISE (DRAPES) ×2
DRAPE SLUSH/WARMER DISC (DRAPES) ×2 IMPLANT
DRAPE SRG 135X102X78XABS (DRAPES) ×2 IMPLANT
DRSG AQUACEL AG ADV 3.5X14 (GAUZE/BANDAGES/DRESSINGS) ×2 IMPLANT
DRSG COVADERM 4X14 (GAUZE/BANDAGES/DRESSINGS) IMPLANT
ELECT BLADE 4.0 EZ CLEAN MEGAD (MISCELLANEOUS) ×2
ELECT BLADE 6.5 EXT (BLADE) ×2 IMPLANT
ELECT CAUTERY BLADE 6.4 (BLADE) ×2 IMPLANT
ELECT REM PT RETURN 9FT ADLT (ELECTROSURGICAL) ×4
ELECTRODE BLDE 4.0 EZ CLN MEGD (MISCELLANEOUS) ×2 IMPLANT
ELECTRODE REM PT RTRN 9FT ADLT (ELECTROSURGICAL) ×4 IMPLANT
FELT TEFLON 1X6 (MISCELLANEOUS) ×4 IMPLANT
GAUZE 4X4 16PLY ~~LOC~~+RFID DBL (SPONGE) ×2 IMPLANT
GAUZE SPONGE 4X4 12PLY STRL (GAUZE/BANDAGES/DRESSINGS) ×4 IMPLANT
GLOVE BIO SURGEON STRL SZ 6.5 (GLOVE) IMPLANT
GLOVE BIO SURGEON STRL SZ7.5 (GLOVE) ×6 IMPLANT
GLOVE BIOGEL PI IND STRL 6.5 (GLOVE) IMPLANT
GLOVE SS BIOGEL STRL SZ 7.5 (GLOVE) IMPLANT
GLOVE SURG SS PI 6.0 STRL IVOR (GLOVE) IMPLANT
GOWN STRL REUS W/ TWL LRG LVL3 (GOWN DISPOSABLE) ×8 IMPLANT
GOWN STRL REUS W/TWL LRG LVL3 (GOWN DISPOSABLE) ×14
HEMOSTAT POWDER SURGIFOAM 1G (HEMOSTASIS) ×6 IMPLANT
HEMOSTAT SURGICEL 2X14 (HEMOSTASIS) ×2 IMPLANT
INSERT FOGARTY XLG (MISCELLANEOUS) IMPLANT
KIT BASIN OR (CUSTOM PROCEDURE TRAY) ×2 IMPLANT
KIT SUCTION CATH 14FR (SUCTIONS) ×2 IMPLANT
KIT TURNOVER KIT B (KITS) ×2 IMPLANT
KIT VASOVIEW HEMOPRO 2 VH 4000 (KITS) ×2 IMPLANT
LEAD PACING MYOCARDI (MISCELLANEOUS) ×2 IMPLANT
MARKER GRAFT CORONARY BYPASS (MISCELLANEOUS) ×6 IMPLANT
NS IRRIG 1000ML POUR BTL (IV SOLUTION) ×10 IMPLANT
PACK E OPEN HEART (SUTURE) ×2 IMPLANT
PACK OPEN HEART (CUSTOM PROCEDURE TRAY) ×2 IMPLANT
PAD ARMBOARD 7.5X6 YLW CONV (MISCELLANEOUS) ×4 IMPLANT
PAD ELECT DEFIB RADIOL ZOLL (MISCELLANEOUS) ×2 IMPLANT
PENCIL BUTTON HOLSTER BLD 10FT (ELECTRODE) ×2 IMPLANT
POSITIONER HEAD DONUT 9IN (MISCELLANEOUS) ×2 IMPLANT
POWDER SURGICEL 3.0 GRAM (HEMOSTASIS) ×2 IMPLANT
PUNCH AORTIC ROTATE 4.0MM (MISCELLANEOUS) IMPLANT
PUNCH AORTIC ROTATE 4.5MM 8IN (MISCELLANEOUS) IMPLANT
PUNCH AORTIC ROTATE 5MM 8IN (MISCELLANEOUS) IMPLANT
SET CARDIOPLEGIA MPS 5001102 (MISCELLANEOUS) IMPLANT
SPONGE T-LAP 18X18 ~~LOC~~+RFID (SPONGE) ×8 IMPLANT
SPONGE T-LAP 4X18 ~~LOC~~+RFID (SPONGE) ×2 IMPLANT
SUPPORT HEART JANKE-BARRON (MISCELLANEOUS) ×2 IMPLANT
SURGIFLO W/THROMBIN 8M KIT (HEMOSTASIS) ×2 IMPLANT
SUT BONE WAX W31G (SUTURE) ×2 IMPLANT
SUT MNCRL AB 4-0 PS2 18 (SUTURE) IMPLANT
SUT PROLENE 3 0 SH DA (SUTURE) IMPLANT
SUT PROLENE 3 0 SH1 36 (SUTURE) IMPLANT
SUT PROLENE 4 0 RB 1 (SUTURE) ×6
SUT PROLENE 4 0 SH DA (SUTURE) ×2 IMPLANT
SUT PROLENE 4-0 RB1 .5 CRCL 36 (SUTURE) ×2 IMPLANT
SUT PROLENE 4-0 RB1 18X2 ARM (SUTURE) IMPLANT
SUT PROLENE 5 0 C 1 36 (SUTURE) IMPLANT
SUT PROLENE 6 0 C 1 30 (SUTURE) IMPLANT
SUT PROLENE 6 0 CC (SUTURE) ×6 IMPLANT
SUT PROLENE 8 0 BV175 6 (SUTURE) IMPLANT
SUT PROLENE BLUE 7 0 (SUTURE) ×2 IMPLANT
SUT SILK  1 MH (SUTURE)
SUT SILK 1 MH (SUTURE) IMPLANT
SUT SILK 2 0 SH CR/8 (SUTURE) IMPLANT
SUT SILK 3 0 SH CR/8 (SUTURE) IMPLANT
SUT STEEL 6MS V (SUTURE) ×4 IMPLANT
SUT STEEL SZ 6 DBL 3X14 BALL (SUTURE) ×2 IMPLANT
SUT VIC AB 1 CTX 36 (SUTURE) ×4
SUT VIC AB 1 CTX36XBRD ANBCTR (SUTURE) ×4 IMPLANT
SUT VIC AB 2-0 CT1 27 (SUTURE) ×2
SUT VIC AB 2-0 CT1 TAPERPNT 27 (SUTURE) IMPLANT
SUT VIC AB 2-0 CTX 27 (SUTURE) IMPLANT
SUT VIC AB 3-0 X1 27 (SUTURE) IMPLANT
SYSTEM SAHARA CHEST DRAIN ATS (WOUND CARE) ×2 IMPLANT
TAPE CLOTH 4X10 WHT NS (GAUZE/BANDAGES/DRESSINGS) IMPLANT
TAPE PAPER 2X10 WHT MICROPORE (GAUZE/BANDAGES/DRESSINGS) IMPLANT
TOWEL GREEN STERILE (TOWEL DISPOSABLE) ×2 IMPLANT
TOWEL GREEN STERILE FF (TOWEL DISPOSABLE) ×2 IMPLANT
TRAY FOLEY SLVR 16FR TEMP STAT (SET/KITS/TRAYS/PACK) ×2 IMPLANT
TUBE SUCTION CARDIAC 10FR (CANNULA) IMPLANT
TUBING LAP HI FLOW INSUFFLATIO (TUBING) ×2 IMPLANT
UNDERPAD 30X36 HEAVY ABSORB (UNDERPADS AND DIAPERS) ×2 IMPLANT
WATER STERILE IRR 1000ML POUR (IV SOLUTION) ×4 IMPLANT

## 2022-07-30 NOTE — Brief Op Note (Signed)
07/28/2022 - 07/30/2022  11:44 AM  PATIENT:  Carl [name]  66 y.o. male  PRE-OPERATIVE DIAGNOSIS:  coronary artery disease  POST-OPERATIVE DIAGNOSIS:  coronary artery disease  PROCEDURE:  Procedure(s) with comments: CORONARY ARTERY BYPASS GRAFTING (CABG) ([name]) - CABG x3; Left IMA, Right leg greater ESVH TRANSESOPHAGEAL ECHOCARDIOGRAM (TEE) ([name]) Vein harvest time: 43mn Vein prep time: 122m Free LIMA-LAD SVG-PDA SVG-diagonal  SURGEON:  Surgeon(s) and Role:    * Dahlia ByesMD - Primary  PHYSICIAN ASSISTANT: WAYNE GOLD PA-C  ASSISTANTS: RNFA-staff  ANESTHESIA:   general  EBL: 400 cc  BLOOD ADMINISTERED:none  DRAINS: Left pleural and mediastinal chest tubes   LOCAL MEDICATIONS USED:  NONE  SPECIMEN:  No Specimen  DISPOSITION OF SPECIMEN:  [name]  COUNTS:  YES  TOURNIQUET:  * No tourniquets in log *  DICTATION: .Other Dictation: Dictation Number pending  PLAN OF CARE: Admit to inpatient   PATIENT DISPOSITION:  ICU - intubated and hemodynamically stable.   Delay start of Pharmacological VTE agent (>24hrs) due to surgical blood loss or risk of bleeding: yes  Complications: No known

## 2022-07-30 NOTE — Transfer of Care (Signed)
Immediate Anesthesia Transfer of Care Note  Patient: Carl [name]  Procedure(s) Performed: CORONARY ARTERY BYPASS GRAFTING (CABG) (Chest) TRANSESOPHAGEAL ECHOCARDIOGRAM (TEE)  Patient Location: ICU  Anesthesia Type:General  Level of Consciousness: Patient remains intubated per anesthesia plan  Airway & Oxygen Therapy: Patient remains intubated per anesthesia plan and Patient placed on Ventilator (see vital sign flow sheet for setting)  Post-op Assessment: Report given to RN and Post -op Vital signs reviewed and stable  Post vital signs: Reviewed and stable  Last Vitals:  Vitals Value Taken Time  BP    Temp 35.8 C 07/30/22 1411  Pulse 89 07/30/22 1411  Resp 12 07/30/22 1411  SpO2 99 % 07/30/22 1411  Vitals shown include unvalidated device data.  Last Pain:  Vitals:   07/30/22 0517  TempSrc: Oral  PainSc:       Patients Stated Pain Goal: 0 (50/27/74 1287)  Complications: No notable events documented.

## 2022-07-30 NOTE — Progress Notes (Signed)
      [address]       [place] [address]             [phone]      S/p CABG  Intubated, awake and weaning vent  BP (!) 155/75 (BP Location: Left Arm)   Pulse 90   Temp (!) 97.3 F (36.3 C)   Resp (!) 23   Ht '5\' 9"'$  (1.753 m)   Wt 86.4 kg   SpO2 96%   BMI 28.13 kg/m  24/16 CI 2.75  Intake/Output Summary (Last 24 hours) at 07/30/2022 1750 Last data filed at 07/30/2022 1700 Gross per 24 hour  Intake 3990.53 ml  Output 2940 ml  Net 1050.53 ml   K 4.4 Hgb 28  Doing well early postop Excellent hemodynamics, minimal CT output  Carl Lipps C. Roxan Hockey, MD [facility] [phone]

## 2022-07-30 NOTE — Hospital Course (Addendum)
  Referring: No ref. provider found Primary Care: Vicenta Aly, [place] [place] [facility], MD History of Present Illness:    The patient is a 66 year old male we are asked to see in CT surgical consultation for consideration of coronary artery surgical revascularization.  The patient's cardiac history goes back to 2011 by catheterization where he was found to have nonobstructive CAD with potential component of vasospasm.  He does have several known risk cardiac factors including hypertension, tobacco abuse, previous CVA, peripheral arterial disease, hyperlipidemia and aortic dilatation.  At the time in 2011 he was treated medically.  He recently presented for follow-up with complaints of intermittent chest pain that was being relieved with sublingual nitroglycerin.  Imdur was added to his medical regimen at that time.  A stress test was done in October 2023 which was read as low risk for ischemia.  2D echocardiogram showed EF of 60 to 65% with grade 1 diastolic dysfunction, normal RV, mild dilatation of the aortic root and borderline dilatation of the ascending aorta.  He has undergone noninvasive peripheral vascular arterial studies suggesting diffuse bilateral PAD.  He does have significant claudication.  He represented on follow-up yesterday with continued reports of chest pain.  The nature of the symptoms have been progressive over the past 4 months.  Currently they are with minimal exertion.  He does not have shortness of breath associated but some uneasy sensation whenever he tries to do activity.  Sublingual nitroglycerin does continue to ease the discomfort.  On yesterday's presentation he had a prolonged episode requiring for sublingual nitroglycerin before he felt improved.  He almost called EMS but opted not to.  He is a former 2 pack/day smoker but has cut back to 1 pack/day.  Following this presentation in the office he was sent to the emergency department.  EKG showed some  diffuse ST-T wave changes in leads I, 2, aVL, V4-V6 which were similar to prior.  In the emergency department his high-sensitivity troponins were elevated.  He was admitted for further medical stabilization and plans to proceed with cardiac catheterization. Cardiac catheterization reveals severely heavily calcified two-vessel CAD.  We see the full report below.  He has full echocardiogram report is also below.  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  His stroke left no significant residual deficits.   Hospital course: The patient and all relevant studies were reviewed by Dr. Darcey Nora who recommended CABG.  The patient continued to be medically stabilized and was felt to be able to proceed on 07/30/2022.  He was taken the operating room and underwent coronary artery bypass grafting x3 with a free LIMA to LAD, SVG-diagonal, SVG-PDA.  The patient tolerated procedure well was taken to the surgical intensive care unit in stable condition.

## 2022-07-30 NOTE — Procedures (Signed)
Extubation Procedure Note  Patient Details:   Name: Carl [name] DOB: 1955-11-27 MRN: [id number]   Airway Documentation:    Vent end date: 07/30/22 Vent end time: 1807   Evaluation  O2 sats: stable throughout Complications: No apparent complications Patient did tolerate procedure well. Bilateral Breath Sounds: Other (Comment) (coarse)   Yes  Pt extubated to 2L [place], pt tolerated well. Cuff leak present, no stridor noted, RN at bedside, RT will monitor.   Carren Rang 07/30/2022, 6:26 PM

## 2022-07-30 NOTE — Anesthesia Procedure Notes (Signed)
Arterial Line Insertion Start/End11/05/2022 7:00 AM, 07/30/2022 7:04 AM Performed by: Inda Coke, CRNA, CRNA  Preanesthetic checklist: patient identified, IV checked, site marked, risks and benefits discussed, surgical consent, monitors and equipment checked, pre-op evaluation, timeout performed and anesthesia consent Lidocaine 1% used for infiltration Left, radial was placed Catheter size: 20 G Hand hygiene performed  and maximum sterile barriers used  Allen's test indicative of satisfactory collateral circulation Attempts: 1 Procedure performed without using ultrasound guided technique. Following insertion, dressing applied and Biopatch. Post procedure assessment: normal  Patient tolerated the procedure well with no immediate complications.

## 2022-07-30 NOTE — Anesthesia Procedure Notes (Signed)
Central Venous Catheter Insertion Performed by: Duane Boston, MD, anesthesiologist Start/End11/05/2022 7:08 AM, 07/30/2022 7:18 AM Patient location: Pre-op. Preanesthetic checklist: patient identified, IV checked, site marked, risks and benefits discussed, surgical consent, monitors and equipment checked, pre-op evaluation, timeout performed and anesthesia consent Position: Trendelenburg Lidocaine 1% used for infiltration and patient sedated Hand hygiene performed , maximum sterile barriers used  and Seldinger technique used Catheter size: 8.5 Fr Total catheter length 8. PA cath was placed.Sheath introducer Swan type:thermodilution PA Cath depth:50 Procedure performed using ultrasound guided technique. Ultrasound Notes:anatomy identified, needle tip was noted to be adjacent to the nerve/plexus identified, no ultrasound evidence of intravascular and/or intraneural injection and image(s) printed for medical record Attempts: 1 Following insertion, line sutured and dressing applied. Post procedure assessment: free fluid flow, blood return through all ports and no air  Patient tolerated the procedure well with no immediate complications.

## 2022-07-30 NOTE — Progress Notes (Signed)
Pre Procedure note for inpatients:   Carl [name] has been scheduled for Procedure(s): CORONARY ARTERY BYPASS GRAFTING (CABG) ([name]) TRANSESOPHAGEAL ECHOCARDIOGRAM (TEE) ([name]) today. The various methods of treatment have been discussed with the patient. After consideration of the risks, benefits and treatment options the patient has consented to the planned procedure.   The patient has been seen and labs reviewed. There are no changes in the patient's condition to prevent proceeding with the planned procedure today.  Recent labs:  Lab Results  Component Value Date   WBC 5.4 07/30/2022   HGB 12.6 (L) 07/30/2022   HCT 37.3 (L) 07/30/2022   PLT 160 07/30/2022   GLUCOSE 98 07/30/2022   CHOL 122 07/29/2022   TRIG 131 07/29/2022   HDL 31 (L) 07/29/2022   LDLCALC 65 07/29/2022   ALT 20 07/29/2022   AST 28 07/29/2022   NA 137 07/30/2022   K 3.6 07/30/2022   CL 107 07/30/2022   CREATININE 1.27 (H) 07/30/2022   BUN 17 07/30/2022   CO2 23 07/30/2022   TSH 0.392 07/29/2022   INR 1.0 07/29/2022   HGBA1C 5.3 07/28/2022    [name], MD 07/30/2022 7:26 AM

## 2022-07-30 NOTE — Op Note (Signed)
Carl [name], [name]: [id number] ACCOUNT NO: [id number] DATE OF BIRTH: June 14, 1956 FACILITY: [facility] LOCATION: [facility] PHYSICIAN: Ivin Poot III, MD  Operative Report   DATE OF PROCEDURE: 07/30/2022  OPERATION:   1.  Coronary artery bypass grafting x3 (left internal mammary artery to LAD using a free mammary graft, saphenous vein graft to diagonal, saphenous vein graft to posterior descending). 2.  Endoscopic harvest of right leg greater saphenous vein.  SURGEON:  Tharon Aquas Trigt III, MD  ASSISTANT:  Jadene Pierini, PA-C  A surgical first assistant was needed for this operation due to the complexity of the procedure and the standard of care for cardiac surgery at this hospital.  The first assistant was needed to harvest the vein conduit from the leg with endoscopic  technique and closure of the leg incisions.  The first assistant was needed with the distal anastomoses for suture management, suctioning, exposure, and general assistance.  PREOPERATIVE DIAGNOSES:   1.  Non-ST elevation myocardial infarction.  2.  Severe multivessel coronary artery disease. 3.  Left subclavian stenosis.  POSTOPERATIVE DIAGNOSES:   1.  Non-ST elevation myocardial infarction.  2.  Severe multivessel coronary artery disease. 3.  Left subclavian stenosis.  DESCRIPTION OF PROCEDURE:  The patient was examined in preoperative holding where informed consent was documented and the procedure was again reviewed with the patient and all questions were addressed.  The patient was brought to the operating room and  placed supine on the operating room table.  Under invasive hemodynamic monitoring, the patient was induced for general anesthesia and intubated.  He remained stable.  A transesophageal echo probe was placed by the anesthesia team.  A Foley catheter was  placed and the patient was prepped and draped as a sterile field.  A proper timeout was performed.  A sternal incision was made as the  saphenous vein was harvested endoscopically from the right leg.  The left internal mammary artery was harvested as a  free graft for bypass to the LAD.  The blood flow was somewhat limited as the blood pressure in the left arm versus the right arm throughout the case was consistently at least 20 mmHg lower.  The pericardium was then opened and a retractor was placed.  Pursestrings were placed in the ascending aorta and right atrium and after the vein was harvested, heparin was administered and ACT was documented as being therapeutic.  The patient was  cannulated and placed on cardiopulmonary bypass.  The coronaries were identified for grafting and included the distal LAD, which is heavily calcified proximally, the diagonal, which was a smaller vessel and heavily calcified and stenotic proximally, and  the large RCA, which was heavily calcified and the anastomosis was done distally in the posterior descending.  Cardioplegia cannulas were placed for both antegrade and retrograde cold blood cardioplegia and the patient was cooled to 32 degrees.  The  aortic crossclamp was applied and 1 liter of cold blood cardioplegia was delivered in split doses between the antegrade aortic and retrograde coronary sinus catheters.  There was good cardioplegic arrest and supple temperature drop less than 12 degrees.   Cardioplegia was delivered every 20 minutes while the crossclamp was in place.  The distal coronary anastomoses were performed.  The first distal anastomosis was to the posterior descending.  This was a heavily diseased thickened vessel with proximal 99% stenosis.  A reverse saphenous vein was sewn end-to-side with running 7-0  Prolene with good flow through the graft.  Cardioplegia was redosed.  The second distal anastomosis was the diagonal branch of LAD.  This was a smaller 1.2 mm vessel with proximal 90% stenosis.  A reverse saphenous vein was sewn end-to-side with running 7-0 Prolene and there was good  flow through the graft.  Cardioplegia  was redosed.  The third distal anastomosis was the distal LAD beyond the heavy long segment of severe calcification.  The free mammary was sewn end-to-side with running 8-0 Prolene and there was good flow through the graft.  Cardioplegia was redosed.  While the crossclamp was still in place, 2 proximal vein anastomoses were performed using a 4.5 mm punch and running 6-0 Prolene.  The proximal anastomosis of the mammary graft was placed to the hood of the vein graft to the diagonal using running 7-0  Prolene in an end-to-side anastomosis.  Prior to releasing the crossclamp, air was vented from the coronaries with a dose of retrograde warm blood cardioplegia - hot shot.  The heart resumed a spontaneous rhythm.  The vein grafts were de-aired and opened and each had good flow and hemostasis was documented at the proximal and distal sites.  The patient was rewarmed and reperfused.  Temporary pacing wires were applied.  The  cardioplegia cannulas were removed.  The lungs were reexpanded and the ventilator was resumed.  The patient was then weaned from cardiopulmonary bypass on low dose milrinone.  Echo showed preserved LV, normal systolic function.  Cannulas were removed.   Protamine was administered without adverse reaction.  There was improvement in hemostasis after application of the protamine.  The superior pericardial fat was closed over the aorta and vein grafts.  An anterior mediastinal and left pleural chest tubes  were placed and brought out through separate incisions.  The sternum was closed with wire.  The pectoralis fascia was closed with a running Vicryl.  The patient remained stable.  Subcutaneous and skin layers were closed with a running Vicryl and sterile  dressings were applied.  The patient was then taken back to the ICU after chest x-ray was taken, which showed the ET tube and chest tubes to be in good position.  The PA catheter was somewhat extended  into the right pulmonary artery and this was  withdrawn.  Total bypass time was 151 minutes.   VAI D: 07/30/2022 3:50:29 pm T: 07/30/2022 11:28:00 pm  JOB: [id number]/ [id number]

## 2022-07-30 NOTE — Progress Notes (Addendum)
Pt achieved a NIF of -30 and VC of 1.1 with good pt effort on all attempts. Pt tolerated well, RN at bedside.

## 2022-07-30 NOTE — Anesthesia Postprocedure Evaluation (Signed)
Anesthesia Post Note  Patient: AYDN FERRARA  Procedure(s) Performed: CORONARY ARTERY BYPASS GRAFTING (CABG) (Chest) TRANSESOPHAGEAL ECHOCARDIOGRAM (TEE)     Patient location during evaluation: PACU Anesthesia Type: General Level of consciousness: sedated Pain management: pain level controlled Vital Signs Assessment: post-procedure vital signs reviewed and stable Respiratory status: spontaneous breathing and respiratory function stable Cardiovascular status: stable Postop Assessment: no apparent nausea or vomiting Anesthetic complications: no   No notable events documented.  Last Vitals:  Vitals:   07/30/22 1415 07/30/22 1430  BP:    Pulse: 89 89  Resp: 12 12  Temp: (!) 35.8 C (!) 35.6 C  SpO2: 99% 100%    Last Pain:  Vitals:   07/30/22 0517  TempSrc: Oral  PainSc:                  Jemila Camille DANIEL

## 2022-07-30 NOTE — Anesthesia Procedure Notes (Signed)
Procedure Name: Intubation Date/Time: 07/30/2022 7:51 AM  Performed by: Inda Coke, CRNAPre-anesthesia Checklist: Patient identified, Emergency Drugs available, Suction available, Timeout performed and Patient being monitored Patient Re-evaluated:Patient Re-evaluated prior to induction Oxygen Delivery Method: Circle system utilized Preoxygenation: Pre-oxygenation with 100% oxygen Induction Type: IV induction Ventilation: Mask ventilation without difficulty and Oral airway inserted - appropriate to patient size Laryngoscope Size: Mac and 4 Grade View: Grade I Tube type: Oral Tube size: 8.0 mm Number of attempts: 1 Airway Equipment and Method: Stylet Placement Confirmation: ETT inserted through vocal cords under direct vision, positive ETCO2, CO2 detector and breath sounds checked- equal and bilateral Secured at: 23 cm Tube secured with: Tape Dental Injury: Teeth and Oropharynx as per pre-operative assessment

## 2022-07-31 ENCOUNTER — Inpatient Hospital Stay (HOSPITAL_COMMUNITY): Payer: Medicare HMO

## 2022-07-31 ENCOUNTER — Inpatient Hospital Stay: Payer: Self-pay

## 2022-07-31 ENCOUNTER — Encounter (HOSPITAL_COMMUNITY): Payer: Self-pay | Admitting: Cardiothoracic Surgery

## 2022-07-31 DIAGNOSIS — I200 Unstable angina: Secondary | ICD-10-CM | POA: Diagnosis not present

## 2022-07-31 LAB — POCT I-STAT 7, (LYTES, BLD GAS, ICA,H+H)
Acid-base deficit: 8 mmol/L — ABNORMAL HIGH (ref 0.0–2.0)
Acid-base deficit: 5 mmol/L — ABNORMAL HIGH (ref 0.0–2.0)
Acid-base deficit: 10 mmol/L — ABNORMAL HIGH (ref 0.0–2.0)
Acid-base deficit: 7 mmol/L — ABNORMAL HIGH (ref 0.0–2.0)
Acid-base deficit: 5 mmol/L — ABNORMAL HIGH (ref 0.0–2.0)
Bicarbonate: 18.4 mmol/L — ABNORMAL LOW (ref 20.0–28.0)
Bicarbonate: 21 mmol/L (ref 20.0–28.0)
Bicarbonate: 17.6 mmol/L — ABNORMAL LOW (ref 20.0–28.0)
Bicarbonate: 18.6 mmol/L — ABNORMAL LOW (ref 20.0–28.0)
Bicarbonate: 19.9 mmol/L — ABNORMAL LOW (ref 20.0–28.0)
Calcium, Ion: 1.21 mmol/L (ref 1.15–1.40)
Calcium, Ion: 1.19 mmol/L (ref 1.15–1.40)
Calcium, Ion: 1.21 mmol/L (ref 1.15–1.40)
Calcium, Ion: 1.17 mmol/L (ref 1.15–1.40)
Calcium, Ion: 1.15 mmol/L (ref 1.15–1.40)
HCT: 28 % — ABNORMAL LOW (ref 39.0–52.0)
HCT: 30 % — ABNORMAL LOW (ref 39.0–52.0)
HCT: 31 % — ABNORMAL LOW (ref 39.0–52.0)
HCT: 29 % — ABNORMAL LOW (ref 39.0–52.0)
HCT: 28 % — ABNORMAL LOW (ref 39.0–52.0)
Hemoglobin: 9.5 g/dL — ABNORMAL LOW (ref 13.0–17.0)
Hemoglobin: 10.2 g/dL — ABNORMAL LOW (ref 13.0–17.0)
Hemoglobin: 10.5 g/dL — ABNORMAL LOW (ref 13.0–17.0)
Hemoglobin: 9.9 g/dL — ABNORMAL LOW (ref 13.0–17.0)
Hemoglobin: 9.5 g/dL — ABNORMAL LOW (ref 13.0–17.0)
O2 Saturation: 90 %
O2 Saturation: 94 %
O2 Saturation: 83 %
O2 Saturation: 88 %
O2 Saturation: 90 %
Patient temperature: 36.6
Patient temperature: 36.5
Patient temperature: 36
Patient temperature: 98.2
Patient temperature: 37.6
Potassium: 4.3 mmol/L (ref 3.5–5.1)
Potassium: 4.7 mmol/L (ref 3.5–5.1)
Potassium: 4.8 mmol/L (ref 3.5–5.1)
Potassium: 4 mmol/L (ref 3.5–5.1)
Potassium: 4 mmol/L (ref 3.5–5.1)
Sodium: 134 mmol/L — ABNORMAL LOW (ref 135–145)
Sodium: 134 mmol/L — ABNORMAL LOW (ref 135–145)
Sodium: 133 mmol/L — ABNORMAL LOW (ref 135–145)
Sodium: 134 mmol/L — ABNORMAL LOW (ref 135–145)
Sodium: 134 mmol/L — ABNORMAL LOW (ref 135–145)
TCO2: 20 mmol/L — ABNORMAL LOW (ref 22–32)
TCO2: 22 mmol/L (ref 22–32)
TCO2: 19 mmol/L — ABNORMAL LOW (ref 22–32)
TCO2: 20 mmol/L — ABNORMAL LOW (ref 22–32)
TCO2: 21 mmol/L — ABNORMAL LOW (ref 22–32)
pCO2 arterial: 40.4 mmHg (ref 32–48)
pCO2 arterial: 38.8 mmHg (ref 32–48)
pCO2 arterial: 41 mmHg (ref 32–48)
pCO2 arterial: 37.1 mmHg (ref 32–48)
pCO2 arterial: 37.9 mmHg (ref 32–48)
pH, Arterial: 7.265 — ABNORMAL LOW (ref 7.35–7.45)
pH, Arterial: 7.339 — ABNORMAL LOW (ref 7.35–7.45)
pH, Arterial: 7.235 — ABNORMAL LOW (ref 7.35–7.45)
pH, Arterial: 7.308 — ABNORMAL LOW (ref 7.35–7.45)
pH, Arterial: 7.331 — ABNORMAL LOW (ref 7.35–7.45)
pO2, Arterial: 66 mmHg — ABNORMAL LOW (ref 83–108)
pO2, Arterial: 75 mmHg — ABNORMAL LOW (ref 83–108)
pO2, Arterial: 53 mmHg — ABNORMAL LOW (ref 83–108)
pO2, Arterial: 60 mmHg — ABNORMAL LOW (ref 83–108)
pO2, Arterial: 64 mmHg — ABNORMAL LOW (ref 83–108)

## 2022-07-31 LAB — GLUCOSE, CAPILLARY
Glucose-Capillary: 175 mg/dL — ABNORMAL HIGH (ref 70–99)
Glucose-Capillary: 151 mg/dL — ABNORMAL HIGH (ref 70–99)
Glucose-Capillary: 148 mg/dL — ABNORMAL HIGH (ref 70–99)
Glucose-Capillary: 137 mg/dL — ABNORMAL HIGH (ref 70–99)
Glucose-Capillary: 147 mg/dL — ABNORMAL HIGH (ref 70–99)
Glucose-Capillary: 119 mg/dL — ABNORMAL HIGH (ref 70–99)
Glucose-Capillary: 126 mg/dL — ABNORMAL HIGH (ref 70–99)
Glucose-Capillary: 148 mg/dL — ABNORMAL HIGH (ref 70–99)
Glucose-Capillary: 158 mg/dL — ABNORMAL HIGH (ref 70–99)
Glucose-Capillary: 132 mg/dL — ABNORMAL HIGH (ref 70–99)
Glucose-Capillary: 132 mg/dL — ABNORMAL HIGH (ref 70–99)

## 2022-07-31 LAB — TYPE AND SCREEN
ABO/RH(D): A POS
Antibody Screen: NEGATIVE
Unit division: 0
Unit division: 0

## 2022-07-31 LAB — BASIC METABOLIC PANEL
Anion gap: 9 (ref 5–15)
Anion gap: 11 (ref 5–15)
BUN: 18 mg/dL (ref 8–23)
BUN: 23 mg/dL (ref 8–23)
CO2: 18 mmol/L — ABNORMAL LOW (ref 22–32)
CO2: 20 mmol/L — ABNORMAL LOW (ref 22–32)
Calcium: 8.4 mg/dL — ABNORMAL LOW (ref 8.9–10.3)
Calcium: 8.1 mg/dL — ABNORMAL LOW (ref 8.9–10.3)
Chloride: 103 mmol/L (ref 98–111)
Chloride: 103 mmol/L (ref 98–111)
Creatinine, Ser: 1.49 mg/dL — ABNORMAL HIGH (ref 0.61–1.24)
Creatinine, Ser: 2.07 mg/dL — ABNORMAL HIGH (ref 0.61–1.24)
GFR, Estimated: 51 mL/min — ABNORMAL LOW (ref >60)
GFR, Estimated: 35 mL/min — ABNORMAL LOW (ref >60)
Glucose, Bld: 131 mg/dL — ABNORMAL HIGH (ref 70–99)
Glucose, Bld: 139 mg/dL — ABNORMAL HIGH (ref 70–99)
Potassium: 4.3 mmol/L (ref 3.5–5.1)
Potassium: 3.9 mmol/L (ref 3.5–5.1)
Sodium: 130 mmol/L — ABNORMAL LOW (ref 135–145)
Sodium: 134 mmol/L — ABNORMAL LOW (ref 135–145)

## 2022-07-31 LAB — HEPATIC FUNCTION PANEL
ALT: 19 U/L (ref 0–44)
AST: 45 U/L — ABNORMAL HIGH (ref 15–41)
Albumin: 3.6 g/dL (ref 3.5–5.0)
Alkaline Phosphatase: 24 U/L — ABNORMAL LOW (ref 38–126)
Bilirubin, Direct: 0.4 mg/dL — ABNORMAL HIGH (ref 0.0–0.2)
Indirect Bilirubin: 0.5 mg/dL (ref 0.3–0.9)
Total Bilirubin: 0.9 mg/dL (ref 0.3–1.2)
Total Protein: 5.8 g/dL — ABNORMAL LOW (ref 6.5–8.1)

## 2022-07-31 LAB — CBC
HCT: 31.8 % — ABNORMAL LOW (ref 39.0–52.0)
HCT: 29.8 % — ABNORMAL LOW (ref 39.0–52.0)
Hemoglobin: 10.8 g/dL — ABNORMAL LOW (ref 13.0–17.0)
Hemoglobin: 10.1 g/dL — ABNORMAL LOW (ref 13.0–17.0)
MCH: 31.4 pg (ref 26.0–34.0)
MCH: 31.3 pg (ref 26.0–34.0)
MCHC: 34 g/dL (ref 30.0–36.0)
MCHC: 33.9 g/dL (ref 30.0–36.0)
MCV: 92.4 fL (ref 80.0–100.0)
MCV: 92.3 fL (ref 80.0–100.0)
Platelets: 211 10*3/uL (ref 150–400)
Platelets: 209 10*3/uL (ref 150–400)
RBC: 3.44 MIL/uL — ABNORMAL LOW (ref 4.22–5.81)
RBC: 3.23 MIL/uL — ABNORMAL LOW (ref 4.22–5.81)
RDW: 12.4 % (ref 11.5–15.5)
RDW: 12.8 % (ref 11.5–15.5)
WBC: 16 10*3/uL — ABNORMAL HIGH (ref 4.0–10.5)
WBC: 17.9 10*3/uL — ABNORMAL HIGH (ref 4.0–10.5)
nRBC: 0 % (ref 0.0–0.2)
nRBC: 0 % (ref 0.0–0.2)

## 2022-07-31 LAB — BPAM RBC
Blood Product Expiration Date: 202311272359
Blood Product Expiration Date: 202311272359
ISSUE DATE / TIME: 202311090853
ISSUE DATE / TIME: 202311090853
Unit Type and Rh: 6200
Unit Type and Rh: 6200

## 2022-07-31 LAB — MAGNESIUM
Magnesium: 2.6 mg/dL — ABNORMAL HIGH (ref 1.7–2.4)
Magnesium: 2.1 mg/dL (ref 1.7–2.4)

## 2022-07-31 MED ORDER — PHENYLEPHRINE HCL-NACL 20-0.9 MG/250ML-% IV SOLN
0.0000 ug/min | INTRAVENOUS | Status: DC
  Administered 2022-07-31: 50 ug/min via INTRAVENOUS
  Filled 2022-07-31: qty 250

## 2022-07-31 MED ORDER — SODIUM BICARBONATE 8.4 % IV SOLN
INTRAVENOUS | Status: AC
  Filled 2022-07-31: qty 50

## 2022-07-31 MED ORDER — ORAL CARE MOUTH RINSE: 15.0000 mL | OROMUCOSAL | Status: DC | PRN

## 2022-07-31 MED ORDER — IPRATROPIUM-ALBUTEROL 0.5-2.5 (3) MG/3ML IN SOLN
3.0000 mL | Freq: Four times a day (QID) | RESPIRATORY_TRACT | Status: DC
  Administered 2022-07-31 – 2022-08-05 (×21): 3 mL via RESPIRATORY_TRACT
  Filled 2022-07-31 (×20): qty 3

## 2022-07-31 MED ORDER — CYCLOBENZAPRINE HCL 5 MG PO TABS
7.5000 mg | ORAL_TABLET | Freq: Every day | ORAL | Status: DC
  Administered 2022-08-01 – 2022-08-05 (×5): 7.5 mg via ORAL
  Filled 2022-07-31 (×7): qty 1.5

## 2022-07-31 MED ORDER — MORPHINE SULFATE (PF) 2 MG/ML IV SOLN
1.0000 mg | INTRAVENOUS | Status: DC | PRN
  Administered 2022-07-31 (×2): 4 mg via INTRAVENOUS
  Filled 2022-07-31 (×2): qty 2

## 2022-07-31 MED ORDER — INSULIN ASPART 100 UNIT/ML IJ SOLN
0.0000 [IU] | INTRAMUSCULAR | Status: DC
  Administered 2022-07-31 – 2022-08-01 (×6): 2 [IU] via SUBCUTANEOUS
  Administered 2022-08-01: 4 [IU] via SUBCUTANEOUS
  Administered 2022-08-01 – 2022-08-03 (×10): 2 [IU] via SUBCUTANEOUS

## 2022-07-31 MED ORDER — FUROSEMIDE 10 MG/ML IJ SOLN
80.0000 mg | Freq: Two times a day (BID) | INTRAMUSCULAR | Status: DC
  Administered 2022-08-01 – 2022-08-02 (×4): 80 mg via INTRAVENOUS
  Filled 2022-07-31 (×4): qty 8

## 2022-07-31 MED ORDER — THIAMINE HCL 100 MG/ML IJ SOLN
100.0000 mg | Freq: Every day | INTRAMUSCULAR | Status: DC
  Administered 2022-07-31 – 2022-08-05 (×6): 100 mg via INTRAVENOUS
  Filled 2022-07-31 (×6): qty 2

## 2022-07-31 MED ORDER — LIDOCAINE 5 % EX PTCH
2.0000 | MEDICATED_PATCH | CUTANEOUS | Status: DC
  Administered 2022-07-31 – 2022-08-06 (×7): 2 via TRANSDERMAL
  Filled 2022-07-31 (×7): qty 2

## 2022-07-31 MED ORDER — SODIUM CHLORIDE 0.9 % IV SOLN
2.0000 g | Freq: Two times a day (BID) | INTRAVENOUS | Status: AC
  Administered 2022-07-31 – 2022-08-05 (×11): 2 g via INTRAVENOUS
  Filled 2022-07-31 (×11): qty 12.5

## 2022-07-31 MED ORDER — SODIUM BICARBONATE 8.4 % IV SOLN
50.0000 meq | Freq: Once | INTRAVENOUS | Status: AC
  Administered 2022-07-31: 50 meq via INTRAVENOUS

## 2022-07-31 MED ORDER — SODIUM BICARBONATE 8.4 % IV SOLN
50.0000 meq | Freq: Once | INTRAVENOUS | Status: AC
  Administered 2022-07-31: 50 meq via INTRAVENOUS
  Filled 2022-07-31: qty 50

## 2022-07-31 MED ORDER — INSULIN ASPART 100 UNIT/ML IJ SOLN: 0.0000 [IU] | INTRAMUSCULAR | Status: DC

## 2022-07-31 MED ORDER — METHYLPREDNISOLONE SODIUM SUCC 40 MG IJ SOLR
INTRAMUSCULAR | Status: AC
  Filled 2022-07-31: qty 2

## 2022-07-31 MED ORDER — METHYLPREDNISOLONE SODIUM SUCC 125 MG IJ SOLR
80.0000 mg | Freq: Two times a day (BID) | INTRAMUSCULAR | Status: AC
  Administered 2022-07-31 – 2022-08-03 (×6): 80 mg via INTRAVENOUS
  Filled 2022-07-31 (×5): qty 2

## 2022-07-31 MED ORDER — FENTANYL CITRATE PF 50 MCG/ML IJ SOSY
50.0000 ug | PREFILLED_SYRINGE | INTRAMUSCULAR | Status: DC | PRN
  Administered 2022-07-31 – 2022-08-02 (×11): 50 ug via INTRAVENOUS
  Filled 2022-07-31 (×11): qty 1

## 2022-07-31 MED ORDER — MIDAZOLAM HCL 2 MG/2ML IJ SOLN: 2.0000 mg | INTRAMUSCULAR | Status: DC | PRN

## 2022-07-31 MED ORDER — FUROSEMIDE 10 MG/ML IJ SOLN
40.0000 mg | Freq: Two times a day (BID) | INTRAMUSCULAR | Status: DC
  Administered 2022-07-31: 40 mg via INTRAVENOUS
  Filled 2022-07-31: qty 4

## 2022-07-31 MED ORDER — FUROSEMIDE 10 MG/ML IJ SOLN
INTRAMUSCULAR | Status: AC
  Administered 2022-07-31: 80 mg via INTRAVENOUS
  Filled 2022-07-31: qty 8

## 2022-07-31 MED ORDER — NOREPINEPHRINE 4 MG/250ML-% IV SOLN
0.0000 ug/min | INTRAVENOUS | Status: DC
  Administered 2022-07-31: 2 ug/min via INTRAVENOUS
  Filled 2022-07-31 (×2): qty 250

## 2022-07-31 NOTE — Progress Notes (Signed)
Patient ID: Carl [name], male   DOB: 05/14/1956, 66 y.o.   MRN: [id number] [facility] Evening Rounds:  Hemodynamically stable on milrinone o.125 and NE 4, neo 30  Sats 96% on Bipap 80%. ABG 7.33, PCO2 38, PO2 64, -5, 90%. Will give him another amp of bicarb. Metabolic acidosis likely due to kidney disease.  UO ok  creat up to 2.07 tonight. Responded a little to lasix.

## 2022-07-31 NOTE — Progress Notes (Signed)
CT Surgery S/P CABG after NSTEMI, active smoker with sig COPD  Pt developed respiratory distress while moving from chair to bed on 10L  hi  flow O2. Sats dropped low 80s and pt placed on Bipap with stabilization. Repeat ABG pending but sats now > 95%  Stat CXR- no sig edema, no pntx. Basilar atlx Breath sounds coarse with wheezing - iv solumedrol given, antibiotic coverage broadened to Cefipime  Will keep npo and cont BIPAP tonight whic patient tolerating fairly well  Blood pressure 99/88, pulse 91, temperature (!) 97.5 F (36.4 C), resp. rate (!) 22, height '5\' 9"'$  (1.753 m), weight 91.9 kg, SpO2 95 %.

## 2022-07-31 NOTE — TOC CM/SW Note (Signed)
TOC CM spoke to pt and son at bedside. Pt is requesting a RW with seat and bedside commode for home. Possible PT needed. Will need PT/OT recommendations. Lives with wife and son. Will continue to follow for dc needs. [place], Heart Failure TOC CM [phone]

## 2022-07-31 NOTE — Progress Notes (Addendum)
[facility] DAILY ICU PROGRESS NOTE                   [address]            [place], [address]          [phone]   1 Day Post-Op Procedure(s) (LRB): CORONARY ARTERY BYPASS GRAFTING (CABG) ([name]) TRANSESOPHAGEAL ECHOCARDIOGRAM (TEE) ([name])  Total Length of Stay:  LOS: 2 days   Subjective: Feels ok, mod pain  Objective: Vital signs in last 24 hours: Temp:  [95.9 F (35.5 C)-98.4 F (36.9 C)] 97.7 F (36.5 C) (11/10 0700) Pulse Rate:  [87-91] 87 (11/10 0700) Cardiac Rhythm: Atrial paced (11/10 0400) Resp:  [12-43] 31 (11/10 0700) BP: (91-130)/(63-77) 107/69 (11/10 0700) SpO2:  [91 %-100 %] 91 % (11/10 0700) Arterial Line BP: (89-141)/(51-84) 112/61 (11/10 0700) FiO2 (%):  [40 %-80 %] 40 % (11/09 1720) Weight:  [91.9 kg] 91.9 kg (11/10 0500)  Filed Weights   07/28/22 1253 07/30/22 0517 07/31/22 0500  Weight: 88.9 kg 86.4 kg 91.9 kg    Weight change: 5.5 kg   Hemodynamic parameters for last 24 hours: PAP: (21-55)/(11-25) 40/22 CO:  [4.9 L/min-8.8 L/min] 5.9 L/min CI:  [2.4 L/min/m2-4.3 L/min/m2] 2.9 L/min/m2  Intake/Output from previous day: 11/09 0701 - 11/10 0700 In: 4783.2 [P.O.:290; I.V.:2544.4; Blood:440; IV Piggyback:1508.8] Out: 9924 [QASTM:1962; Emesis/NG output:100; Blood:850; Chest Tube:720]  Intake/Output this shift: No intake/output data recorded.  Current Meds: Scheduled Meds:  acetaminophen  1,000 mg Oral Q6H   Or   acetaminophen (TYLENOL) oral liquid 160 mg/5 mL  1,000 mg Per Tube Q6H   aspirin EC  325 mg Oral Daily   Or   aspirin  324 mg Per Tube Daily   bisacodyl  10 mg Oral Daily   Or   bisacodyl  10 mg Rectal Daily   Chlorhexidine Gluconate Cloth  6 each Topical Daily   docusate sodium  200 mg Oral Daily   fenofibrate  160 mg Oral Daily   guaiFENesin  600 mg Oral BID   levalbuterol  1.25 mg Nebulization Q6H   levothyroxine  125 mcg Oral Q0600   lidocaine  2 patch Transdermal Q24H   metoCLOPramide (REGLAN) injection  10  mg Intravenous Q6H   metoprolol tartrate  12.5 mg Oral BID   Or   metoprolol tartrate  12.5 mg Per Tube BID   [START ON 08/01/2022] pantoprazole  40 mg Oral Daily   rosuvastatin  40 mg Oral q morning   sodium chloride flush  3 mL Intravenous Q12H   Continuous Infusions:  sodium chloride 20 mL/hr at 07/30/22 1410   sodium chloride     sodium chloride Stopped (07/30/22 2128)   albumin human 60 mL/hr at 07/31/22 0000    ceFAZolin (ANCEF) IV Stopped (07/31/22 0542)   dexmedetomidine (PRECEDEX) IV infusion Stopped (07/31/22 2297)   insulin 0.8 Units/hr (07/31/22 0700)   lactated ringers     lactated ringers Stopped (07/30/22 1607)   lactated ringers 20 mL/hr at 07/31/22 0700   milrinone 0.25 mcg/kg/min (07/31/22 0700)   nitroGLYCERIN Stopped (07/30/22 1608)   phenylephrine (NEO-SYNEPHRINE) Adult infusion 45 mcg/min (07/31/22 0700)   vancomycin Stopped (07/30/22 2113)   PRN Meds:.sodium chloride, albumin human, dextrose, lactated ringers, metoprolol tartrate, midazolam, morphine injection, ondansetron (ZOFRAN) IV, mouth rinse, oxyCODONE, sodium chloride flush, traMADol  General appearance: alert, cooperative, and no distress Heart: regular rate and rhythm Lungs: ronchi in upper airways- clears well with cough Abdomen: benign  Extremities: warm Wound: dressings CDI  Lab Results: CBC: Recent Labs    07/30/22 2018 07/31/22 0401 07/31/22 0437  WBC 9.8 16.0*  --   HGB 10.5* 10.8* 9.5*  HCT 30.5* 31.8* 28.0*  PLT 166 211  --    BMET:  Recent Labs    07/30/22 2018 07/31/22 0401 07/31/22 0437  NA 137 130* 134*  K 4.3 4.3 4.3  CL 107 103  --   CO2 18* 18*  --   GLUCOSE 138* 131*  --   BUN 15 18  --   CREATININE 1.16 1.49*  --   CALCIUM 9.0 8.4*  --     CMET: Lab Results  Component Value Date   WBC 16.0 (H) 07/31/2022   HGB 9.5 (L) 07/31/2022   HCT 28.0 (L) 07/31/2022   PLT 211 07/31/2022   GLUCOSE 131 (H) 07/31/2022   CHOL 122 07/29/2022   TRIG 131 07/29/2022    HDL 31 (L) 07/29/2022   LDLCALC 65 07/29/2022   ALT 20 07/29/2022   AST 28 07/29/2022   NA 134 (L) 07/31/2022   K 4.3 07/31/2022   CL 103 07/31/2022   CREATININE 1.49 (H) 07/31/2022   BUN 18 07/31/2022   CO2 18 (L) 07/31/2022   TSH 0.392 07/29/2022   INR 1.2 07/30/2022   HGBA1C 5.3 07/28/2022      PT/INR:  Recent Labs    07/30/22 1421  LABPROT 15.2  INR 1.2   Radiology: [facility] Chest Port 1 View  Result Date: 07/30/2022 CLINICAL DATA:  Status post coronary bypass graft. EXAM: PORTABLE CHEST 1 VIEW COMPARISON:  Same day. FINDINGS: Endotracheal and nasogastric tubes are in grossly good position. Right internal jugular Swan-Ganz catheter is noted with tip in right pulmonary artery. Stable left-sided chest tube is noted without pneumothorax. Lungs are clear. Bony thorax is unremarkable. IMPRESSION: Stable support apparatus. Stable left-sided chest tube without pneumothorax. Electronically Signed   By: Marijo Conception M.D.   On: 07/30/2022 14:42   DG Chest 1 View  Result Date: 07/30/2022 CLINICAL DATA:  Surgery EXAM: CHEST  1 VIEW COMPARISON:  Chest x-ray dated July 28, 2022 FINDINGS: Cardiac and mediastinal contours are within normal limits status post median sternotomy. ETT tip is approximately 4 cm from the carina. Enteric tube partially seen coursing below the diaphragm with tip overlying the expected area of the stomach. Right IJ approach PA catheter with tip overlying the expected area of the interlobar artery. Left-sided chest tube. Mild diffuse heterogeneous opacities, likely due to pulmonary edema. No large pleural effusion or pneumothorax. IMPRESSION: 1. Support device positioning as above. 2. Mild bilateral heterogeneous opacities, likely due to pulmonary edema Electronically Signed   By: Yetta Glassman M.D.   On: 07/30/2022 14:11     Assessment/Plan: S/P Procedure(s) (LRB): CORONARY ARTERY BYPASS GRAFTING (CABG) ([name]) TRANSESOPHAGEAL ECHOCARDIOGRAM (TEE) ([name]) POD #1  1  afeb, stable hemodynamics with some increased PAP, s BP 90's- 130's, a paced, on milrinone and neo- metabolic acidosis on ABG- bicarb ordered 2 extubated to 10 l HFNC sats>90, significant tobacco abuse  hx, will change xopenex to  duonebs, add flutter valve 3 weight at about 2 kg >preop, good UOP 4 CT 720 cc- keep in place 5 BS control is ok 6 increased creat from baseline, 1,49, AKI- pro hemodynamically mediated 7 increased leukocytosis , prob reactive 8 expected ABLA- monitor clinically 9 CXR perihilar vasc fullness- will need some diuresis  10 rehab modalities as able  John Giovanni  07/31/2022 7:45 AM  Agree with above impression and recommendations. Acute MI with preoperative pulmonary and renal disease. Patient examined, a.m. chest x-ray reviewed Patientwith stable hemodynamics, minimal inotropic support.  We will start diuresis to help oxygenation-patient with significant underlying COPD  postoperative interstitial congestion. Mild postoperative metabolic acidosis probably related to renal status. Plan removal of PD catheter, mobilization to chair and slow wean of milrinone over 48 hours since he had preoperative MRI.

## 2022-07-31 NOTE — Progress Notes (Signed)
Rounding Note    Patient Name: Carl [name] Date of Encounter: 07/31/2022  [place] Cardiologist: Larae Grooms, MD   Subjective   Patient complains of generalized soreness, otherwise no specific complaints  Inpatient Medications    Scheduled Meds:  acetaminophen  1,000 mg Oral Q6H   Or   acetaminophen (TYLENOL) oral liquid 160 mg/5 mL  1,000 mg Per Tube Q6H   aspirin EC  325 mg Oral Daily   Or   aspirin  324 mg Per Tube Daily   bisacodyl  10 mg Oral Daily   Or   bisacodyl  10 mg Rectal Daily   Chlorhexidine Gluconate Cloth  6 each Topical Daily   cyclobenzaprine  7.5 mg Oral QHS   docusate sodium  200 mg Oral Daily   fenofibrate  160 mg Oral Daily   furosemide  40 mg Intravenous BID   guaiFENesin  600 mg Oral BID   insulin aspart  0-24 Units Subcutaneous Q4H   ipratropium-albuterol  3 mL Nebulization Q6H   levothyroxine  125 mcg Oral Q0600   lidocaine  2 patch Transdermal Q24H   metoCLOPramide (REGLAN) injection  10 mg Intravenous Q6H   metoprolol tartrate  12.5 mg Oral BID   Or   metoprolol tartrate  12.5 mg Per Tube BID   [START ON 08/01/2022] pantoprazole  40 mg Oral Daily   rosuvastatin  40 mg Oral q morning   sodium chloride flush  3 mL Intravenous Q12H   thiamine  100 mg Intravenous Daily   Continuous Infusions:  sodium chloride 20 mL/hr at 07/30/22 1410   sodium chloride     sodium chloride Stopped (07/30/22 2128)   albumin human 60 mL/hr at 07/31/22 0000    ceFAZolin (ANCEF) IV Stopped (07/31/22 0542)   dexmedetomidine (PRECEDEX) IV infusion Stopped (07/31/22 1505)   insulin 0.8 Units/hr (07/31/22 0700)   lactated ringers Stopped (07/30/22 1607)   lactated ringers 20 mL/hr at 07/31/22 0700   milrinone 0.125 mcg/kg/min (07/31/22 0811)   nitroGLYCERIN Stopped (07/30/22 1608)   phenylephrine (NEO-SYNEPHRINE) Adult infusion 45 mcg/min (07/31/22 0700)   PRN Meds: sodium chloride, albumin human, dextrose, metoprolol tartrate,  midazolam, morphine injection, ondansetron (ZOFRAN) IV, mouth rinse, oxyCODONE, sodium chloride flush, traMADol   Vital Signs    Vitals:   07/31/22 0500 07/31/22 0600 07/31/22 0630 07/31/22 0700  BP: 112/70  130/74 107/69  Pulse: 91 89 90 87  Resp: (!) 38 17 (!) 38 (!) 31  Temp:  97.7 F (36.5 C) (!) 97.5 F (36.4 C) 97.7 F (36.5 C)  TempSrc:      SpO2:  93% 92% 91%  Weight: 91.9 kg     Height:        Intake/Output Summary (Last 24 hours) at 07/31/2022 0912 Last data filed at 07/31/2022 0700 Gross per 24 hour  Intake 4483.18 ml  Output 3390 ml  Net 1093.18 ml      07/31/2022    5:00 AM 07/30/2022    5:17 AM 07/28/2022   12:53 PM  Last 3 Weights  Weight (lbs) 202 lb 9.6 oz 190 lb 7.6 oz 196 lb  Weight (kg) 91.9 kg 86.4 kg 88.905 kg      Telemetry    Atrial paced without significant arrhythmia, occasional PVCs- Personally Reviewed  ECG    Sinus bradycardia 57 bpm, T wave abnormality consider lateral ischemia - Personally Reviewed  Physical Exam  Alert, oriented, no distress GEN: No acute distress.   Neck: No  JVD Cardiac: RRR, no murmurs, rubs, or gallops.  Respiratory: Diminished breath sounds bilaterally in the bases GI: Soft, nontender, non-distended  MS: Mild diffuse edema; No deformity. Neuro:  Nonfocal  Psych: Normal affect   Labs    High Sensitivity Troponin:   Recent Labs  Lab 07/28/22 1303 07/28/22 1800  TROPONINIHS 3,095* 3,052*     Chemistry Recent Labs  Lab 07/29/22 1553 07/30/22 0253 07/30/22 0801 07/30/22 1241 07/30/22 1244 07/30/22 2018 07/31/22 0401 07/31/22 0437 07/31/22 0825  NA 139 137   < > 133*   < > 137 130* 134* 134*  K 4.0 3.6   < > 5.4*   < > 4.3 4.3 4.3 4.7  CL 109 107   < > 101  --  107 103  --   --   CO2 22 23  --   --   --  18* 18*  --   --   GLUCOSE 100* 98   < > 138*  --  138* 131*  --   --   BUN 16 17   < > 16  --  15 18  --   --   CREATININE 1.24 1.27*   < > 1.00  --  1.16 1.49*  --   --   CALCIUM 9.2 9.3   --   --   --  9.0 8.4*  --   --   MG  --   --   --   --   --  2.6* 2.6*  --   --   PROT 5.9*  --   --   --   --   --   --   --   --   ALBUMIN 3.5  --   --   --   --   --   --   --   --   AST 28  --   --   --   --   --   --   --   --   ALT 20  --   --   --   --   --   --   --   --   ALKPHOS 31*  --   --   --   --   --   --   --   --   BILITOT 0.6  --   --   --   --   --   --   --   --   GFRNONAA >60 >60  --   --   --  >60 51*  --   --   ANIONGAP 8 7  --   --   --  12 9  --   --    < > = values in this interval not displayed.    Lipids  Recent Labs  Lab 07/29/22 0311  CHOL 122  TRIG 131  HDL 31*  LDLCALC 65  CHOLHDL 3.9    Hematology Recent Labs  Lab 07/30/22 1421 07/30/22 1422 07/30/22 2018 07/31/22 0401 07/31/22 0437 07/31/22 0825  WBC 9.3  --  9.8 16.0*  --   --   RBC 3.56*  --  3.38* 3.44*  --   --   HGB 11.2*   < > 10.5* 10.8* 9.5* 10.2*  HCT 33.4*   < > 30.5* 31.8* 28.0* 30.0*  MCV 93.8  --  90.2 92.4  --   --   MCH 31.5  --  31.1 31.4  --   --  MCHC 33.5  --  34.4 34.0  --   --   RDW 12.2  --  12.1 12.4  --   --   PLT 132*  --  166 211  --   --    < > = values in this interval not displayed.   Thyroid  Recent Labs  Lab 07/29/22 1553  TSH 0.392    BNPNo results for input(s): "BNP", "PROBNP" in the last 168 hours.  DDimer No results for input(s): "DDIMER" in the last 168 hours.   Radiology    DG Chest Port 1 View  Result Date: 07/31/2022 CLINICAL DATA:  Status post coronary bypass graft. EXAM: PORTABLE CHEST 1 VIEW COMPARISON:  July 30, 2022. FINDINGS: Stable cardiomegaly. Endotracheal and nasogastric tubes have been removed. Left-sided chest tube is noted without pneumothorax. Bilateral perihilar and basilar densities are noted concerning for pulmonary edema or atelectasis. Small pleural effusions are noted. Right internal jugular Swan-Ganz catheter is noted with tip directed toward right pulmonary artery. Bony thorax is unremarkable. IMPRESSION:  Endotracheal and nasogastric tubes have been removed. Left-sided chest tube is noted without pneumothorax. Increased bibasilar edema or atelectasis is noted with small pleural effusions. Electronically Signed   By: Marijo Conception M.D.   On: 07/31/2022 08:31   DG Chest Port 1 View  Result Date: 07/30/2022 CLINICAL DATA:  Status post coronary bypass graft. EXAM: PORTABLE CHEST 1 VIEW COMPARISON:  Same day. FINDINGS: Endotracheal and nasogastric tubes are in grossly good position. Right internal jugular Swan-Ganz catheter is noted with tip in right pulmonary artery. Stable left-sided chest tube is noted without pneumothorax. Lungs are clear. Bony thorax is unremarkable. IMPRESSION: Stable support apparatus. Stable left-sided chest tube without pneumothorax. Electronically Signed   By: Marijo Conception M.D.   On: 07/30/2022 14:42   DG Chest 1 View  Result Date: 07/30/2022 CLINICAL DATA:  Surgery EXAM: CHEST  1 VIEW COMPARISON:  Chest x-ray dated July 28, 2022 FINDINGS: Cardiac and mediastinal contours are within normal limits status post median sternotomy. ETT tip is approximately 4 cm from the carina. Enteric tube partially seen coursing below the diaphragm with tip overlying the expected area of the stomach. Right IJ approach PA catheter with tip overlying the expected area of the interlobar artery. Left-sided chest tube. Mild diffuse heterogeneous opacities, likely due to pulmonary edema. No large pleural effusion or pneumothorax. IMPRESSION: 1. Support device positioning as above. 2. Mild bilateral heterogeneous opacities, likely due to pulmonary edema Electronically Signed   By: Yetta Glassman M.D.   On: 07/30/2022 14:11   ECHO INTRAOPERATIVE TEE  Result Date: 07/30/2022  *INTRAOPERATIVE TRANSESOPHAGEAL REPORT *  Patient Name:   Carl [name]  Date of Exam: 07/30/2022 Medical Rec #:  [id number]      Height:       69.0 in Accession #:    [id number]     Weight:       190.5 lb Date of Birth:  Aug 29, 1956       BSA:          2.02 m Patient Age:    22 years       BP:           120/78 mmHg Patient Gender: M              HR:           64 bpm. Exam Location:  Anesthesiology Transesophogeal exam was perform intraoperatively during surgical procedure. Patient was closely monitored under  general anesthesia during the entirety of examination. Indications:     CAD Native Vessel i25.10 Sonographer:     Raquel Sarna Senior RDCS Performing Phys: [place] VANTRIGT Diagnosing Phys: Duane Boston MD Complications: No known complications during this procedure. POST-OP IMPRESSIONS Overall, there were no significant changes from pre-bypass. _ Comments: Good LVFx post bypass, no changes in other findings. PRE-OP FINDINGS  Left Ventricle: The left ventricle has normal systolic function, with an ejection fraction of 55-60%. The cavity size was normal. No evidence of left ventricular regional wall motion abnormalities. There is moderate left ventricular hypertrophy. Right Ventricle: The right ventricle has normal systolic function. The cavity was normal. There is no increase in right ventricular wall thickness. Left Atrium: Left atrial size was normal in size. No left atrial/left atrial appendage thrombus was detected. Right Atrium: Right atrial size was normal in size. Interatrial Septum: No atrial level shunt detected by color flow Doppler. Pericardium: There is no evidence of pericardial effusion. Mitral Valve: The mitral valve is normal in structure. Mitral valve regurgitation is mild by color flow Doppler. There is No evidence of mitral stenosis. Tricuspid Valve: The tricuspid valve was normal in structure. Tricuspid valve regurgitation is trivial by color flow Doppler. No evidence of tricuspid stenosis is present. Aortic Valve: The aortic valve is normal in structure. Aortic valve regurgitation was not visualized by color flow Doppler. There is no stenosis of the aortic valve. Pulmonic Valve: The pulmonic valve was normal in structure.  Pulmonic valve regurgitation is trivial by color flow Doppler. Aorta: The ascending aorta is normal in size and structure. The ascending aorta was not well visualized. There is evidence of plaque in the descending aorta; Grade III, measuring 3-86m in size.  JDuane BostonMD Electronically signed by JDuane BostonMD Signature Date/Time: 07/30/2022/1:52:15 PM    Final    VAS [place]OPPLER PRE CABG  Result Date: 07/29/2022 PREOPERATIVE VASCULAR EVALUATION Patient Name:  Carl [name] Date of Exam:   07/29/2022 Medical Rec #: [id number]     Accession #:    [id number] of Birth: 9Feb 16, 1957     Patient Gender: M Patient Age:   89years [facility] [place] PRE CABG Referring Phys: PCollier SalinaVANTRIGT --------------------------------------------------------------------------------  Risk Factors: Hypertension, hyperlipidemia, current smoker, coronary artery               disease, prior CVA. Performing Technologist: ROda CoganRDMS, RVT  Examination Guidelines: A complete evaluation includes B-mode imaging, spectral Doppler, color Doppler, and power Doppler as needed of all accessible portions of each vessel. Bilateral testing is considered an integral part of a complete examination. Limited examinations for reoccurring indications may be performed as noted.  Right Carotid Findings: +----------+--------+--------+--------+------------+------------------+           PSV cm/sEDV cm/sStenosisDescribe    Comments           +----------+--------+--------+--------+------------+------------------+ CCA Prox  100     29                          intimal thickening +----------+--------+--------+--------+------------+------------------+ CCA Mid                                       intimal thickening +----------+--------+--------+--------+------------+------------------+ CCA Distal93      34  intimal thickening  +----------+--------+--------+--------+------------+------------------+ ICA Prox  60      24      1-39%   heterogenous                   +----------+--------+--------+--------+------------+------------------+ ICA Mid   85      39      1-39%                                  +----------+--------+--------+--------+------------+------------------+ ICA Distal112     43                                             +----------+--------+--------+--------+------------+------------------+ ECA       95      25                                             +----------+--------+--------+--------+------------+------------------+ +----------+--------+-------+----------------+------------+           PSV cm/sEDV cmsDescribe        Arm Pressure +----------+--------+-------+----------------+------------+ Subclavian116            Multiphasic, WNL             +----------+--------+-------+----------------+------------+ +---------+--------+--+--------+--+---------+ VertebralPSV cm/s59EDV cm/s29Antegrade +---------+--------+--+--------+--+---------+ Left Carotid Findings: +----------+--------+--------+--------+--------+------------------+           PSV cm/sEDV cm/sStenosisDescribeComments           +----------+--------+--------+--------+--------+------------------+ CCA Prox  92      21                                         +----------+--------+--------+--------+--------+------------------+ CCA Distal124     39                      intimal thickening +----------+--------+--------+--------+--------+------------------+ ICA Prox  77      31      1-39%           intimal thickening +----------+--------+--------+--------+--------+------------------+ ICA Mid   109     44      1-39%                              +----------+--------+--------+--------+--------+------------------+ ICA Distal109     51                                          +----------+--------+--------+--------+--------+------------------+ ECA       135     35                                         +----------+--------+--------+--------+--------+------------------+ +----------+--------+--------+----------------+------------+ SubclavianPSV cm/sEDV cm/sDescribe        Arm Pressure +----------+--------+--------+----------------+------------+           142             Multiphasic, WNL             +----------+--------+--------+----------------+------------+ +---------+--------+--+--------+--+---------+  VertebralPSV cm/s65EDV cm/s22Antegrade +---------+--------+--+--------+--+---------+  ABI Findings: +--------+------------------+-----+--------+--------+ Right   Rt Pressure (mmHg)IndexWaveformComment  +--------+------------------+-----+--------+--------+ Brachial                       biphasic         +--------+------------------+-----+--------+--------+ +--------+------------------+-----+--------+-------+ Left    Lt Pressure (mmHg)IndexWaveformComment +--------+------------------+-----+--------+-------+ Brachial                       biphasic        +--------+------------------+-----+--------+-------+ ABI done on 07/24/22 showed right sided 0.82, left sided 0.77.  Right Doppler Findings: +--------+--------+-----+--------+--------+ Site    PressureIndexDoppler Comments +--------+--------+-----+--------+--------+ Brachial             biphasic         +--------+--------+-----+--------+--------+ Radial               biphasic         +--------+--------+-----+--------+--------+ Ulnar                biphasic         +--------+--------+-----+--------+--------+  Left Doppler Findings: +--------+--------+-----+--------+--------+ Site    PressureIndexDoppler Comments +--------+--------+-----+--------+--------+ Brachial             biphasic         +--------+--------+-----+--------+--------+ Radial                biphasic         +--------+--------+-----+--------+--------+ Ulnar                biphasic         +--------+--------+-----+--------+--------+   Summary: Right Carotid: Velocities in the right ICA are consistent with a 1-39% stenosis. Left Carotid: Velocities in the left ICA are consistent with a 1-39% stenosis. Vertebrals: Bilateral vertebral arteries demonstrate antegrade flow. Right Upper Extremity: Doppler waveform obliterate with right radial compression. Doppler waveforms remain within normal limits with right ulnar compression. Left Upper Extremity: Doppler waveform obliterate with left radial compression. Doppler waveforms remain within normal limits with left ulnar compression.  Electronically signed by Jamelle Haring on 07/29/2022 at 3:53:41 PM.    Final    CT chest w/o contrast  Result Date: 07/29/2022 CLINICAL DATA:  Preop for cardiac surgery. EXAM: CT CHEST WITHOUT CONTRAST TECHNIQUE: Multidetector CT imaging of the chest was performed following the standard protocol without IV contrast. RADIATION DOSE REDUCTION: This exam was performed according to the departmental dose-optimization program which includes automated exposure control, adjustment of the mA and/or kV according to patient size and/or use of iterative reconstruction technique. COMPARISON:  Remote CT scan from 2008 FINDINGS: Cardiovascular: The ascending thoracic aorta is normal in caliber. Moderate atherosclerotic calcifications around the aortic valve and also at the aortic arch. Extensive three-vessel coronary artery calcifications are noted. Mediastinum/Nodes: No mediastinal or hilar mass or lymphadenopathy. Small scattered lymph nodes are noted. The esophagus is unremarkable. Mild the thyroid gland is unremarkable. Lungs/Pleura: Emphysematous changes and areas of pulmonary scarring. No infiltrates or effusions. No worrisome pulmonary lesions. A few small scattered subpleural calcified granulomas are noted. Small wedge-shaped  peripheral subpleural density in the right middle lobe is likely an area atelectasis. The central tracheobronchial tree is unremarkable. Upper Abdomen: Diffuse and marked fatty infiltration of the liver. Suspect splenomegaly. Advanced vascular calcifications. Musculoskeletal: No chest wall mass, supraclavicular or axillary adenopathy. The bony thorax is intact. IMPRESSION: 1. Moderate atherosclerotic calcifications around the aortic valve and also at the aortic arch. 2. Extensive three-vessel coronary artery calcifications. 3. Emphysematous changes and  areas of pulmonary scarring but no acute pulmonary findings or worrisome pulmonary lesions. 4. Diffuse and marked fatty infiltration of the liver. 5. Suspect splenomegaly. Aortic Atherosclerosis (ICD10-I70.0) and Emphysema (ICD10-J43.9). Electronically Signed   By: Marijo Sanes M.D.   On: 07/29/2022 14:28     Patient Profile     66 y.o. 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  Unstable angina: Patient now status post CABG.  Early postop stable, on low-dose milrinone to augment cardiac output.  Cardiac regimen includes aspirin, metoprolol, and high intensity statin drug with rosuvastatin 40 mg.  Management per TCTS team.  Appreciate their care. 2.  Mixed hyperlipidemia: Treated with rosuvastatin and fenofibrate 3.  Hypertension: Currently requiring milrinone.  Only on low-dose metoprolol in the early postop period. 4.  Postoperative volume overload: Diuresed with IV furosemide and augmented with milrinone.  Heart rhythm stable, atrial paced with underlying sinus bradycardia reviewed on this morning's EKG.  Will follow.      For questions or updates, please contact [place] Please consult [email or web address] for contact info under        Signed, Sherren Mocha, MD  07/31/2022, 9:12 AM

## 2022-07-31 NOTE — Discharge Summary (Signed)
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 ascending thoracic aorta is normal in caliber. Moderate atherosclerotic calcifications around the aortic valve and also at the aortic arch. Extensive three-vessel coronary artery calcifications are noted. Mediastinum/Nodes: No mediastinal or hilar mass or lymphadenopathy. Small scattered lymph nodes are noted. The esophagus is  unremarkable. Mild the thyroid gland is unremarkable. Lungs/Pleura: Emphysematous changes and areas of pulmonary scarring. No infiltrates or effusions. No worrisome pulmonary lesions. A few small scattered subpleural calcified granulomas are noted. Small wedge-shaped peripheral subpleural density in the right middle lobe is likely an area atelectasis. The central tracheobronchial tree is unremarkable. Upper Abdomen: Diffuse and marked fatty infiltration of the liver. Suspect splenomegaly. Advanced vascular calcifications. Musculoskeletal: No chest wall mass, supraclavicular or axillary adenopathy. The bony thorax is intact. IMPRESSION: 1. Moderate atherosclerotic calcifications around the aortic valve and  also at the aortic arch. 2. Extensive three-vessel coronary artery calcifications. 3. Emphysematous changes and areas of pulmonary scarring but no acute pulmonary findings or worrisome pulmonary lesions. 4. Diffuse and marked fatty infiltration of the liver. 5. Suspect splenomegaly. Aortic Atherosclerosis (ICD10-I70.0) and Emphysema (ICD10-J43.9). Electronically Signed   By: Marijo Sanes M.D.   On: 07/29/2022 14:28   CARDIAC CATHETERIZATION  Result Date: 07/28/2022   Prox RCA to Mid RCA lesion is 65% stenosed.   Mid RCA lesion is 90% stenosed.  Irregular, eccentric calcified   Dist RCA lesion is 95% stenosed. ->  Almost channel due to eccentric location   Mid LAD-1 lesion is 90% stenosed with 30% stenosed side branch in 1st Diag.  Heavily calcified   Mid LAD-2 lesion is 80% stenosed.  Heavily calcified   Dist LAD lesion is 90% stenosed.  Heavily calcified   1st Diag lesion is 70% stenosed.   ----------------------------------   LV end diastolic pressure is mildly elevated.   There is no aortic valve stenosis. POST-OPERATIVE DIAGNOSIS:  Severe heavily calcified two-vessel CAD: LAD 90% eccentric/irregular stenosis at major D1 branch that has diffuse disease as well.  This lesion is then followed by tandem 80% and 90% eccentric calcified lesions.  Relatively long segment of diffuse disease heavily calcified; downstream LAD beyond this lesion is relatively free of disease. Highly calcified RCA with mid segment smooth heavily calcified eccentric 60 to 70% stenosis followed by a 90% irregular almost ulcerated calcified plaque just at the been followed by a 99% very eccentric lesion just prior to the bifurcation into the PDA and PL.  The downstream vessels are relatively free of disease. Relatively normal LCx with a major lateral OM 1, relatively small OM 2 and small LPL 1. Mildly elevated LVEDP; normal EF by Echo earlier this month. PLAN OF CARE: Admit to inpatient  -> restart IV heparin 2 hours after zephyr band  removal; will call CVTS to discuss CABG options with the knowledge that percutaneous options are still available.  However talking with Dr. Irish Lack the patient has not been very compliant with medications and therefore would not be favorable for extensive atherectomy PCI with multiple stents (potentially even bifurcation) if he is not to be compliant with taking his medications. He will need titration of his antianginals, statin and glycemic control.  Remainder the plan per H&P from Melina Copa, [place].   DG Chest 2 View  Result Date: 07/28/2022 CLINICAL DATA:  Chest pain EXAM: CHEST - 2 VIEW COMPARISON:  Chest x-ray dated December 24, 2016 FINDINGS: The heart size and mediastinal contours are within normal limits. Both lungs are clear. The visualized skeletal structures are unremarkable. IMPRESSION: No active cardiopulmonary disease. Electronically Signed   By: Yetta Glassman M.D.   On: 07/28/2022 13:26   VAS [place] LOWER EXTREMITY ARTERIAL DUPLEX  Result Date: 07/24/2022 LOWER EXTREMITY ARTERIAL DUPLEX STUDY Patient Name:  CHRLES SELLEY  Date of Exam:   07/24/2022 Medical  Rec #: [id number]      Accession #:    [id number] Date of Birth: [birth year]      Patient Gender: M Patient Age:   31 years Exam Location:  [facility] Procedure:      VAS [place] LOWER EXTREMITY ARTERIAL DUPLEX Referring Phys: Ermalinda Barrios --------------------------------------------------------------------------------  Indications: Peripheral artery disease, and patient reports bilateral              claudication symptoms throughout the anterior lower legs for              greater than one year, left worse than right. He states that after              walking about 40 feet he notices the left leg is dragging. High Risk Factors: Hypertension, hyperlipidemia, current smoker, prior CVA.  Current ABI: .82 on the right and .77 on the left Comparison Study: NA Performing Technologist: Sharlett Iles RVT  Examination Guidelines: A complete evaluation includes  B-mode imaging, spectral Doppler, color Doppler, and power Doppler as needed of all accessible portions of each vessel. Bilateral testing is considered an integral part of a complete examination. Limited examinations for reoccurring indications may be performed as noted.  +-----------+--------+-----+---------------+----------------+------------------+ RIGHT      PSV cm/sRatioStenosis       Waveform        Comments           +-----------+--------+-----+---------------+----------------+------------------+ CFA Prox   126                         nearly triphasic                   +-----------+--------+-----+---------------+----------------+------------------+ CFA Mid    328     2.5  50-74% stenosisbiphasic        turbulent          +-----------+--------+-----+---------------+----------------+------------------+ CFA Distal 175          30-49% stenosisbiphasic        turbulent          +-----------+--------+-----+---------------+----------------+------------------+ DFA        132                         triphasic       turbulent          +-----------+--------+-----+---------------+----------------+------------------+ SFA Prox   158          30-49% stenosisbiphasic        low end range      +-----------+--------+-----+---------------+----------------+------------------+ SFA Mid    71                          multiphasic                        +-----------+--------+-----+---------------+----------------+------------------+ SFA Distal 486     6.1  75-99% stenosistriphasic       turbulent, distal                                                         SFA/AK popliteal  artery             +-----------+--------+-----+---------------+----------------+------------------+ POP Prox   38                          biphasic                            +-----------+--------+-----+---------------+----------------+------------------+ POP Mid    96                          biphasic                           +-----------+--------+-----+---------------+----------------+------------------+ POP Distal 33                          biphasic                           +-----------+--------+-----+---------------+----------------+------------------+ TP Trunk   48                          biphasic                           +-----------+--------+-----+---------------+----------------+------------------+ ATA Prox   30                          biphasic                           +-----------+--------+-----+---------------+----------------+------------------+ ATA Mid    35                          biphasic                           +-----------+--------+-----+---------------+----------------+------------------+ ATA Distal 36                          biphasic                           +-----------+--------+-----+---------------+----------------+------------------+ PTA Prox   28                          biphasic                           +-----------+--------+-----+---------------+----------------+------------------+ PTA Mid    29                          biphasic                           +-----------+--------+-----+---------------+----------------+------------------+ PTA Distal 23                          biphasic                           +-----------+--------+-----+---------------+----------------+------------------+ PERO Prox  28  biphasic                           +-----------+--------+-----+---------------+----------------+------------------+ PERO Mid   26                          biphasic                           +-----------+--------+-----+---------------+----------------+------------------+ PERO Distal19                          biphasic                            +-----------+--------+-----+---------------+----------------+------------------+ A focal velocity elevation of 328 cm/s was obtained at mid CFA with post stenotic turbulence with a VR of 2.5. Findings are characteristic of 50-74% stenosis. A 2nd focal velocity elevation was visualized, measuring 486 cm/s at distal SFA/AK popliteal artery with post stenotic turbulence with a VR of 6.1. Findings are characteristic of 75-99% stenosis.  +-----------+--------+-----+---------------+----------+------------------------+ LEFT       PSV cm/sRatioStenosis       Waveform  Comments                 +-----------+--------+-----+---------------+----------+------------------------+ CFA Prox   315          50-74% stenosisbiphasic  turbulent                +-----------+--------+-----+---------------+----------+------------------------+ CFA Mid    231          30-49% stenosistriphasic turbulent                +-----------+--------+-----+---------------+----------+------------------------+ CFA Distal 143                         triphasic turbulent                +-----------+--------+-----+---------------+----------+------------------------+ DFA        185                         triphasic                          +-----------+--------+-----+---------------+----------+------------------------+ SFA Prox   108                         biphasic  turbulent                +-----------+--------+-----+---------------+----------+------------------------+ SFA Mid    35                          monophasic                         +-----------+--------+-----+---------------+----------+------------------------+ SFA Distal 0            occluded                 bulky calcifications  with shadowing, possible                                                  short segment occlusion   +-----------+--------+-----+---------------+----------+------------------------+ POP Prox   25                          monophasic                         +-----------+--------+-----+---------------+----------+------------------------+ POP Mid    32                          monophasic                         +-----------+--------+-----+---------------+----------+------------------------+ POP Distal 28                          monophasic                         +-----------+--------+-----+---------------+----------+------------------------+ TP Trunk   32                          monophasic                         +-----------+--------+-----+---------------+----------+------------------------+ ATA Prox   33                          monophasic                         +-----------+--------+-----+---------------+----------+------------------------+ ATA Mid    23                          monophasic                         +-----------+--------+-----+---------------+----------+------------------------+ ATA Distal 23                          monophasic                         +-----------+--------+-----+---------------+----------+------------------------+ PTA Prox   40                          monophasic                         +-----------+--------+-----+---------------+----------+------------------------+ PTA Mid    19                          monophasic                         +-----------+--------+-----+---------------+----------+------------------------+ PTA Distal 13                          monophasic                         +-----------+--------+-----+---------------+----------+------------------------+  PERO Prox  20                          monophasic                         +-----------+--------+-----+---------------+----------+------------------------+ PERO Mid   20                          monophasic                          +-----------+--------+-----+---------------+----------+------------------------+ PERO Distal18                          monophasic                         +-----------+--------+-----+---------------+----------+------------------------+  Summary: Right: Atherosclerosis throughout. 50-74% stenosis in the mid CFA. 30-49% stenosis in the distal CFA. 30-49% stenosis in the ostial SFA, low end range. 75-99% stenosis in the distal SFA / AK popliteal artery. Three vessel run-off. Left: Atherosclerosis throughout. 50-74% stenosis in the proximal CFA. 30-49% stenosis in the mid CFA. Short segment occlusion in the distal SFA, with immediate reconstitution of the flow in the distal segment. Three vessel run-off.  See table(s) above for measurements and observations. See ABI report. Suggest Peripheral Vascular Consult. Electronically signed by [name] MD on 07/24/2022 at 5:32:50 PM.    Final    VAS [place] ABI WITH/WO TBI  Result Date: 07/24/2022  LOWER EXTREMITY DOPPLER STUDY Patient Name:  JOHNDAVID GERALDS  Date of Exam:   07/24/2022 Medical Rec #: [id number]      Accession #:    [id number] Date of Birth: 04-07-1956      Patient Gender: M Patient Age:   33 years Exam Location:  [facility] Procedure:      VAS [place] ABI WITH/WO TBI Referring Phys: Ermalinda Barrios --------------------------------------------------------------------------------  Indications: Peripheral artery disease. Patient reports bilateral claudication              symptoms throughout the anterior lower legs for greater than one              year, left worse than right. He states that after walking about 40              feet he notices the left leg is dragging. High Risk Factors: Hypertension, hyperlipidemia, current smoker, prior CVA.  Comparison Study: In 07/2017, a lower arterial Doppler performed at                   [facility] showed an ABI of .95 on                   the right and .98 on the left. . Performing Technologist:  Sharlett Iles RVT  Examination Guidelines: A complete evaluation includes at minimum, Doppler waveform signals and systolic blood pressure reading at the level of bilateral brachial, anterior tibial, and posterior tibial arteries, when vessel segments are accessible. Bilateral testing is considered an integral part of a complete examination. Photoelectric Plethysmograph (PPG) waveforms and toe systolic pressure readings are included as required and additional duplex testing as needed. Limited examinations for reoccurring indications may be performed as noted.  ABI Findings: +---------+------------------+-----+-----------+--------+ Right    Rt Pressure (mmHg)IndexWaveform   Comment  +---------+------------------+-----+-----------+--------+  Brachial 145                                        +---------+------------------+-----+-----------+--------+ PTA      121               0.82 multiphasic         +---------+------------------+-----+-----------+--------+ PERO     112               0.76 multiphasic         +---------+------------------+-----+-----------+--------+ DP       109               0.74 multiphasic         +---------+------------------+-----+-----------+--------+ Great Toe126               0.86 Normal              +---------+------------------+-----+-----------+--------+ +---------+------------------+-----+-----------+-------+ Left     Lt Pressure (mmHg)IndexWaveform   Comment +---------+------------------+-----+-----------+-------+ Brachial 147                                       +---------+------------------+-----+-----------+-------+ PTA      113               0.77 multiphasic        +---------+------------------+-----+-----------+-------+ PERO     101               0.70 multiphasic        +---------+------------------+-----+-----------+-------+ DP       107               0.73 multiphasic         +---------+------------------+-----+-----------+-------+ Great Toe115               0.78 Abnormal           +---------+------------------+-----+-----------+-------+ +-------+-----------+-----------+------------+------------+ ABI/TBIToday's ABIToday's TBIPrevious ABIPrevious TBI +-------+-----------+-----------+------------+------------+ Right  .82        .86        .95         .69          +-------+-----------+-----------+------------+------------+ Left   .77        .78        .98         .58          +-------+-----------+-----------+------------+------------+  Bilateral ABIs appear decreased compared to prior study on 07/26/2017. Bilateral TBIs appear increased compared to prior study on 07/26/2017.  Summary: Right: Resting right ankle-brachial index indicates mild right lower extremity arterial disease. The right toe-brachial index is normal. Left: Resting left ankle-brachial index indicates moderate left lower extremity arterial disease. The left toe-brachial index is normal. *See table(s) above for measurements and observations. See LE Arterial duplex report. Suggest Peripheral Vascular Consult. Electronically signed by [name] MD on 07/24/2022 at 5:31:10 PM.    Final    ECHOCARDIOGRAM COMPLETE  Result Date: 07/10/2022    ECHOCARDIOGRAM REPORT   Patient Name:   PATON CRUM Date of Exam: 07/10/2022 Medical Rec #:  [id number]     Height:       69.0 in Accession #:    [id number]    Weight:       201.0 lb Date of Birth:  24-Feb-1956     BSA:  2.070 m Patient Age:    30 years      BP:           128/80 mmHg Patient Gender: M             HR:           55 bpm. Exam Location:  [facility] Procedure: 2D Echo, Cardiac Doppler and Color Doppler Indications:    R07.9 Chest pain  History:        Patient has prior history of Echocardiogram examinations, most                 recent 09/17/2019. CAD, Stroke; Risk Factors:Current Smoker,                 Hypertension and Dyslipidemia.   Sonographer:    Wilford Sports Rodgers-Jones RDCS Referring Phys: [place]  1. Left ventricular ejection fraction, by estimation, is 60 to 65%. The left ventricle has normal function. The left ventricle has no regional wall motion abnormalities. Left ventricular diastolic parameters are consistent with Grade I diastolic dysfunction (impaired relaxation).  2. Right ventricular systolic function is normal. The right ventricular size is normal.  3. The mitral valve is normal in structure. Trivial mitral valve regurgitation. No evidence of mitral stenosis.  4. The aortic valve is tricuspid. Aortic valve regurgitation is not visualized. Aortic valve sclerosis is present, with no evidence of aortic valve stenosis.  5. Aortic dilatation noted. There is mild dilatation of the aortic root, measuring 40 mm. There is borderline dilatation of the ascending aorta, measuring 39 mm.  6. The inferior vena cava is normal in size with greater than 50% respiratory variability, suggesting right atrial pressure of 3 mmHg. Comparison(s): No significant change from prior study. FINDINGS  Left Ventricle: Left ventricular ejection fraction, by estimation, is 60 to 65%. The left ventricle has normal function. The left ventricle has no regional wall motion abnormalities. The left ventricular internal cavity size was normal in size. There is  no left ventricular hypertrophy. Left ventricular diastolic parameters are consistent with Grade I diastolic dysfunction (impaired relaxation). Right Ventricle: The right ventricular size is normal. Right ventricular systolic function is normal. Left Atrium: Left atrial size was normal in size. Right Atrium: Right atrial size was normal in size. Pericardium: There is no evidence of pericardial effusion. Mitral Valve: The mitral valve is normal in structure. Trivial mitral valve regurgitation. No evidence of mitral valve stenosis. Tricuspid Valve: The tricuspid valve is normal in  structure. Tricuspid valve regurgitation is trivial. No evidence of tricuspid stenosis. Aortic Valve: The aortic valve is tricuspid. Aortic valve regurgitation is not visualized. Aortic valve sclerosis is present, with no evidence of aortic valve stenosis. Pulmonic Valve: The pulmonic valve was normal in structure. Pulmonic valve regurgitation is trivial. No evidence of pulmonic stenosis. Aorta: Aortic dilatation noted. There is mild dilatation of the aortic root, measuring 40 mm. There is borderline dilatation of the ascending aorta, measuring 39 mm. Venous: The inferior vena cava is normal in size with greater than 50% respiratory variability, suggesting right atrial pressure of 3 mmHg. IAS/Shunts: No atrial level shunt detected by color flow Doppler.  LEFT VENTRICLE PLAX 2D LVIDd:         4.90 cm   Diastology LVIDs:         2.80 cm   LV e' medial:    6.80 cm/s LV PW:         1.10 cm   LV E/e'  medial:  8.5 LV IVS:        1.10 cm   LV e' lateral:   7.72 cm/s LVOT diam:     2.20 cm   LV E/e' lateral: 7.5 LV SV:         97 LV SV Index:   47 LVOT Area:     3.80 cm  RIGHT VENTRICLE RV Basal diam:  3.90 cm RV S prime:     16.95 cm/s TAPSE (M-mode): 2.9 cm LEFT ATRIUM             Index        RIGHT ATRIUM           Index LA diam:        4.20 cm 2.03 cm/m   RA Area:     13.90 cm LA Vol (A2C):   52.7 ml 25.45 ml/m  RA Volume:   38.10 ml  18.40 ml/m LA Vol (A4C):   46.3 ml 22.36 ml/m LA Biplane Vol: 49.6 ml 23.96 ml/m  AORTIC VALVE LVOT Vmax:   129.00 cm/s LVOT Vmean:  74.550 cm/s LVOT VTI:    0.256 m  AORTA Ao Root diam: 4.00 cm Ao Asc diam:  3.95 cm MITRAL VALVE MV Area (PHT): 3.42 cm    SHUNTS MV Decel Time: 222 msec    Systemic VTI:  0.26 m MV E velocity: 57.60 cm/s  Systemic Diam: 2.20 cm MV A velocity: 63.90 cm/s MV E/A ratio:  0.90 Kirk Ruths MD Electronically signed by Kirk Ruths MD Signature Date/Time: 07/10/2022/1:55:37 PM    Final      Results for orders placed or performed during the hospital  encounter of 07/28/22 (from the past 48 hour(s))  Glucose, capillary     Status: Abnormal   Collection Time: 08/04/22  3:49 PM  Result Value Ref Range   Glucose-Capillary 108 (H) 70 - 99 mg/dL    Comment: Glucose reference range applies only to samples taken after fasting for at least 8 hours.  Glucose, capillary     Status: None   Collection Time: 08/04/22  9:47 PM  Result Value Ref Range   Glucose-Capillary 99 70 - 99 mg/dL    Comment: Glucose reference range applies only to samples taken after fasting for at least 8 hours.  Basic metabolic panel     Status: Abnormal   Collection Time: 08/05/22  4:35 AM  Result Value Ref Range   Sodium 137 135 - 145 mmol/L   Potassium 4.4 3.5 - 5.1 mmol/L   Chloride 101 98 - 111 mmol/L   CO2 28 22 - 32 mmol/L   Glucose, Bld 90 70 - 99 mg/dL    Comment: Glucose reference range applies only to samples taken after fasting for at least 8 hours.   BUN 53 (H) 8 - 23 mg/dL   Creatinine, Ser 1.42 (H) 0.61 - 1.24 mg/dL   Calcium 9.0 8.9 - 10.3 mg/dL   GFR, Estimated 54 (L) >60 mL/min    Comment: (NOTE) Calculated using the CKD-EPI Creatinine Equation (2021)    Anion gap 8 5 - 15    Comment: Performed at [name] [address] [place] [address]  CBC     Status: Abnormal   Collection Time: 08/05/22  4:35 AM  Result Value Ref Range   WBC 8.0 4.0 - 10.5 K/uL   RBC 3.28 (L) 4.22 - 5.81 MIL/uL   Hemoglobin 10.0 (L) 13.0 - 17.0 g/dL   HCT  30.9 (L) 39.0 - 52.0 %   MCV 94.2 80.0 - 100.0 fL   MCH 30.5 26.0 - 34.0 pg   MCHC 32.4 30.0 - 36.0 g/dL   RDW 12.5 11.5 - 15.5 %   Platelets 254 150 - 400 K/uL   nRBC 0.0 0.0 - 0.2 %    Comment: Performed at [facility], [place] [address] [place], [place] [address]  Glucose, capillary     Status: None   Collection Time: 08/05/22  6:47 AM  Result Value Ref Range   Glucose-Capillary 91 70 - 99 mg/dL    Comment: Glucose reference range applies only to samples taken after fasting for at least  8 hours.  Glucose, capillary     Status: Abnormal   Collection Time: 08/05/22 11:39 AM  Result Value Ref Range   Glucose-Capillary 103 (H) 70 - 99 mg/dL    Comment: Glucose reference range applies only to samples taken after fasting for at least 8 hours.  Glucose, capillary     Status: Abnormal   Collection Time: 08/05/22  4:09 PM  Result Value Ref Range   Glucose-Capillary 110 (H) 70 - 99 mg/dL    Comment: Glucose reference range applies only to samples taken after fasting for at least 8 hours.  Glucose, capillary     Status: Abnormal   Collection Time: 08/05/22  9:09 PM  Result Value Ref Range   Glucose-Capillary 107 (H) 70 - 99 mg/dL    Comment: Glucose reference range applies only to samples taken after fasting for at least 8 hours.   Comment 1 Notify RN    Comment 2 Document in Chart   Glucose, capillary     Status: None   Collection Time: 08/06/22  6:19 AM  Result Value Ref Range   Glucose-Capillary 93 70 - 99 mg/dL    Comment: Glucose reference range applies only to samples taken after fasting for at least 8 hours.   Comment 1 Notify RN    Comment 2 Document in Chart     Treatments: surgery:   Operative Report    DATE OF PROCEDURE: 07/30/2022   OPERATION:   1.  Coronary artery bypass grafting x3 (left internal mammary artery to LAD using a free mammary graft, saphenous vein graft to diagonal, saphenous vein graft to posterior descending). 2.  Endoscopic harvest of right leg greater saphenous vein.   SURGEON:  Len Childs, MD   ASSISTANT:  Jadene Pierini, PA-C   PREOPERATIVE DIAGNOSES:   1.  Non-ST elevation myocardial infarction.  2.  Severe multivessel coronary artery disease. 3.  Left subclavian stenosis.   POSTOPERATIVE DIAGNOSES:   1.  Non-ST elevation myocardial infarction.  2.  Severe multivessel coronary artery disease. 3.  Left subclavian stenosis.  Discharge Exam: Blood pressure 119/76, pulse 82, temperature 97.6 F (36.4 C), temperature source  Oral, resp. rate 18, height _0  (1.753 m), weight 82 kg, SpO2 100 %.    General appearance: alert, cooperative, and no distress Heart: regular rate and rhythm Lungs: minor coarseness Abdomen: benign Extremities: no edema Wound: incis healing well  Discharge Medications:  The patient has been discharged on:   1.Beta Blocker:  Yes [ y  ]                              No   [   ]  If No, reason:  2.Ace Inhibitor/ARB: Yes [   ]                                     No  [ n   ]                                     If No, reason:labile BP  3.Statin:   Yes [ y  ]                  No  [   ]                  If No, reason:  4.Ecasa:  Yes  [   ]                  No   [  y ]                  If No, reason:  Patient had ACS upon admission:  Plavix/P2Y12 inhibitor: Yes [  y ]                                      No  [   ]     Discharge Instructions     Amb Referral to Cardiac Rehabilitation   Complete by: As directed    Diagnosis: CABG   CABG X ___: 3   After initial evaluation and assessments completed: Virtual Based Care may be provided alone or in conjunction with Phase 2 Cardiac Rehab based on patient barriers.: Yes   Intensive Cardiac Rehabilitation (ICR) [place] location only OR Traditional Cardiac Rehabilitation (TCR) *If criteria for ICR are not met will enroll in TCR [facility] only): Yes      Allergies as of 08/06/2022   No Known Allergies      Medication List     STOP taking these medications    amLODipine 5 MG tablet Commonly known as: NORVASC   HYDROcodone-acetaminophen 5-325 MG tablet Commonly known as: NORCO/VICODIN   isosorbide mononitrate 30 MG 24 hr tablet Commonly known as: IMDUR   nitroGLYCERIN 0.4 MG SL tablet Commonly known as: NITROSTAT   omeprazole 40 MG capsule Commonly known as: PRILOSEC   valsartan 160 MG tablet Commonly known as: DIOVAN       TAKE these medications    amiodarone 200 MG tablet Commonly  known as: PACERONE Take 1 tablet (200 mg total) by mouth 2 (two) times daily.   aspirin EC 81 MG tablet Take 81 mg by mouth daily.   clopidogrel 75 MG tablet Commonly known as: PLAVIX Take 1 tablet (75 mg total) by mouth daily.   ezetimibe 10 MG tablet Commonly known as: ZETIA Take 1 tablet (10 mg total) by mouth daily.   fenofibrate 145 MG tablet Commonly known as: TRICOR Take 145 mg by mouth daily.   levothyroxine 125 MCG tablet Commonly known as: SYNTHROID Take 125 mcg by mouth daily.   metoprolol tartrate 25 MG tablet Commonly known as: LOPRESSOR Take 0.5 tablets (12.5 mg total) by mouth 2 (two) times daily.   oxyCODONE 5 MG immediate release tablet Commonly known as: Oxy IR/ROXICODONE Take 1 tablet (5 mg total) by mouth every 6 (  six) hours as needed for severe pain.   pantoprazole 40 MG tablet Commonly known as: Protonix Take 1 tablet (40 mg total) by mouth daily.   rosuvastatin 40 MG tablet Commonly known as: CRESTOR Take 40 mg by mouth every morning.               Durable Medical Equipment  (From admission, onward)           Start     Ordered   08/06/22 0916  For home use only DME 4 wheeled rolling walker with seat  Once       Question:  Patient needs a walker to treat with the following condition  Answer:  S/P CABG (coronary artery bypass graft)   08/06/22 0915   08/06/22 0916  For home use only DME Bedside commode  Once       Question:  Patient needs a bedside commode to treat with the following condition  Answer:  S/P CABG (coronary artery bypass graft)   08/06/22 0915            Follow-up Information     [name], MD Follow up.   Specialty: Cardiothoracic Surgery Why: Please see discharge paperwork for details for follow-up appointment with Dr. Darcey Nora. Contact information: [address] [name] [address] [phone]         [facility] Follow up.   Why: Please obtain a chest x-ray at  [place] 1 hour prior to your appointment with Dr. Darcey Nora. Contact information: [place]        Jettie Booze, MD Follow up.   Specialties: Cardiology, Radiology, Interventional Cardiology Why: Please see discharge paperwork for details of appointment with cardiology.  You may see an advanced practice provider.  Also obtain a MYCHART ACCOUNT to keep track of your appointments. Contact information: [address] [address] [phone]         Wellington Hampshire, MD Follow up.   Specialty: Cardiology Why: We moved your peripheral vascular disease consult appointment with Dr. Fletcher Anon from 08/11/22 to 08/25/22 at 11:00am (arrival time 10:45am) to allow more time for recovery post-surgery. Note this is at the [facility] location. Contact information: [address] [place] [address] [phone]                 Signed:  John Giovanni, PA-C  08/06/2022, 12:40 PM

## 2022-08-01 ENCOUNTER — Inpatient Hospital Stay (HOSPITAL_COMMUNITY): Payer: Medicare HMO

## 2022-08-01 LAB — RENAL FUNCTION PANEL
Albumin: 3.1 g/dL — ABNORMAL LOW (ref 3.5–5.0)
Anion gap: 12 (ref 5–15)
BUN: 36 mg/dL — ABNORMAL HIGH (ref 8–23)
CO2: 25 mmol/L (ref 22–32)
Calcium: 8.4 mg/dL — ABNORMAL LOW (ref 8.9–10.3)
Chloride: 99 mmol/L (ref 98–111)
Creatinine, Ser: 1.79 mg/dL — ABNORMAL HIGH (ref 0.61–1.24)
GFR, Estimated: 41 mL/min — ABNORMAL LOW (ref >60)
Glucose, Bld: 136 mg/dL — ABNORMAL HIGH (ref 70–99)
Phosphorus: 2.5 mg/dL (ref 2.5–4.6)
Potassium: 3.9 mmol/L (ref 3.5–5.1)
Sodium: 136 mmol/L (ref 135–145)

## 2022-08-01 LAB — CBC
HCT: 25.8 % — ABNORMAL LOW (ref 39.0–52.0)
Hemoglobin: 9 g/dL — ABNORMAL LOW (ref 13.0–17.0)
MCH: 31.8 pg (ref 26.0–34.0)
MCHC: 34.9 g/dL (ref 30.0–36.0)
MCV: 91.2 fL (ref 80.0–100.0)
Platelets: 144 10*3/uL — ABNORMAL LOW (ref 150–400)
RBC: 2.83 MIL/uL — ABNORMAL LOW (ref 4.22–5.81)
RDW: 12.8 % (ref 11.5–15.5)
WBC: 11.4 10*3/uL — ABNORMAL HIGH (ref 4.0–10.5)
nRBC: 0 % (ref 0.0–0.2)

## 2022-08-01 LAB — BASIC METABOLIC PANEL
Anion gap: 7 (ref 5–15)
BUN: 29 mg/dL — ABNORMAL HIGH (ref 8–23)
CO2: 23 mmol/L (ref 22–32)
Calcium: 8.1 mg/dL — ABNORMAL LOW (ref 8.9–10.3)
Chloride: 105 mmol/L (ref 98–111)
Creatinine, Ser: 1.88 mg/dL — ABNORMAL HIGH (ref 0.61–1.24)
GFR, Estimated: 39 mL/min — ABNORMAL LOW (ref >60)
Glucose, Bld: 148 mg/dL — ABNORMAL HIGH (ref 70–99)
Potassium: 4 mmol/L (ref 3.5–5.1)
Sodium: 135 mmol/L (ref 135–145)

## 2022-08-01 LAB — GLUCOSE, CAPILLARY
Glucose-Capillary: 128 mg/dL — ABNORMAL HIGH (ref 70–99)
Glucose-Capillary: 131 mg/dL — ABNORMAL HIGH (ref 70–99)
Glucose-Capillary: 132 mg/dL — ABNORMAL HIGH (ref 70–99)
Glucose-Capillary: 136 mg/dL — ABNORMAL HIGH (ref 70–99)
Glucose-Capillary: 142 mg/dL — ABNORMAL HIGH (ref 70–99)
Glucose-Capillary: 153 mg/dL — ABNORMAL HIGH (ref 70–99)
Glucose-Capillary: 162 mg/dL — ABNORMAL HIGH (ref 70–99)

## 2022-08-01 LAB — POCT I-STAT 7, (LYTES, BLD GAS, ICA,H+H)
Acid-base deficit: 2 mmol/L (ref 0.0–2.0)
Bicarbonate: 23.1 mmol/L (ref 20.0–28.0)
Calcium, Ion: 1.19 mmol/L (ref 1.15–1.40)
HCT: 27 % — ABNORMAL LOW (ref 39.0–52.0)
Hemoglobin: 9.2 g/dL — ABNORMAL LOW (ref 13.0–17.0)
O2 Saturation: 96 %
Patient temperature: 37.3
Potassium: 4.1 mmol/L (ref 3.5–5.1)
Sodium: 135 mmol/L (ref 135–145)
TCO2: 24 mmol/L (ref 22–32)
pCO2 arterial: 41.9 mmHg (ref 32–48)
pH, Arterial: 7.352 (ref 7.35–7.45)
pO2, Arterial: 86 mmHg (ref 83–108)

## 2022-08-01 MED ORDER — SODIUM CHLORIDE 0.9% FLUSH
10.0000 mL | Freq: Two times a day (BID) | INTRAVENOUS | Status: DC
  Administered 2022-08-01 – 2022-08-05 (×9): 10 mL
  Administered 2022-08-05: 20 mL

## 2022-08-01 MED ORDER — METOLAZONE 5 MG PO TABS
5.0000 mg | ORAL_TABLET | Freq: Once | ORAL | Status: AC
  Administered 2022-08-01: 5 mg via ORAL
  Filled 2022-08-01: qty 1

## 2022-08-01 MED ORDER — SODIUM CHLORIDE 0.9% FLUSH: 10.0000 mL | INTRAVENOUS | Status: DC | PRN

## 2022-08-01 NOTE — Progress Notes (Signed)
Patient ID: Carl [name], male   DOB: Jul 20, 1956, 66 y.o.   MRN: [id number] [facility] Evening Rounds:  Hemodynamically stable in sinus rhythm.  Diuresing well. -1200 cc so far today.  Still on 15 L HFNC but comfortable and sats 95%.

## 2022-08-01 NOTE — Progress Notes (Signed)
2 Days Post-Op Procedure(s) (LRB): CORONARY ARTERY BYPASS GRAFTING (CABG) ([name]) TRANSESOPHAGEAL ECHOCARDIOGRAM (TEE) ([name]) Subjective: Feels better.  On Bipap overnight but on 15L HFNC this am with sats 93%  ABG ok this am.  Milrinone 0.125.  Objective: Vital signs in last 24 hours: Temp:  [97.6 F (36.4 C)-99.9 F (37.7 C)] 97.6 F (36.4 C) (11/11 1132) Pulse Rate:  [84-99] 92 (11/11 1229) Cardiac Rhythm: Normal sinus rhythm (11/11 1252) Resp:  [14-34] 22 (11/11 1229) BP: (89-127)/(53-108) 123/66 (11/11 1200) SpO2:  [87 %-100 %] 93 % (11/11 1229) Arterial Line BP: (70-151)/(44-95) 129/55 (11/11 1200) FiO2 (%):  [80 %-100 %] 80 % (11/11 0827) Weight:  [93.2 kg] 93.2 kg (11/11 0500)  Hemodynamic parameters for last 24 hours:    Intake/Output from previous day: 11/10 0701 - 11/11 0700 In: 1494.1 [I.V.:1027.4; IV Piggyback:466.7] Out: 1290 [Urine:1050; Chest Tube:240] Intake/Output this shift: Total I/O In: 16.1 [I.V.:16.1] Out: 585 [Urine:545; Chest Tube:40]  General appearance: alert and cooperative Neurologic: intact Heart: regular rate and rhythm Lungs: rhonchi bilaterally Extremities: edema mild Wound: dressing dry  Lab Results: Recent Labs    07/31/22 1630 07/31/22 1722 08/01/22 0415 08/01/22 0423  WBC 17.9*  --  11.4*  --   HGB 10.1*   < > 9.0* 9.2*  HCT 29.8*   < > 25.8* 27.0*  PLT 209  --  144*  --    < > = values in this interval not displayed.   BMET:  Recent Labs    07/31/22 1630 07/31/22 1722 08/01/22 0415 08/01/22 0423  NA 134*   < > 135 135  K 3.9   < > 4.0 4.1  CL 103  --  105  --   CO2 20*  --  23  --   GLUCOSE 139*  --  148*  --   BUN 23  --  29*  --   CREATININE 2.07*  --  1.88*  --   CALCIUM 8.1*  --  8.1*  --    < > = values in this interval not displayed.    PT/INR:  Recent Labs    07/30/22 1421  LABPROT 15.2  INR 1.2   ABG    Component Value Date/Time   PHART 7.352 08/01/2022 0423   HCO3 23.1 08/01/2022 0423    TCO2 24 08/01/2022 0423   ACIDBASEDEF 2.0 08/01/2022 0423   O2SAT 96 08/01/2022 0423   CBG (last 3)  Recent Labs    08/01/22 0421 08/01/22 0818 08/01/22 1133  GLUCAP 153* 132* 136*   CXR: mild bibasilar atelectasis. Interstitial prominence.  Assessment/Plan: S/P Procedure(s) (LRB): CORONARY ARTERY BYPASS GRAFTING (CABG) ([name]) TRANSESOPHAGEAL ECHOCARDIOGRAM (TEE) ([name])  POD 2  Hemodynamically stable in sinus rhythm. Continue milrinone at 0.125 today.  Increased oxygen requirement improving. Heavy smoker although PFT's not terrible. Probably acute bronchitis exacerbated by surgery. On steroids, antibiotic, nebs. Diuresis will help. Encouraged to use IS, flutter valve.  Volume excess: continue diuresis.  AKI: creat coming back down.   DC chest tubes, arterial line.  OOB.   LOS: 3 days    Gaye Pollack 08/01/2022

## 2022-08-01 NOTE — Progress Notes (Signed)
Peripherally Inserted Central Catheter Placement  The IV Nurse has discussed with the patient and/or persons authorized to consent for the patient, the purpose of this procedure and the potential benefits and risks involved with this procedure.  The benefits include less needle sticks, lab draws from the catheter, and the patient may be discharged home with the catheter. Risks include, but not limited to, infection, bleeding, blood clot (thrombus formation), and puncture of an artery; nerve damage and irregular heartbeat and possibility to perform a PICC exchange if needed/ordered by physician.  Alternatives to this procedure were also discussed.  Bard Power PICC patient education guide, fact sheet on infection prevention and patient information card has been provided to patient /or left at bedside.    PICC Placement Documentation  PICC Double Lumen 08/01/22 Right Brachial 41 cm 0 cm (Active)  Indication for Insertion or Continuance of Line Prolonged intravenous therapies 08/01/22 0925  Exposed Catheter (cm) 0 cm 08/01/22 0925  Site Assessment Clean, Dry, Intact 08/01/22 0925  Lumen #1 Status Flushed;Saline locked;Blood return noted 08/01/22 0925  Lumen #2 Status Flushed;Saline locked;Blood return noted 08/01/22 0925  Dressing Type Transparent;Securing device 08/01/22 0925  Dressing Status Antimicrobial disc in place;Clean, Dry, Intact 08/01/22 0925  Safety Lock Not Applicable 37/85/88 5027  Line Care Connections checked and tightened 08/01/22 0925  Line Adjustment (NICU/IV Team Only) No 08/01/22 0925  Dressing Intervention New dressing 08/01/22 0925  Dressing Change Due 08/08/22 08/01/22 0925       Rolena Infante 08/01/2022, 9:25 AM

## 2022-08-02 ENCOUNTER — Inpatient Hospital Stay (HOSPITAL_COMMUNITY): Payer: Medicare HMO

## 2022-08-02 LAB — BASIC METABOLIC PANEL
Anion gap: 11 (ref 5–15)
BUN: 38 mg/dL — ABNORMAL HIGH (ref 8–23)
CO2: 25 mmol/L (ref 22–32)
Calcium: 8.6 mg/dL — ABNORMAL LOW (ref 8.9–10.3)
Chloride: 99 mmol/L (ref 98–111)
Creatinine, Ser: 1.65 mg/dL — ABNORMAL HIGH (ref 0.61–1.24)
GFR, Estimated: 46 mL/min — ABNORMAL LOW (ref >60)
Glucose, Bld: 138 mg/dL — ABNORMAL HIGH (ref 70–99)
Potassium: 3.9 mmol/L (ref 3.5–5.1)
Sodium: 135 mmol/L (ref 135–145)

## 2022-08-02 LAB — RENAL FUNCTION PANEL
Albumin: 3.3 g/dL — ABNORMAL LOW (ref 3.5–5.0)
Anion gap: 14 (ref 5–15)
BUN: 54 mg/dL — ABNORMAL HIGH (ref 8–23)
CO2: 23 mmol/L (ref 22–32)
Calcium: 8.8 mg/dL — ABNORMAL LOW (ref 8.9–10.3)
Chloride: 97 mmol/L — ABNORMAL LOW (ref 98–111)
Creatinine, Ser: 1.84 mg/dL — ABNORMAL HIGH (ref 0.61–1.24)
GFR, Estimated: 40 mL/min — ABNORMAL LOW (ref >60)
Glucose, Bld: 135 mg/dL — ABNORMAL HIGH (ref 70–99)
Phosphorus: 3.9 mg/dL (ref 2.5–4.6)
Potassium: 4.2 mmol/L (ref 3.5–5.1)
Sodium: 134 mmol/L — ABNORMAL LOW (ref 135–145)

## 2022-08-02 LAB — CBC
HCT: 26.4 % — ABNORMAL LOW (ref 39.0–52.0)
Hemoglobin: 9.1 g/dL — ABNORMAL LOW (ref 13.0–17.0)
MCH: 31.7 pg (ref 26.0–34.0)
MCHC: 34.5 g/dL (ref 30.0–36.0)
MCV: 92 fL (ref 80.0–100.0)
Platelets: 145 10*3/uL — ABNORMAL LOW (ref 150–400)
RBC: 2.87 MIL/uL — ABNORMAL LOW (ref 4.22–5.81)
RDW: 12.9 % (ref 11.5–15.5)
WBC: 9.7 10*3/uL (ref 4.0–10.5)
nRBC: 0 % (ref 0.0–0.2)

## 2022-08-02 LAB — GLUCOSE, CAPILLARY
Glucose-Capillary: 117 mg/dL — ABNORMAL HIGH (ref 70–99)
Glucose-Capillary: 127 mg/dL — ABNORMAL HIGH (ref 70–99)
Glucose-Capillary: 130 mg/dL — ABNORMAL HIGH (ref 70–99)
Glucose-Capillary: 131 mg/dL — ABNORMAL HIGH (ref 70–99)
Glucose-Capillary: 139 mg/dL — ABNORMAL HIGH (ref 70–99)
Glucose-Capillary: 140 mg/dL — ABNORMAL HIGH (ref 70–99)

## 2022-08-02 MED ORDER — POTASSIUM CHLORIDE 10 MEQ/50ML IV SOLN
10.0000 meq | INTRAVENOUS | Status: AC
  Administered 2022-08-02 (×4): 10 meq via INTRAVENOUS
  Filled 2022-08-02 (×4): qty 50

## 2022-08-02 MED ORDER — METOLAZONE 5 MG PO TABS
5.0000 mg | ORAL_TABLET | Freq: Once | ORAL | Status: AC
  Administered 2022-08-02: 5 mg via ORAL
  Filled 2022-08-02: qty 1

## 2022-08-02 MED ORDER — DIGOXIN 0.25 MG/ML IJ SOLN
0.5000 mg | Freq: Once | INTRAMUSCULAR | Status: AC
  Administered 2022-08-02: 0.5 mg via INTRAVENOUS
  Filled 2022-08-02: qty 2

## 2022-08-02 MED ORDER — DIGOXIN 0.25 MG/ML IJ SOLN
0.2500 mg | Freq: Once | INTRAMUSCULAR | Status: AC
  Administered 2022-08-02: 0.25 mg via INTRAVENOUS
  Filled 2022-08-02: qty 2

## 2022-08-02 NOTE — Progress Notes (Signed)
3 Days Post-Op Procedure(s) (LRB): CORONARY ARTERY BYPASS GRAFTING (CABG) ([name]) TRANSESOPHAGEAL ECHOCARDIOGRAM (TEE) ([name]) Subjective: Feels better. Ambulated around the ICU twice.  Has been having some atrial fib but rate controlled for the most part.  Objective: Vital signs in last 24 hours: Temp:  [97.6 F (36.4 C)-100.2 F (37.9 C)] 98.8 F (37.1 C) (11/12 0900) Pulse Rate:  [78-92] 78 (11/12 0800) Cardiac Rhythm: Normal sinus rhythm;Atrial fibrillation (11/12 0800) Resp:  [15-30] 19 (11/12 0900) BP: (100-136)/(56-86) 114/72 (11/12 0900) SpO2:  [88 %-100 %] 97 % (11/12 0807) Arterial Line BP: (129-138)/(52-55) 138/52 (11/11 1300) FiO2 (%):  [80 %] 80 % (11/12 0800) Weight:  [89.8 kg] 89.8 kg (11/12 0500)  Hemodynamic parameters for last 24 hours:    Intake/Output from previous day: 11/11 0701 - 11/12 0700 In: 398 [P.O.:120; I.V.:77.9; IV Piggyback:200.1] Out: 6945 [Urine:3455; Chest Tube:40] Intake/Output this shift: Total I/O In: 220 [P.O.:220] Out: 1100 [Urine:1100]  General appearance: alert and cooperative Neurologic: intact Heart: irregularly irregular rhythm Lungs: rhonchi bilaterally Abdomen: soft, non-tender; bowel sounds normal Extremities: edema mild Wound: dressing dry  Lab Results: Recent Labs    08/01/22 0415 08/01/22 0423 08/02/22 0339  WBC 11.4*  --  9.7  HGB 9.0* 9.2* 9.1*  HCT 25.8* 27.0* 26.4*  PLT 144*  --  145*   BMET:  Recent Labs    08/01/22 1735 08/02/22 0339  NA 136 135  K 3.9 3.9  CL 99 99  CO2 25 25  GLUCOSE 136* 138*  BUN 36* 38*  CREATININE 1.79* 1.65*  CALCIUM 8.4* 8.6*    PT/INR:  Recent Labs    07/30/22 1421  LABPROT 15.2  INR 1.2   ABG    Component Value Date/Time   PHART 7.352 08/01/2022 0423   HCO3 23.1 08/01/2022 0423   TCO2 24 08/01/2022 0423   ACIDBASEDEF 2.0 08/01/2022 0423   O2SAT 96 08/01/2022 0423   CBG (last 3)  Recent Labs    08/02/22 0007 08/02/22 0339 08/02/22 0759  GLUCAP 139*  140* 127*   CXR: Worsened aeration with increased interstitial and alveolar edema compared to yesterday.  Assessment/Plan: S/P Procedure(s) (LRB): CORONARY ARTERY BYPASS GRAFTING (CABG) ([name]) TRANSESOPHAGEAL ECHOCARDIOGRAM (TEE) ([name])  Hemodynamics have been stable although BP in low normal range.  Rhythm has been sinus until atrial fib intermittently overnight. I am hesitant to put him on amio with his lung disease and worsening edema. Can't use cardizem or increased BB with borderline BP. Will give him a dose of digoxin this am.  Volume excess: -3100 cc yesterday. Wt down 7 lbs. Still about 8 lbs over preop wt. Continue diuretic and KCL.  Severe COPD, active smoking and pulmonary edema on CXR with increased oxygen requirement postop. He is doing ok on 15L HFNC and was able to walk. Continue diuresis. He has been on short course of steroids and on empiric antibiotics. IS, flutter valve.  He wants foley out.  Keep on sips of clear liquids for now.   LOS: 4 days    Carl [name] 08/02/2022

## 2022-08-02 NOTE — Progress Notes (Signed)
Patient ID: Carl [name], male   DOB: 1956/09/20, 67 y.o.   MRN: [id number]  TCTS Evening Rounds:  Hemodynamically stable. Remains in AF with rate 100's.  Oxygenation improved and down to 10L HFNC.  -1200 cc today.  Working on IS.  BMET    Component Value Date/Time   NA 134 (L) 08/02/2022 1650   K 4.2 08/02/2022 1650   CL 97 (L) 08/02/2022 1650   CO2 23 08/02/2022 1650   GLUCOSE 135 (H) 08/02/2022 1650   BUN 54 (H) 08/02/2022 1650   CREATININE 1.84 (H) 08/02/2022 1650   CALCIUM 8.8 (L) 08/02/2022 1650   GFRNONAA 40 (L) 08/02/2022 1650   Creat up a little more this evening. Will hold pm lasix.

## 2022-08-03 ENCOUNTER — Inpatient Hospital Stay (HOSPITAL_COMMUNITY): Payer: Medicare HMO

## 2022-08-03 DIAGNOSIS — I200 Unstable angina: Secondary | ICD-10-CM | POA: Diagnosis not present

## 2022-08-03 LAB — GLUCOSE, CAPILLARY
Glucose-Capillary: 138 mg/dL — ABNORMAL HIGH (ref 70–99)
Glucose-Capillary: 120 mg/dL — ABNORMAL HIGH (ref 70–99)
Glucose-Capillary: 133 mg/dL — ABNORMAL HIGH (ref 70–99)
Glucose-Capillary: 148 mg/dL — ABNORMAL HIGH (ref 70–99)
Glucose-Capillary: 127 mg/dL — ABNORMAL HIGH (ref 70–99)
Glucose-Capillary: 124 mg/dL — ABNORMAL HIGH (ref 70–99)
Glucose-Capillary: 148 mg/dL — ABNORMAL HIGH (ref 70–99)

## 2022-08-03 LAB — BASIC METABOLIC PANEL
Anion gap: 13 (ref 5–15)
BUN: 56 mg/dL — ABNORMAL HIGH (ref 8–23)
CO2: 25 mmol/L (ref 22–32)
Calcium: 8.5 mg/dL — ABNORMAL LOW (ref 8.9–10.3)
Chloride: 97 mmol/L — ABNORMAL LOW (ref 98–111)
Creatinine, Ser: 1.8 mg/dL — ABNORMAL HIGH (ref 0.61–1.24)
GFR, Estimated: 41 mL/min — ABNORMAL LOW (ref >60)
Glucose, Bld: 125 mg/dL — ABNORMAL HIGH (ref 70–99)
Potassium: 3.8 mmol/L (ref 3.5–5.1)
Sodium: 135 mmol/L (ref 135–145)

## 2022-08-03 LAB — CBC
HCT: 29.1 % — ABNORMAL LOW (ref 39.0–52.0)
Hemoglobin: 9.5 g/dL — ABNORMAL LOW (ref 13.0–17.0)
MCH: 30.5 pg (ref 26.0–34.0)
MCHC: 32.6 g/dL (ref 30.0–36.0)
MCV: 93.6 fL (ref 80.0–100.0)
Platelets: 254 10*3/uL (ref 150–400)
RBC: 3.11 MIL/uL — ABNORMAL LOW (ref 4.22–5.81)
RDW: 12.7 % (ref 11.5–15.5)
WBC: 12.1 10*3/uL — ABNORMAL HIGH (ref 4.0–10.5)
nRBC: 0 % (ref 0.0–0.2)

## 2022-08-03 LAB — RENAL FUNCTION PANEL
Albumin: 2.9 g/dL — ABNORMAL LOW (ref 3.5–5.0)
Anion gap: 14 (ref 5–15)
BUN: 60 mg/dL — ABNORMAL HIGH (ref 8–23)
CO2: 27 mmol/L (ref 22–32)
Calcium: 8.7 mg/dL — ABNORMAL LOW (ref 8.9–10.3)
Chloride: 95 mmol/L — ABNORMAL LOW (ref 98–111)
Creatinine, Ser: 1.76 mg/dL — ABNORMAL HIGH (ref 0.61–1.24)
GFR, Estimated: 42 mL/min — ABNORMAL LOW (ref >60)
Glucose, Bld: 196 mg/dL — ABNORMAL HIGH (ref 70–99)
Phosphorus: 3.1 mg/dL (ref 2.5–4.6)
Potassium: 3.8 mmol/L (ref 3.5–5.1)
Sodium: 136 mmol/L (ref 135–145)

## 2022-08-03 LAB — DIGOXIN LEVEL: Digoxin Level: 1.3 ng/mL (ref 0.8–2.0)

## 2022-08-03 MED ORDER — AMIODARONE HCL IN DEXTROSE 360-4.14 MG/200ML-% IV SOLN
30.0000 mg/h | INTRAVENOUS | Status: DC
  Administered 2022-08-03: 60 mg/h via INTRAVENOUS
  Filled 2022-08-03 (×2): qty 200

## 2022-08-03 MED ORDER — ENOXAPARIN SODIUM 30 MG/0.3ML IJ SOSY
30.0000 mg | PREFILLED_SYRINGE | INTRAMUSCULAR | Status: DC
  Administered 2022-08-03 – 2022-08-04 (×2): 30 mg via SUBCUTANEOUS
  Filled 2022-08-03 (×3): qty 0.3

## 2022-08-03 MED ORDER — INSULIN ASPART 100 UNIT/ML IJ SOLN
0.0000 [IU] | Freq: Three times a day (TID) | INTRAMUSCULAR | Status: DC
  Administered 2022-08-03 (×3): 2 [IU] via SUBCUTANEOUS

## 2022-08-03 MED ORDER — METOCLOPRAMIDE HCL 5 MG/ML IJ SOLN
10.0000 mg | Freq: Three times a day (TID) | INTRAMUSCULAR | Status: DC
  Administered 2022-08-03 – 2022-08-04 (×3): 10 mg via INTRAVENOUS
  Filled 2022-08-03 (×3): qty 2

## 2022-08-03 MED ORDER — AMIODARONE HCL IN DEXTROSE 360-4.14 MG/200ML-% IV SOLN
30.0000 mg/h | INTRAVENOUS | Status: DC
  Administered 2022-08-04: 30 mg/h via INTRAVENOUS
  Filled 2022-08-03: qty 200

## 2022-08-03 MED ORDER — FUROSEMIDE 10 MG/ML IJ SOLN: 40.0000 mg | Freq: Every day | INTRAMUSCULAR | Status: DC

## 2022-08-03 MED ORDER — INSULIN GLARGINE-YFGN 100 UNIT/ML ~~LOC~~ SOLN
10.0000 [IU] | Freq: Every day | SUBCUTANEOUS | Status: DC
  Administered 2022-08-03: 10 [IU] via SUBCUTANEOUS
  Filled 2022-08-03 (×4): qty 0.1

## 2022-08-03 MED ORDER — FUROSEMIDE 40 MG PO TABS
40.0000 mg | ORAL_TABLET | Freq: Every day | ORAL | Status: DC
  Administered 2022-08-03: 40 mg via ORAL
  Filled 2022-08-03: qty 1

## 2022-08-03 MED ORDER — PREDNISONE 10 MG (21) PO TBPK
10.0000 mg | ORAL_TABLET | Freq: Every day | ORAL | Status: AC
  Administered 2022-08-03 – 2022-08-05 (×3): 10 mg via ORAL
  Filled 2022-08-03 (×2): qty 21

## 2022-08-03 NOTE — Significant Event (Signed)
Infusion verify correlated after pump reassociation.

## 2022-08-03 NOTE — Significant Event (Signed)
Given dose of 60 mg/hr per admin instructions. Infusion verify of 30 mg/hr, but actual dose given was 60 mg/hr and volume of 33.3 ml/hr per admin instructions.

## 2022-08-03 NOTE — Progress Notes (Signed)
      [address]       [place] [address]             [phone]      PM  rounds  Up in chair  BP 120/71   Pulse 88   Temp 98.5 F (36.9 C) (Axillary)   Resp (!) 22   Ht '5\' 9"'$  (1.753 m)   Wt 87 kg   SpO2 100%   BMI 28.32 kg/m  In SR 5L [place]  Intake/Output Summary (Last 24 hours) at 08/03/2022 1730 Last data filed at 08/03/2022 1600 Gross per 24 hour  Intake 963.35 ml  Output 3015 ml  Net -2051.65 ml    Creatinine 1.76 (stable), K 3.8  Continue current Rx  Maccoy Haubner C. Roxan Hockey, MD [facility] [phone]

## 2022-08-03 NOTE — Progress Notes (Addendum)
[facility] DAILY ICU PROGRESS NOTE                   [address]            [place] [address]          [phone]   4 Days Post-Op Procedure(s) (LRB): CORONARY ARTERY BYPASS GRAFTING (CABG) ([name]) TRANSESOPHAGEAL ECHOCARDIOGRAM (TEE) ([name])  Total Length of Stay:  LOS: 5 days   Subjective: Continues to feel better each day  Objective: Vital signs in last 24 hours: Temp:  [97.7 F (36.5 C)-99 F (37.2 C)] 98.1 F (36.7 C) (11/13 0000) Pulse Rate:  [78-106] 97 (11/13 0600) Cardiac Rhythm: Atrial fibrillation (11/13 0400) Resp:  [12-28] 16 (11/13 0600) BP: (101-136)/(54-109) 105/72 (11/13 0600) SpO2:  [90 %-100 %] 100 % (11/13 0600) FiO2 (%):  [80 %] 80 % (11/12 0800) Weight:  [87 kg] 87 kg (11/13 0500)  Filed Weights   08/01/22 0500 08/02/22 0500 08/03/22 0500  Weight: 93.2 kg 89.8 kg 87 kg    Weight change: -2.8 kg   Hemodynamic parameters for last 24 hours:    Intake/Output from previous day: 11/12 0701 - 11/13 0700 In: 734.9 [P.O.:460; I.V.:13.5; IV Piggyback:261.4] Out: 3025 [Urine:3025]  Intake/Output this shift: No intake/output data recorded.  Current Meds: Scheduled Meds:  acetaminophen  1,000 mg Oral Q6H   Or   acetaminophen (TYLENOL) oral liquid 160 mg/5 mL  1,000 mg Per Tube Q6H   aspirin EC  325 mg Oral Daily   Or   aspirin  324 mg Per Tube Daily   bisacodyl  10 mg Oral Daily   Or   bisacodyl  10 mg Rectal Daily   Chlorhexidine Gluconate Cloth  6 each Topical Daily   cyclobenzaprine  7.5 mg Oral QHS   docusate sodium  200 mg Oral Daily   fenofibrate  160 mg Oral Daily   guaiFENesin  600 mg Oral BID   insulin aspart  0-24 Units Subcutaneous Q4H   ipratropium-albuterol  3 mL Nebulization Q6H   levothyroxine  125 mcg Oral Q0600   lidocaine  2 patch Transdermal Q24H   metoCLOPramide (REGLAN) injection  10 mg Intravenous Q6H   metoprolol tartrate  12.5 mg Oral BID   Or   metoprolol tartrate  12.5 mg Per Tube BID   pantoprazole   40 mg Oral Daily   rosuvastatin  40 mg Oral q morning   sodium chloride flush  10-40 mL Intracatheter Q12H   sodium chloride flush  3 mL Intravenous Q12H   thiamine  100 mg Intravenous Daily   Continuous Infusions:  sodium chloride     ceFEPime (MAXIPIME) IV 2 g (08/03/22 0459)   PRN Meds:.fentaNYL (SUBLIMAZE) injection, metoprolol tartrate, ondansetron (ZOFRAN) IV, mouth rinse, oxyCODONE, sodium chloride flush, traMADol  General appearance: alert, cooperative, and no distress Heart: irregularly irregular rhythm Lungs: Some coarseness left greater than right Abdomen: Mild distention, positive bowel sounds, nontender Extremities: No edema Wound: Incisions healing well without evidence of infection  Lab Results: CBC: Recent Labs    08/02/22 0339 08/03/22 0437  WBC 9.7 12.1*  HGB 9.1* 9.5*  HCT 26.4* 29.1*  PLT 145* 254   BMET:  Recent Labs    08/02/22 1650 08/03/22 0437  NA 134* 135  K 4.2 3.8  CL 97* 97*  CO2 23 25  GLUCOSE 135* 125*  BUN 54* 56*  CREATININE 1.84* 1.80*  CALCIUM 8.8* 8.5*    CMET: Lab Results  Component Value Date   WBC 12.1 (H) 08/03/2022   HGB 9.5 (L) 08/03/2022   HCT 29.1 (L) 08/03/2022   PLT 254 08/03/2022   GLUCOSE 125 (H) 08/03/2022   CHOL 122 07/29/2022   TRIG 131 07/29/2022   HDL 31 (L) 07/29/2022   LDLCALC 65 07/29/2022   ALT 19 07/31/2022   AST 45 (H) 07/31/2022   NA 135 08/03/2022   K 3.8 08/03/2022   CL 97 (L) 08/03/2022   CREATININE 1.80 (H) 08/03/2022   BUN 56 (H) 08/03/2022   CO2 25 08/03/2022   TSH 0.392 07/29/2022   INR 1.2 07/30/2022   HGBA1C 5.3 07/28/2022      PT/INR: No results for input(s): "LABPROT", "INR" in the last 72 hours. Radiology: No results found.   Assessment/Plan: S/P Procedure(s) (LRB): CORONARY ARTERY BYPASS GRAFTING (CABG) ([name]) TRANSESOPHAGEAL ECHOCARDIOGRAM (TEE) ([name])  POD#4  1 afebrile, SBP 100s to 130s, A-fib, rate mostly controlled, not on amiodarone due to significant  pulmonary disease, has been started on digoxin on no inotropes or pressors 2 O2 sats good on 10 L HFNC 3 excellent diuresis, weight appears below preop Lasix ordered per MD today continues to feel better 4 blood sugars well controlled 5 renal function is stable(AKI).  BUN and creatinine 56/1.80 6 increased leukocytosis WBC 12.1-did get a course of steroids 7 expected acute blood loss anemia with improved trend with equilibration volume status 8 reactive thrombocytopenia has corrected.  Platelet count is 254K today 9 chest x-ray with interstitial and alveolar edema/ airspace disease findings continue aggressive pulmonary hygiene and nebs.  He is also been a course of steroids and empiric antibiotics, Maxipime 10 pushing pulmonary hygiene and cardiac rehab as able 11 May be able to advance diet from clears soon   John Giovanni PA-C Pager [phone] 08/03/2022 7:41 AM  Agree with above assessment and plan. Pulmonary status slowly improving but remains on 10 L O2 nasal cannula, chest x-ray is better today.  Continue gentle diuresis with p.o. Lasix, antibiotics and low-dose prednisone for bronchitis-inflammation Remains in atrial fibrillation rates up to 120.  We will stop digoxin due to elevated creatinine and start low-dose IV amiodarone Advance diet and mobilize. Start Lovenox prophylactic dose, continue pacing wires for now but will remove after he converts to sinus rhythm and then start oral Plavix.  patient examined and medical record reviewed,agree with above note. [name] 08/03/2022

## 2022-08-03 NOTE — Progress Notes (Signed)
Rounding Note    Patient Name: Carl [name] Date of Encounter: 08/03/2022  [place] Cardiologist: Larae Grooms, MD   Subjective   POD 4 after MV CABG  No acute events overnight.  Remains in AF with rates generally controlled.  Biggest complaint is leg/back pain.  Ambulated today.    Inpatient Medications    Scheduled Meds:  acetaminophen  1,000 mg Oral Q6H   Or   acetaminophen (TYLENOL) oral liquid 160 mg/5 mL  1,000 mg Per Tube Q6H   aspirin EC  325 mg Oral Daily   Or   aspirin  324 mg Per Tube Daily   bisacodyl  10 mg Oral Daily   Or   bisacodyl  10 mg Rectal Daily   Chlorhexidine Gluconate Cloth  6 each Topical Daily   cyclobenzaprine  7.5 mg Oral QHS   docusate sodium  200 mg Oral Daily   fenofibrate  160 mg Oral Daily   guaiFENesin  600 mg Oral BID   insulin aspart  0-24 Units Subcutaneous Q4H   ipratropium-albuterol  3 mL Nebulization Q6H   levothyroxine  125 mcg Oral Q0600   lidocaine  2 patch Transdermal Q24H   metoCLOPramide (REGLAN) injection  10 mg Intravenous Q6H   metoprolol tartrate  12.5 mg Oral BID   Or   metoprolol tartrate  12.5 mg Per Tube BID   pantoprazole  40 mg Oral Daily   rosuvastatin  40 mg Oral q morning   sodium chloride flush  10-40 mL Intracatheter Q12H   sodium chloride flush  3 mL Intravenous Q12H   thiamine  100 mg Intravenous Daily   Continuous Infusions:  sodium chloride     ceFEPime (MAXIPIME) IV 2 g (08/03/22 0459)   PRN Meds: fentaNYL (SUBLIMAZE) injection, metoprolol tartrate, ondansetron (ZOFRAN) IV, mouth rinse, oxyCODONE, sodium chloride flush, traMADol   Vital Signs    Vitals:   08/03/22 0300 08/03/22 0400 08/03/22 0500 08/03/22 0600  BP: 123/79 (!) 126/109 108/70 105/72  Pulse: 98 (!) 106 (!) 105 97  Resp: '19 16 18 16  '$ Temp:      TempSrc:      SpO2: 100% 100% 96% 100%  Weight:   87 kg   Height:        Intake/Output Summary (Last 24 hours) at 08/03/2022 0742 Last data filed at  08/03/2022 0600 Gross per 24 hour  Intake 734.9 ml  Output 3025 ml  Net -2290.1 ml      08/03/2022    5:00 AM 08/02/2022    5:00 AM 08/01/2022    5:00 AM  Last 3 Weights  Weight (lbs) 191 lb 12.8 oz 197 lb 15.6 oz 205 lb 7.5 oz  Weight (kg) 87 kg 89.8 kg 93.2 kg      Telemetry    AF with PVCs, rates typically 80-120s- Personally Reviewed  ECG    SB with TW changes- Personally Reviewed  Physical Exam   GEN: No acute distress.   Neck: No JVD Cardiac: RRR, no murmurs, rubs, or gallops.  Respiratory: Clear to auscultation bilaterally. GI: Soft, nontender, non-distended  MS: No edema; No deformity. Neuro:  Nonfocal  Psych: Normal affect   Labs    High Sensitivity Troponin:   Recent Labs  Lab 07/28/22 1303 07/28/22 1800  TROPONINIHS 3,095* 3,052*     Chemistry Recent Labs  Lab 07/29/22 1553 07/30/22 0253 07/30/22 2018 07/31/22 0401 07/31/22 0437 07/31/22 1630 07/31/22 1722 08/01/22 1735 08/02/22 0339 08/02/22 1650 08/03/22  0437  NA 139   < > 137 130*   < > 134*   < > 136 135 134* 135  K 4.0   < > 4.3 4.3   < > 3.9   < > 3.9 3.9 4.2 3.8  CL 109   < > 107 103  --  103   < > 99 99 97* 97*  CO2 22   < > 18* 18*  --  20*   < > '25 25 23 25  '$ GLUCOSE 100*   < > 138* 131*  --  139*   < > 136* 138* 135* 125*  BUN 16   < > 15 18  --  23   < > 36* 38* 54* 56*  CREATININE 1.24   < > 1.16 1.49*  --  2.07*   < > 1.79* 1.65* 1.84* 1.80*  CALCIUM 9.2   < > 9.0 8.4*  --  8.1*   < > 8.4* 8.6* 8.8* 8.5*  MG  --   --  2.6* 2.6*  --  2.1  --   --   --   --   --   PROT 5.9*  --   --   --   --  5.8*  --   --   --   --   --   ALBUMIN 3.5  --   --   --   --  3.6  --  3.1*  --  3.3*  --   AST 28  --   --   --   --  45*  --   --   --   --   --   ALT 20  --   --   --   --  19  --   --   --   --   --   ALKPHOS 31*  --   --   --   --  24*  --   --   --   --   --   BILITOT 0.6  --   --   --   --  0.9  --   --   --   --   --   GFRNONAA >60   < > >60 51*  --  35*   < > 41* 46* 40*  41*  ANIONGAP 8   < > 12 9  --  11   < > '12 11 14 13   '$ < > = values in this interval not displayed.    Lipids  Recent Labs  Lab 07/29/22 0311  CHOL 122  TRIG 131  HDL 31*  LDLCALC 65  CHOLHDL 3.9    Hematology Recent Labs  Lab 08/01/22 0415 08/01/22 0423 08/02/22 0339 08/03/22 0437  WBC 11.4*  --  9.7 12.1*  RBC 2.83*  --  2.87* 3.11*  HGB 9.0* 9.2* 9.1* 9.5*  HCT 25.8* 27.0* 26.4* 29.1*  MCV 91.2  --  92.0 93.6  MCH 31.8  --  31.7 30.5  MCHC 34.9  --  34.5 32.6  RDW 12.8  --  12.9 12.7  PLT 144*  --  145* 254   Thyroid  Recent Labs  Lab 07/29/22 1553  TSH 0.392    BNPNo results for input(s): "BNP", "PROBNP" in the last 168 hours.  DDimer No results for input(s): "DDIMER" in the last 168 hours.   Radiology    DG CHEST PORT 1 VIEW  Result Date: 08/02/2022  CLINICAL DATA:  Prior CABG EXAM: PORTABLE CHEST 1 VIEW COMPARISON:  Yesterday FINDINGS: Right-sided PICC line has been placed in the interval. Difficult to follow centrally, but extends at least to the mid right atrium. Patient rotated to the left. Prior median sternotomy for CABG. Removal of right IJ Cordis sheath and left chest tube/mediastinal drain. Midline trachea. Moderate cardiomegaly. No pleural effusion or pneumothorax. Worsened aeration, with perihilar interstitial and airspace disease bilaterally. IMPRESSION: 1. Worsened aeration, progressive interstitial and alveolar edema. 2. Removal of left chest tube and mediastinal drain, without pneumothorax. 3. Right PICC line difficult to follow centrally, extends at least to the mid right atrium. Electronically Signed   By: Abigail Miyamoto M.D.   On: 08/02/2022 08:33    Cardiac Studies   Cath 07/28/22   Prox RCA to Mid RCA lesion is 65% stenosed.   Mid RCA lesion is 90% stenosed.  Irregular, eccentric calcified   Dist RCA lesion is 95% stenosed. ->  Almost channel due to eccentric location   Mid LAD-1 lesion is 90% stenosed with 30% stenosed side branch in 1st  Diag.  Heavily calcified   Mid LAD-2 lesion is 80% stenosed.  Heavily calcified   Dist LAD lesion is 90% stenosed.  Heavily calcified   1st Diag lesion is 70% stenosed.  TTE 10/23  1. Left ventricular ejection fraction, by estimation, is 60 to 65%. The  left ventricle has normal function. The left ventricle has no regional  wall motion abnormalities. Left ventricular diastolic parameters are  consistent with Grade I diastolic  dysfunction (impaired relaxation).   2. Right ventricular systolic function is normal. The right ventricular  size is normal.   3. The mitral valve is normal in structure. Trivial mitral valve  regurgitation. No evidence of mitral stenosis.   4. The aortic valve is tricuspid. Aortic valve regurgitation is not  visualized. Aortic valve sclerosis is present, with no evidence of aortic  valve stenosis.   5. Aortic dilatation noted. There is mild dilatation of the aortic root,  measuring 40 mm. There is borderline dilatation of the ascending aorta,  measuring 39 mm.   6. The inferior vena cava is normal in size with greater than 50%  respiratory variability, suggesting right atrial pressure of 3 mmHg.   Comparison(s): No significant change from prior study.   Patient Profile     66 y.o. male HTN, tobacco abuse now s/p CABG with LIMA to LAD, VG to Dx, and VG to PDA after NSTEMI/ACS  Assessment & Plan     ACS/NSTEMI:  Now s/p MV CABG.  Continue Crestor,  ASA, metoprolol 12.'5mg'$  BID.  Consider plavix when able. AF:  Continue metoprolol 12.'5mg'$  BID for now.   HTN:  BP stable HL:  Continue crestor 40     For questions or updates, please contact [place] Please consult [email or web address] for contact info under        Signed, Early Osmond, MD  08/03/2022, 7:42 AM

## 2022-08-03 NOTE — Progress Notes (Signed)
Amiodarone dose of 30 mg/hr, not 60 mg/hr. IV pump association problem. Pump reassociated.

## 2022-08-04 ENCOUNTER — Inpatient Hospital Stay (HOSPITAL_COMMUNITY): Payer: Medicare HMO

## 2022-08-04 DIAGNOSIS — I214 Non-ST elevation (NSTEMI) myocardial infarction: Secondary | ICD-10-CM | POA: Diagnosis not present

## 2022-08-04 LAB — BASIC METABOLIC PANEL
Anion gap: 10 (ref 5–15)
BUN: 60 mg/dL — ABNORMAL HIGH (ref 8–23)
CO2: 27 mmol/L (ref 22–32)
Calcium: 8.5 mg/dL — ABNORMAL LOW (ref 8.9–10.3)
Chloride: 100 mmol/L (ref 98–111)
Creatinine, Ser: 1.65 mg/dL — ABNORMAL HIGH (ref 0.61–1.24)
GFR, Estimated: 46 mL/min — ABNORMAL LOW (ref >60)
Glucose, Bld: 107 mg/dL — ABNORMAL HIGH (ref 70–99)
Potassium: 3.5 mmol/L (ref 3.5–5.1)
Sodium: 137 mmol/L (ref 135–145)

## 2022-08-04 LAB — CBC
HCT: 28.3 % — ABNORMAL LOW (ref 39.0–52.0)
Hemoglobin: 9.5 g/dL — ABNORMAL LOW (ref 13.0–17.0)
MCH: 31 pg (ref 26.0–34.0)
MCHC: 33.6 g/dL (ref 30.0–36.0)
MCV: 92.5 fL (ref 80.0–100.0)
Platelets: 238 10*3/uL (ref 150–400)
RBC: 3.06 MIL/uL — ABNORMAL LOW (ref 4.22–5.81)
RDW: 12.7 % (ref 11.5–15.5)
WBC: 8 10*3/uL (ref 4.0–10.5)
nRBC: 0 % (ref 0.0–0.2)

## 2022-08-04 LAB — GLUCOSE, CAPILLARY
Glucose-Capillary: 102 mg/dL — ABNORMAL HIGH (ref 70–99)
Glucose-Capillary: 104 mg/dL — ABNORMAL HIGH (ref 70–99)
Glucose-Capillary: 108 mg/dL — ABNORMAL HIGH (ref 70–99)
Glucose-Capillary: 99 mg/dL (ref 70–99)

## 2022-08-04 MED ORDER — AMIODARONE HCL 200 MG PO TABS
200.0000 mg | ORAL_TABLET | Freq: Two times a day (BID) | ORAL | Status: DC
  Administered 2022-08-04 – 2022-08-06 (×5): 200 mg via ORAL
  Filled 2022-08-04 (×5): qty 1

## 2022-08-04 MED ORDER — METOCLOPRAMIDE HCL 5 MG/ML IJ SOLN
10.0000 mg | Freq: Two times a day (BID) | INTRAMUSCULAR | Status: DC
  Administered 2022-08-04: 10 mg via INTRAVENOUS
  Filled 2022-08-04: qty 2

## 2022-08-04 MED ORDER — EZETIMIBE 10 MG PO TABS
10.0000 mg | ORAL_TABLET | Freq: Every day | ORAL | Status: DC
  Administered 2022-08-04 – 2022-08-06 (×3): 10 mg via ORAL
  Filled 2022-08-04 (×3): qty 1

## 2022-08-04 MED ORDER — POTASSIUM CHLORIDE CRYS ER 20 MEQ PO TBCR
20.0000 meq | EXTENDED_RELEASE_TABLET | ORAL | Status: AC
  Administered 2022-08-04 (×3): 20 meq via ORAL
  Filled 2022-08-04 (×3): qty 1

## 2022-08-04 MED ORDER — MAGIC MOUTHWASH
10.0000 mL | Freq: Four times a day (QID) | ORAL | Status: DC | PRN
  Administered 2022-08-04: 10 mL via ORAL
  Filled 2022-08-04 (×2): qty 10

## 2022-08-04 MED ORDER — SORBITOL 70 % SOLN: 30.0000 mL | Freq: Every evening | Status: DC | PRN

## 2022-08-04 NOTE — Progress Notes (Signed)
[facility] Progress Note: S/p CAB  In sinus rhythm Smiling in bed Waiting for a 4E bed.     Latest Ref Rng & Units 08/04/2022    4:51 AM 08/03/2022    4:37 AM 08/02/2022    3:39 AM  CBC  WBC 4.0 - 10.5 K/uL 8.0  12.1  9.7   Hemoglobin 13.0 - 17.0 g/dL 9.5  9.5  9.1   Hematocrit 39.0 - 52.0 % 28.3  29.1  26.4   Platelets 150 - 400 K/uL 238  254  145        Latest Ref Rng & Units 08/04/2022    4:51 AM 08/03/2022    4:08 PM 08/03/2022    4:37 AM  CMP  Glucose 70 - 99 mg/dL 107  196  125   BUN 8 - 23 mg/dL 60  60  56   Creatinine 0.61 - 1.24 mg/dL 1.65  1.76  1.80   Sodium 135 - 145 mmol/L 137  136  135   Potassium 3.5 - 5.1 mmol/L 3.5  3.8  3.8   Chloride 98 - 111 mmol/L 100  95  97   CO2 22 - 32 mmol/L '27  27  25   '$ Calcium 8.9 - 10.3 mg/dL 8.5  8.7  8.5     ABG    Component Value Date/Time   PHART 7.352 08/01/2022 0423   PCO2ART 41.9 08/01/2022 0423   PO2ART 86 08/01/2022 0423   HCO3 23.1 08/01/2022 0423   TCO2 24 08/01/2022 0423   ACIDBASEDEF 2.0 08/01/2022 0423   O2SAT 96 08/01/2022 0423

## 2022-08-04 NOTE — Progress Notes (Addendum)
[facility] DAILY ICU PROGRESS NOTE                   [address]            [place] [address]          [phone]   5 Days Post-Op Procedure(s) (LRB): CORONARY ARTERY BYPASS GRAFTING (CABG) ([name]) TRANSESOPHAGEAL ECHOCARDIOGRAM (TEE) ([name])  Total Length of Stay:  LOS: 6 days   Subjective: Conts to feel better  Objective: Vital signs in last 24 hours: Temp:  [97.8 F (36.6 C)-98.5 F (36.9 C)] 98.1 F (36.7 C) (11/14 0400) Pulse Rate:  [70-107] 70 (11/14 0600) Cardiac Rhythm: Normal sinus rhythm (11/14 0400) Resp:  [12-28] 14 (11/14 0600) BP: (92-134)/(56-110) 121/65 (11/14 0600) SpO2:  [87 %-100 %] 98 % (11/14 0600) Weight:  [85.9 kg] 85.9 kg (11/14 0500)  Filed Weights   08/02/22 0500 08/03/22 0500 08/04/22 0500  Weight: 89.8 kg 87 kg 85.9 kg    Weight change: -1.1 kg   Hemodynamic parameters for last 24 hours:    Intake/Output from previous day: 11/13 0701 - 11/14 0700 In: 1385.7 [P.O.:690; I.V.:395.5; IV Piggyback:300.2] Out: 2440 [Urine:2440]  Intake/Output this shift: No intake/output data recorded.  Current Meds: Scheduled Meds:  acetaminophen  1,000 mg Oral Q6H   Or   acetaminophen (TYLENOL) oral liquid 160 mg/5 mL  1,000 mg Per Tube Q6H   aspirin EC  325 mg Oral Daily   Or   aspirin  324 mg Per Tube Daily   bisacodyl  10 mg Oral Daily   Or   bisacodyl  10 mg Rectal Daily   Chlorhexidine Gluconate Cloth  6 each Topical Daily   cyclobenzaprine  7.5 mg Oral QHS   docusate sodium  200 mg Oral Daily   enoxaparin (LOVENOX) injection  30 mg Subcutaneous Q24H   fenofibrate  160 mg Oral Daily   furosemide  40 mg Oral Daily   guaiFENesin  600 mg Oral BID   insulin aspart  0-24 Units Subcutaneous TID WC & HS   insulin glargine-yfgn  10 Units Subcutaneous QHS   ipratropium-albuterol  3 mL Nebulization Q6H   levothyroxine  125 mcg Oral Q0600   lidocaine  2 patch Transdermal Q24H   metoCLOPramide (REGLAN) injection  10 mg Intravenous Q8H    metoprolol tartrate  12.5 mg Oral BID   Or   metoprolol tartrate  12.5 mg Per Tube BID   pantoprazole  40 mg Oral Daily   potassium chloride  20 mEq Oral Q4H   predniSONE  10 mg Oral QAC breakfast   rosuvastatin  40 mg Oral q morning   sodium chloride flush  10-40 mL Intracatheter Q12H   sodium chloride flush  3 mL Intravenous Q12H   thiamine  100 mg Intravenous Daily   Continuous Infusions:  sodium chloride     amiodarone Stopped (08/03/22 1500)   amiodarone 30 mg/hr (08/04/22 0700)   ceFEPime (MAXIPIME) IV Stopped (08/04/22 0529)   PRN Meds:.fentaNYL (SUBLIMAZE) injection, metoprolol tartrate, ondansetron (ZOFRAN) IV, mouth rinse, oxyCODONE, sodium chloride flush, traMADol  General appearance: alert, cooperative, and no distress Heart: regular rate and rhythm Lungs: clear to auscultation bilaterally Abdomen: benign Extremities: no edema Wound: incis healing well  Lab Results: CBC: Recent Labs    08/03/22 0437 08/04/22 0451  WBC 12.1* 8.0  HGB 9.5* 9.5*  HCT 29.1* 28.3*  PLT 254 238   BMET:  Recent Labs    08/03/22 1608 08/04/22  0451  NA 136 137  K 3.8 3.5  CL 95* 100  CO2 27 27  GLUCOSE 196* 107*  BUN 60* 60*  CREATININE 1.76* 1.65*  CALCIUM 8.7* 8.5*    CMET: Lab Results  Component Value Date   WBC 8.0 08/04/2022   HGB 9.5 (L) 08/04/2022   HCT 28.3 (L) 08/04/2022   PLT 238 08/04/2022   GLUCOSE 107 (H) 08/04/2022   CHOL 122 07/29/2022   TRIG 131 07/29/2022   HDL 31 (L) 07/29/2022   LDLCALC 65 07/29/2022   ALT 19 07/31/2022   AST 45 (H) 07/31/2022   NA 137 08/04/2022   K 3.5 08/04/2022   CL 100 08/04/2022   CREATININE 1.65 (H) 08/04/2022   BUN 60 (H) 08/04/2022   CO2 27 08/04/2022   TSH 0.392 07/29/2022   INR 1.2 07/30/2022   HGBA1C 5.3 07/28/2022     PT/INR: No results for input(s): "LABPROT", "INR" in the last 72 hours. Radiology: No results found.   Assessment/Plan: S/P Procedure(s) (LRB): CORONARY ARTERY BYPASS GRAFTING (CABG)  ([name]) TRANSESOPHAGEAL ECHOCARDIOGRAM (TEE) ([name]) POD#5  1 afebrile, SBP 92-134, drips-amiodarone in sinus currently 2 oxygen saturations good on 3 L [place] 3 excellent urine output, weight continues to trend lower-appears to be at below baseline currently currently on 40 p.o. Lasix twice daily 4 blood sugars well controlled 5 creatinine trending lower, 1.65 today.  BUN is stable at 60-getting potassium supplementation 6 leukocytosis resolved 7H/H stable 8 CXR-improving aeration   Carl [name] 08/04/2022 7:40 AM  O2 down to 2 L  Nsr on low dose amiodarone- will transition to oral dose Transfer to 4E- remove EPWs tomorrow and then start oral plavix patient examined and medical record reviewed,agree with above note. [name] 08/04/2022

## 2022-08-04 NOTE — Progress Notes (Signed)
Rounding Note    Patient Name: Carl [name] Date of Encounter: 08/04/2022  [place] Cardiologist: Larae Grooms, MD   Subjective   Feeling better.  Awaiting transfer to 32s.  No chest pain.  Inpatient Medications    Scheduled Meds:  acetaminophen  1,000 mg Oral Q6H   Or   acetaminophen (TYLENOL) oral liquid 160 mg/5 mL  1,000 mg Per Tube Q6H   aspirin EC  325 mg Oral Daily   Or   aspirin  324 mg Per Tube Daily   bisacodyl  10 mg Oral Daily   Or   bisacodyl  10 mg Rectal Daily   Chlorhexidine Gluconate Cloth  6 each Topical Daily   cyclobenzaprine  7.5 mg Oral QHS   docusate sodium  200 mg Oral Daily   enoxaparin (LOVENOX) injection  30 mg Subcutaneous Q24H   fenofibrate  160 mg Oral Daily   furosemide  40 mg Oral Daily   guaiFENesin  600 mg Oral BID   insulin aspart  0-24 Units Subcutaneous TID WC & HS   insulin glargine-yfgn  10 Units Subcutaneous QHS   ipratropium-albuterol  3 mL Nebulization Q6H   levothyroxine  125 mcg Oral Q0600   lidocaine  2 patch Transdermal Q24H   metoCLOPramide (REGLAN) injection  10 mg Intravenous Q8H   metoprolol tartrate  12.5 mg Oral BID   Or   metoprolol tartrate  12.5 mg Per Tube BID   pantoprazole  40 mg Oral Daily   potassium chloride  20 mEq Oral Q4H   predniSONE  10 mg Oral QAC breakfast   rosuvastatin  40 mg Oral q morning   sodium chloride flush  10-40 mL Intracatheter Q12H   sodium chloride flush  3 mL Intravenous Q12H   thiamine  100 mg Intravenous Daily   Continuous Infusions:  sodium chloride     amiodarone 30 mg/hr (08/04/22 0700)   ceFEPime (MAXIPIME) IV Stopped (08/04/22 0529)   PRN Meds: fentaNYL (SUBLIMAZE) injection, magic mouthwash, metoprolol tartrate, ondansetron (ZOFRAN) IV, mouth rinse, oxyCODONE, sodium chloride flush, traMADol   Vital Signs    Vitals:   08/04/22 0400 08/04/22 0500 08/04/22 0600 08/04/22 0817  BP: 131/64 134/64 121/65   Pulse: 76 73 70   Resp: '20 16 14   '$ Temp:  98.1 F (36.7 C)     TempSrc: Axillary     SpO2: 100% 100% 98% 100%  Weight:  85.9 kg    Height:        Intake/Output Summary (Last 24 hours) at 08/04/2022 0828 Last data filed at 08/04/2022 0700 Gross per 24 hour  Intake 1285.69 ml  Output 2190 ml  Net -904.31 ml      08/04/2022    5:00 AM 08/03/2022    5:00 AM 08/02/2022    5:00 AM  Last 3 Weights  Weight (lbs) 189 lb 6 oz 191 lb 12.8 oz 197 lb 15.6 oz  Weight (kg) 85.9 kg 87 kg 89.8 kg      Telemetry    Sinus rhythm with no significant arrhythmia- Personally Reviewed   Physical Exam  Alert, oriented, NAD GEN: No acute distress.   Neck: No JVD Cardiac: RRR, no murmurs, rubs, or gallops.  Respiratory: Clear to auscultation bilaterally. GI: Soft, nontender, non-distended  MS: No edema; No deformity. Neuro:  Nonfocal  Psych: Normal affect   Labs    High Sensitivity Troponin:   Recent Labs  Lab 07/28/22 1303 07/28/22 1800  TROPONINIHS 3,095* 3,052*  Chemistry Recent Labs  Lab 07/29/22 1553 07/30/22 0253 07/30/22 2018 07/31/22 0401 07/31/22 0437 07/31/22 1630 07/31/22 1722 08/01/22 1735 08/02/22 0339 08/02/22 1650 08/03/22 0437 08/03/22 1608 08/04/22 0451  NA 139   < > 137 130*   < > 134*   < > 136   < > 134* 135 136 137  K 4.0   < > 4.3 4.3   < > 3.9   < > 3.9   < > 4.2 3.8 3.8 3.5  CL 109   < > 107 103  --  103   < > 99   < > 97* 97* 95* 100  CO2 22   < > 18* 18*  --  20*   < > 25   < > '23 25 27 27  '$ GLUCOSE 100*   < > 138* 131*  --  139*   < > 136*   < > 135* 125* 196* 107*  BUN 16   < > 15 18  --  23   < > 36*   < > 54* 56* 60* 60*  CREATININE 1.24   < > 1.16 1.49*  --  2.07*   < > 1.79*   < > 1.84* 1.80* 1.76* 1.65*  CALCIUM 9.2   < > 9.0 8.4*  --  8.1*   < > 8.4*   < > 8.8* 8.5* 8.7* 8.5*  MG  --   --  2.6* 2.6*  --  2.1  --   --   --   --   --   --   --   PROT 5.9*  --   --   --   --  5.8*  --   --   --   --   --   --   --   ALBUMIN 3.5  --   --   --   --  3.6  --  3.1*  --  3.3*  --   2.9*  --   AST 28  --   --   --   --  45*  --   --   --   --   --   --   --   ALT 20  --   --   --   --  19  --   --   --   --   --   --   --   ALKPHOS 31*  --   --   --   --  24*  --   --   --   --   --   --   --   BILITOT 0.6  --   --   --   --  0.9  --   --   --   --   --   --   --   GFRNONAA >60   < > >60 51*  --  35*   < > 41*   < > 40* 41* 42* 46*  ANIONGAP 8   < > 12 9  --  11   < > 12   < > '14 13 14 10   '$ < > = values in this interval not displayed.    Lipids  Recent Labs  Lab 07/29/22 0311  CHOL 122  TRIG 131  HDL 31*  LDLCALC 65  CHOLHDL 3.9    Hematology Recent Labs  Lab 08/02/22 0339 08/03/22 0437 08/04/22 0451  WBC 9.7 12.1*  8.0  RBC 2.87* 3.11* 3.06*  HGB 9.1* 9.5* 9.5*  HCT 26.4* 29.1* 28.3*  MCV 92.0 93.6 92.5  MCH 31.7 30.5 31.0  MCHC 34.5 32.6 33.6  RDW 12.9 12.7 12.7  PLT 145* 254 238   Thyroid  Recent Labs  Lab 07/29/22 1553  TSH 0.392    BNPNo results for input(s): "BNP", "PROBNP" in the last 168 hours.  DDimer No results for input(s): "DDIMER" in the last 168 hours.   Radiology    DG Chest Port 1 View  Result Date: 08/04/2022 CLINICAL DATA:  Recent coronary bypass surgery, shortness of breath EXAM: PORTABLE CHEST 1 VIEW COMPARISON:  Previous studies including the examination of 08/03/2022 FINDINGS: Transverse diameter of heart is increased. There is coronary bypass surgery. There is a decrease in interstitial and alveolar densities in both parahilar regions. There are no new focal infiltrates. There is no pleural effusion or pneumothorax. IMPRESSION: There is partial clearing of interstitial and alveolar densities in both lungs suggesting resolving pulmonary edema or resolving pneumonia. There are no new focal infiltrates. Electronically Signed   By: Elmer Picker M.D.   On: 08/04/2022 08:20   DG CHEST PORT 1 VIEW  Result Date: 08/03/2022 CLINICAL DATA:  Shortness of breath, status post coronary bypass surgery EXAM: PORTABLE CHEST 1  VIEW COMPARISON:  Previous studies including the examination of 08/02/2022 FINDINGS: Transverse diameter of heart is increased. There is evidence of coronary bypass surgery. There is interval decrease in increased interstitial and alveolar markings in both lungs, especially in left lower lung field. There are no new infiltrates. There is no pneumothorax. Lateral CP angles are clear. Tip of right PICC line is seen in the region of superior vena cava. IMPRESSION: There is partial clearing of diffuse interstitial and alveolar densities in both lungs suggesting resolving pulmonary edema and possibly resolving pneumonia. There are no new focal infiltrates. Electronically Signed   By: Elmer Picker M.D.   On: 08/03/2022 08:10     Patient Profile     66 y.o. male with background of HTN and tobacco use presents with NSTEMI, found to have multivessel CAD, treated with multivessel CABG  Assessment & Plan    1.  Non-STEMI: Patient postoperative day #5 from CABG.  Progressing well.  Currently treated with aspirin, metoprolol, rosuvastatin.  Start clopidogrel when able from postop perspective. 2.  Postoperative atrial fibrillation: Maintaining sinus rhythm on amiodarone 200 mg twice daily. 3.  Hypertension: Blood pressure controlled 4.  Mixed hyperlipidemia: Treated with a high intensity statin drug (rosuvastatin 40 mg).  Also on Zetia and fenofibrate.  Dispo: Awaiting transfer to 4 E., likely discharge home later this week.      For questions or updates, please contact [place] Please consult [email or web address] for contact info under        Signed, Sherren Mocha, MD  08/04/2022, 8:28 AM

## 2022-08-05 ENCOUNTER — Inpatient Hospital Stay (HOSPITAL_COMMUNITY): Payer: Medicare HMO

## 2022-08-05 DIAGNOSIS — I200 Unstable angina: Secondary | ICD-10-CM | POA: Diagnosis not present

## 2022-08-05 LAB — CBC
HCT: 30.9 % — ABNORMAL LOW (ref 39.0–52.0)
Hemoglobin: 10 g/dL — ABNORMAL LOW (ref 13.0–17.0)
MCH: 30.5 pg (ref 26.0–34.0)
MCHC: 32.4 g/dL (ref 30.0–36.0)
MCV: 94.2 fL (ref 80.0–100.0)
Platelets: 254 10*3/uL (ref 150–400)
RBC: 3.28 MIL/uL — ABNORMAL LOW (ref 4.22–5.81)
RDW: 12.5 % (ref 11.5–15.5)
WBC: 8 10*3/uL (ref 4.0–10.5)
nRBC: 0 % (ref 0.0–0.2)

## 2022-08-05 LAB — BASIC METABOLIC PANEL
Anion gap: 8 (ref 5–15)
BUN: 53 mg/dL — ABNORMAL HIGH (ref 8–23)
CO2: 28 mmol/L (ref 22–32)
Calcium: 9 mg/dL (ref 8.9–10.3)
Chloride: 101 mmol/L (ref 98–111)
Creatinine, Ser: 1.42 mg/dL — ABNORMAL HIGH (ref 0.61–1.24)
GFR, Estimated: 54 mL/min — ABNORMAL LOW (ref >60)
Glucose, Bld: 90 mg/dL (ref 70–99)
Potassium: 4.4 mmol/L (ref 3.5–5.1)
Sodium: 137 mmol/L (ref 135–145)

## 2022-08-05 LAB — GLUCOSE, CAPILLARY
Glucose-Capillary: 91 mg/dL (ref 70–99)
Glucose-Capillary: 103 mg/dL — ABNORMAL HIGH (ref 70–99)
Glucose-Capillary: 110 mg/dL — ABNORMAL HIGH (ref 70–99)
Glucose-Capillary: 107 mg/dL — ABNORMAL HIGH (ref 70–99)

## 2022-08-05 MED ORDER — IPRATROPIUM-ALBUTEROL 0.5-2.5 (3) MG/3ML IN SOLN: 3.0000 mL | RESPIRATORY_TRACT | Status: DC | PRN

## 2022-08-05 MED ORDER — ASPIRIN 81 MG PO TBEC
81.0000 mg | DELAYED_RELEASE_TABLET | Freq: Every day | ORAL | Status: DC
  Administered 2022-08-05 – 2022-08-06 (×2): 81 mg via ORAL
  Filled 2022-08-05 (×2): qty 1

## 2022-08-05 MED ORDER — CLOPIDOGREL BISULFATE 75 MG PO TABS
75.0000 mg | ORAL_TABLET | Freq: Every day | ORAL | Status: DC
  Administered 2022-08-05 – 2022-08-06 (×2): 75 mg via ORAL
  Filled 2022-08-05 (×3): qty 1

## 2022-08-05 NOTE — Progress Notes (Signed)
Patient arrived to 4E from 2 Heart. Vitals taken and stable. Patient placed on tele and CCMD notified. Patient oriented to unit and staff. CHG completed on 2 Heart. Midsternal incision clean, dry and intact. Some bruising on right leg from harvest site.Call bell within reach.  [place] C Ayansh Feutz

## 2022-08-05 NOTE — Progress Notes (Signed)
Rounding Note    Patient Name: Carl [name] Date of Encounter: 08/05/2022  [place] Cardiologist: Larae Grooms, MD   Subjective   POD 6 after MV CABG  No acute events overnight.  Feels well   Inpatient Medications    Scheduled Meds:  amiodarone  200 mg Oral BID   aspirin EC  81 mg Oral Daily   bisacodyl  10 mg Oral Daily   Or   bisacodyl  10 mg Rectal Daily   Chlorhexidine Gluconate Cloth  6 each Topical Daily   clopidogrel  75 mg Oral Daily   cyclobenzaprine  7.5 mg Oral QHS   docusate sodium  200 mg Oral Daily   enoxaparin (LOVENOX) injection  30 mg Subcutaneous Q24H   ezetimibe  10 mg Oral Daily   fenofibrate  160 mg Oral Daily   guaiFENesin  600 mg Oral BID   insulin aspart  0-24 Units Subcutaneous TID WC & HS   ipratropium-albuterol  3 mL Nebulization Q6H   levothyroxine  125 mcg Oral Q0600   lidocaine  2 patch Transdermal Q24H   metoprolol tartrate  12.5 mg Oral BID   Or   metoprolol tartrate  12.5 mg Per Tube BID   pantoprazole  40 mg Oral Daily   predniSONE  10 mg Oral QAC breakfast   rosuvastatin  40 mg Oral q morning   sodium chloride flush  10-40 mL Intracatheter Q12H   sodium chloride flush  3 mL Intravenous Q12H   thiamine  100 mg Intravenous Daily   Continuous Infusions:  sodium chloride     ceFEPime (MAXIPIME) IV Stopped (08/05/22 0522)   PRN Meds: magic mouthwash, metoprolol tartrate, ondansetron (ZOFRAN) IV, mouth rinse, oxyCODONE, sodium chloride flush, sorbitol, traMADol   Vital Signs    Vitals:   08/05/22 0650 08/05/22 0700 08/05/22 0719 08/05/22 0854  BP:  (!) 134/97    Pulse:  94 90   Resp:  13 11   Temp: 98.2 F (36.8 C)  98.2 F (36.8 C)   TempSrc: Axillary  Axillary   SpO2:  96% 97% 96%  Weight:      Height:        Intake/Output Summary (Last 24 hours) at 08/05/2022 0901 Last data filed at 08/05/2022 0657 Gross per 24 hour  Intake 224.98 ml  Output 2900 ml  Net -2675.02 ml      08/05/2022     5:00 AM 08/04/2022    5:00 AM 08/03/2022    5:00 AM  Last 3 Weights  Weight (lbs) 183 lb 10.3 oz 189 lb 6 oz 191 lb 12.8 oz  Weight (kg) 83.3 kg 85.9 kg 87 kg      Telemetry    SR- Personally Reviewed  ECG    SB with TW changes- Personally Reviewed  Physical Exam   GEN: No acute distress.   Neck: No JVD Cardiac: RRR, no murmurs, rubs, or gallops.  Respiratory: Clear to auscultation bilaterally. GI: Soft, nontender, non-distended  MS: No edema; No deformity. Neuro:  Nonfocal  Psych: Normal affect   Labs    High Sensitivity Troponin:   Recent Labs  Lab 07/28/22 1303 07/28/22 1800  TROPONINIHS 3,095* 3,052*     Chemistry Recent Labs  Lab 07/29/22 1553 07/30/22 0253 07/30/22 2018 07/31/22 0401 07/31/22 0437 07/31/22 1630 07/31/22 1722 08/01/22 1735 08/02/22 0339 08/02/22 1650 08/03/22 0437 08/03/22 1608 08/04/22 0451 08/05/22 0435  NA 139   < > 137 130*   < >  134*   < > 136   < > 134*   < > 136 137 137  K 4.0   < > 4.3 4.3   < > 3.9   < > 3.9   < > 4.2   < > 3.8 3.5 4.4  CL 109   < > 107 103  --  103   < > 99   < > 97*   < > 95* 100 101  CO2 22   < > 18* 18*  --  20*   < > 25   < > 23   < > '27 27 28  '$ GLUCOSE 100*   < > 138* 131*  --  139*   < > 136*   < > 135*   < > 196* 107* 90  BUN 16   < > 15 18  --  23   < > 36*   < > 54*   < > 60* 60* 53*  CREATININE 1.24   < > 1.16 1.49*  --  2.07*   < > 1.79*   < > 1.84*   < > 1.76* 1.65* 1.42*  CALCIUM 9.2   < > 9.0 8.4*  --  8.1*   < > 8.4*   < > 8.8*   < > 8.7* 8.5* 9.0  MG  --   --  2.6* 2.6*  --  2.1  --   --   --   --   --   --   --   --   PROT 5.9*  --   --   --   --  5.8*  --   --   --   --   --   --   --   --   ALBUMIN 3.5  --   --   --   --  3.6  --  3.1*  --  3.3*  --  2.9*  --   --   AST 28  --   --   --   --  45*  --   --   --   --   --   --   --   --   ALT 20  --   --   --   --  19  --   --   --   --   --   --   --   --   ALKPHOS 31*  --   --   --   --  24*  --   --   --   --   --   --   --   --    BILITOT 0.6  --   --   --   --  0.9  --   --   --   --   --   --   --   --   GFRNONAA >60   < > >60 51*  --  35*   < > 41*   < > 40*   < > 42* 46* 54*  ANIONGAP 8   < > 12 9  --  11   < > 12   < > 14   < > '14 10 8   '$ < > = values in this interval not displayed.    Lipids  No results for input(s): "CHOL", "TRIG", "HDL", "LABVLDL", "LDLCALC", "CHOLHDL" in the last 168 hours.   Hematology Recent Labs  Lab  08/03/22 0437 08/04/22 0451 08/05/22 0435  WBC 12.1* 8.0 8.0  RBC 3.11* 3.06* 3.28*  HGB 9.5* 9.5* 10.0*  HCT 29.1* 28.3* 30.9*  MCV 93.6 92.5 94.2  MCH 30.5 31.0 30.5  MCHC 32.6 33.6 32.4  RDW 12.7 12.7 12.5  PLT 254 238 254   Thyroid  Recent Labs  Lab 07/29/22 1553  TSH 0.392    BNPNo results for input(s): "BNP", "PROBNP" in the last 168 hours.  DDimer No results for input(s): "DDIMER" in the last 168 hours.   Radiology    DG Chest Port 1 View  Result Date: 08/05/2022 CLINICAL DATA:  History of CABG EXAM: PORTABLE CHEST 1 VIEW COMPARISON:  Chest x-ray dated August 04, 2022 FINDINGS: Cardiac and mediastinal contours are unchanged status post median sternotomy and CABG. Unchanged position of right arm PICC. Similar mild bilateral interstitial opacities. No pleural effusion or pneumothorax. IMPRESSION: Unchanged mild pulmonary edema. Electronically Signed   By: Yetta Glassman M.D.   On: 08/05/2022 08:03   DG Chest Port 1 View  Result Date: 08/04/2022 CLINICAL DATA:  Recent coronary bypass surgery, shortness of breath EXAM: PORTABLE CHEST 1 VIEW COMPARISON:  Previous studies including the examination of 08/03/2022 FINDINGS: Transverse diameter of heart is increased. There is coronary bypass surgery. There is a decrease in interstitial and alveolar densities in both parahilar regions. There are no new focal infiltrates. There is no pleural effusion or pneumothorax. IMPRESSION: There is partial clearing of interstitial and alveolar densities in both lungs suggesting  resolving pulmonary edema or resolving pneumonia. There are no new focal infiltrates. Electronically Signed   By: Elmer Picker M.D.   On: 08/04/2022 08:20    Cardiac Studies   Cath 07/28/22   Prox RCA to Mid RCA lesion is 65% stenosed.   Mid RCA lesion is 90% stenosed.  Irregular, eccentric calcified   Dist RCA lesion is 95% stenosed. ->  Almost channel due to eccentric location   Mid LAD-1 lesion is 90% stenosed with 30% stenosed side branch in 1st Diag.  Heavily calcified   Mid LAD-2 lesion is 80% stenosed.  Heavily calcified   Dist LAD lesion is 90% stenosed.  Heavily calcified   1st Diag lesion is 70% stenosed.  TTE 10/23  1. Left ventricular ejection fraction, by estimation, is 60 to 65%. The  left ventricle has normal function. The left ventricle has no regional  wall motion abnormalities. Left ventricular diastolic parameters are  consistent with Grade I diastolic  dysfunction (impaired relaxation).   2. Right ventricular systolic function is normal. The right ventricular  size is normal.   3. The mitral valve is normal in structure. Trivial mitral valve  regurgitation. No evidence of mitral stenosis.   4. The aortic valve is tricuspid. Aortic valve regurgitation is not  visualized. Aortic valve sclerosis is present, with no evidence of aortic  valve stenosis.   5. Aortic dilatation noted. There is mild dilatation of the aortic root,  measuring 40 mm. There is borderline dilatation of the ascending aorta,  measuring 39 mm.   6. The inferior vena cava is normal in size with greater than 50%  respiratory variability, suggesting right atrial pressure of 3 mmHg.   Comparison(s): No significant change from prior study.   Patient Profile     66 y.o. male HTN, tobacco abuse now s/p CABG with LIMA to LAD, VG to Dx, and VG to PDA after NSTEMI/ACS  Assessment & Plan    ACS/NSTEMI:  Now s/p MV  CABG.  Continue Crestor,  ASA, plavix, metoprolol 12.'5mg'$  BID.  AF:  Continue  metoprolol 12.'5mg'$  BID for now.   HTN:  BP stable HL:  Continue crestor 40 Postop AF:  Looks to be NSR now, cont BB and amiodarone.     For questions or updates, please contact [place] Please consult [email or web address] for contact info under        Signed, Early Osmond, MD  08/05/2022, 9:01 AM   Patient ID: [name], male   DOB: 10/01/1955, 66 y.o.   MRN: [id number]

## 2022-08-05 NOTE — Progress Notes (Addendum)
[address]       [place] [address]             [phone]     6 Days Post-Op Procedure(s) (LRB): CORONARY ARTERY BYPASS GRAFTING (CABG) ([name]) TRANSESOPHAGEAL ECHOCARDIOGRAM (TEE) ([name]) Subjective: Continues to feel better, breathing is comfortable  Objective: Vital signs in last 24 hours: Temp:  [97.6 F (36.4 C)-98.2 F (36.8 C)] 98.2 F (36.8 C) (11/15 0719) Pulse Rate:  [59-95] 90 (11/15 0719) Cardiac Rhythm: Normal sinus rhythm (11/15 0400) Resp:  [11-23] 11 (11/15 0719) BP: (99-142)/(58-111) 134/97 (11/15 0700) SpO2:  [83 %-100 %] 97 % (11/15 0719) Weight:  [83.3 kg] 83.3 kg (11/15 0500)  Hemodynamic parameters for last 24 hours:    Intake/Output from previous day: 11/14 0701 - 11/15 0700 In: 241.7 [I.V.:41.6; IV Piggyback:200.1] Out: 2900 [Urine:2900] Intake/Output this shift: No intake/output data recorded.  General appearance: alert, cooperative, and no distress Heart: regular rate and rhythm Lungs: End expiratory wheeze Abdomen: Benign Extremities: No edema Wound: Incisions healing well, thigh ecchymosis is stable from [facility] harvest  Lab Results: Recent Labs    08/04/22 0451 08/05/22 0435  WBC 8.0 8.0  HGB 9.5* 10.0*  HCT 28.3* 30.9*  PLT 238 254   BMET:  Recent Labs    08/04/22 0451 08/05/22 0435  NA 137 137  K 3.5 4.4  CL 100 101  CO2 27 28  GLUCOSE 107* 90  BUN 60* 53*  CREATININE 1.65* 1.42*  CALCIUM 8.5* 9.0    PT/INR: No results for input(s): "LABPROT", "INR" in the last 72 hours. ABG    Component Value Date/Time   PHART 7.352 08/01/2022 0423   HCO3 23.1 08/01/2022 0423   TCO2 24 08/01/2022 0423   ACIDBASEDEF 2.0 08/01/2022 0423   O2SAT 96 08/01/2022 0423   CBG (last 3)  Recent Labs    08/04/22 1549 08/04/22 2147 08/05/22 0647  GLUCAP 108* 99 91    Meds Scheduled Meds:  amiodarone  200 mg Oral BID   aspirin EC  325 mg Oral Daily   Or   aspirin  324 mg Per Tube Daily   bisacodyl  10 mg  Oral Daily   Or   bisacodyl  10 mg Rectal Daily   Chlorhexidine Gluconate Cloth  6 each Topical Daily   cyclobenzaprine  7.5 mg Oral QHS   docusate sodium  200 mg Oral Daily   enoxaparin (LOVENOX) injection  30 mg Subcutaneous Q24H   ezetimibe  10 mg Oral Daily   fenofibrate  160 mg Oral Daily   guaiFENesin  600 mg Oral BID   insulin aspart  0-24 Units Subcutaneous TID WC & HS   ipratropium-albuterol  3 mL Nebulization Q6H   levothyroxine  125 mcg Oral Q0600   lidocaine  2 patch Transdermal Q24H   metoprolol tartrate  12.5 mg Oral BID   Or   metoprolol tartrate  12.5 mg Per Tube BID   pantoprazole  40 mg Oral Daily   predniSONE  10 mg Oral QAC breakfast   rosuvastatin  40 mg Oral q morning   sodium chloride flush  10-40 mL Intracatheter Q12H   sodium chloride flush  3 mL Intravenous Q12H   thiamine  100 mg Intravenous Daily   Continuous Infusions:  sodium chloride     ceFEPime (MAXIPIME) IV Stopped (08/05/22 0522)   PRN Meds:.magic mouthwash, metoprolol tartrate, ondansetron (ZOFRAN) IV, mouth rinse, oxyCODONE, sodium chloride flush, sorbitol, traMADol  Xrays  DG Chest Port 1 View  Result Date: 08/04/2022 CLINICAL DATA:  Recent coronary bypass surgery, shortness of breath EXAM: PORTABLE CHEST 1 VIEW COMPARISON:  Previous studies including the examination of 08/03/2022 FINDINGS: Transverse diameter of heart is increased. There is coronary bypass surgery. There is a decrease in interstitial and alveolar densities in both parahilar regions. There are no new focal infiltrates. There is no pleural effusion or pneumothorax. IMPRESSION: There is partial clearing of interstitial and alveolar densities in both lungs suggesting resolving pulmonary edema or resolving pneumonia. There are no new focal infiltrates. Electronically Signed   By: Elmer Picker M.D.   On: 08/04/2022 08:20    Assessment/Plan: S/P Procedure(s) (LRB): CORONARY ARTERY BYPASS GRAFTING (CABG)  ([name]) TRANSESOPHAGEAL ECHOCARDIOGRAM (TEE) ([name]) POD #6  1 afebrile, vital signs stable, SBP 100-140 range, sinus rhythm, on low-dose oral amiodarone 2 oxygen saturations good on 1 L [place] 3 excellent diuresis-weight below preop-diuretics have been stopped 4 blood sugars well controlled, not a diabetic 5 AKI-creatinine continues to improve, now 1.4 to with BUN 53. 6 expected acute blood loss anemia continues to trend improved with volume equilibration 7 leukocytosis is resolved 8 thrombocytopenia resolved 9 remove EP W's today and begin Plavix for non-STEMI 10 chest x-ray-aeration continues to improve 11 continue pulmonary hygiene and respiratory treatments, ambulation-possible home in a.m. if no new issues    LOS: 7 days  Gaspar Bidding Pager [phone] 08/05/2022   Patient now off oxygen waiting for room on 4 E. We will remove epicardial pacing wires today and start Plavix for presentation with non-STEMI. Probable discharge tomorrow if patient continues to do well with significant improvement in his pulmonary and renal status over the past 48 hours.  Remains in sinus rhythm. Today's chest x-ray image personally reviewed showing clear lung fields.  patient examined and medical record reviewed,agree with above note. [name] 08/05/2022

## 2022-08-06 DIAGNOSIS — I4891 Unspecified atrial fibrillation: Secondary | ICD-10-CM | POA: Insufficient documentation

## 2022-08-06 LAB — GLUCOSE, CAPILLARY: Glucose-Capillary: 93 mg/dL (ref 70–99)

## 2022-08-06 MED ORDER — METOPROLOL TARTRATE 25 MG PO TABS: 12.5000 mg | ORAL_TABLET | Freq: Two times a day (BID) | ORAL | 1 refills | Status: DC

## 2022-08-06 MED ORDER — PANTOPRAZOLE SODIUM 40 MG PO TBEC: 40.0000 mg | DELAYED_RELEASE_TABLET | Freq: Every day | ORAL | 1 refills | Status: DC

## 2022-08-06 MED ORDER — OXYCODONE HCL 5 MG PO TABS: 5.0000 mg | ORAL_TABLET | Freq: Four times a day (QID) | ORAL | 0 refills | Status: DC | PRN

## 2022-08-06 MED ORDER — AMIODARONE HCL 200 MG PO TABS: 200.0000 mg | ORAL_TABLET | Freq: Two times a day (BID) | ORAL | 1 refills | Status: DC

## 2022-08-06 MED ORDER — EZETIMIBE 10 MG PO TABS: 10.0000 mg | ORAL_TABLET | Freq: Every day | ORAL | 1 refills | Status: DC

## 2022-08-06 MED ORDER — CLOPIDOGREL BISULFATE 75 MG PO TABS: 75.0000 mg | ORAL_TABLET | Freq: Every day | ORAL | 1 refills | Status: DC

## 2022-08-06 NOTE — Plan of Care (Signed)
Discharge instructions discussed with patient.  Patient instructed on home medications, restrictions, and follow up appointments. Belongings gathered and sent with patient.   Patient discharged via wheelchair by this writer.   

## 2022-08-06 NOTE — TOC Transition Note (Signed)
Transition of Care (TOC) - CM/SW Discharge Note Marvetta Gibbons RN, BSN Transitions of Care Unit 4E- RN Case Manager See Treatment Team for direct phone #  Patient Details  Name: Carl [name] MRN: [id number] Date of Birth: 20-Nov-1955  [facility] CM/SW Contact:  Dawayne Patricia, RN Phone Number: 08/06/2022, 12:22 PM   Clinical Narrative:    Pt stable for transition home today, DME- rollator has been declined by patient- no further DME needs noted.  Family to transport home.  No further TOC needs noted   Final next level of care: Home/Self Care Barriers to Discharge: No Barriers Identified   Patient Goals and CMS Choice Patient states their goals for this hospitalization and ongoing recovery are:: to return home   Choice offered to / list presented to : NA  Discharge Placement               Home        Discharge Plan and Services   Discharge Planning Services: CM Consult Post Acute Care Choice: Durable Medical Equipment          DME Arranged: Walker rolling with seat, Patient refused services DME Agency: NA       HH Arranged: NA HH Agency: NA        Social Determinants of Health (SDOH) Interventions     Readmission Risk Interventions     No data to display

## 2022-08-06 NOTE — Progress Notes (Addendum)
[address]Suite [address]       [place] [address]             [phone]     7 Days Post-Op Procedure(s) (LRB): CORONARY ARTERY BYPASS GRAFTING (CABG) ([name]) TRANSESOPHAGEAL ECHOCARDIOGRAM (TEE) ([name]) Subjective: Feels well  Objective: Vital signs in last 24 hours: Temp:  [97.8 F (36.6 C)-98.4 F (36.9 C)] 98.4 F (36.9 C) (11/16 0447) Pulse Rate:  [69-95] 74 (11/16 0447) Cardiac Rhythm: Normal sinus rhythm (11/15 1900) Resp:  [12-20] 20 (11/16 0447) BP: (106-131)/(63-87) 130/63 (11/16 0447) SpO2:  [90 %-100 %] 98 % (11/16 0447) Weight:  [82 kg] 82 kg (11/16 0449)  Hemodynamic parameters for last 24 hours:    Intake/Output from previous day: 11/15 0701 - 11/16 0700 In: 720 [P.O.:720] Out: 650 [Urine:650] Intake/Output this shift: No intake/output data recorded.  General appearance: alert, cooperative, and no distress Heart: regular rate and rhythm Lungs: minor coarseness Abdomen: benign Extremities: no edema Wound: incis healing well  Lab Results: Recent Labs    08/04/22 0451 08/05/22 0435  WBC 8.0 8.0  HGB 9.5* 10.0*  HCT 28.3* 30.9*  PLT 238 254   BMET:  Recent Labs    08/04/22 0451 08/05/22 0435  NA 137 137  K 3.5 4.4  CL 100 101  CO2 27 28  GLUCOSE 107* 90  BUN 60* 53*  CREATININE 1.65* 1.42*  CALCIUM 8.5* 9.0    PT/INR: No results for input(s): "LABPROT", "INR" in the last 72 hours. ABG    Component Value Date/Time   PHART 7.352 08/01/2022 0423   HCO3 23.1 08/01/2022 0423   TCO2 24 08/01/2022 0423   ACIDBASEDEF 2.0 08/01/2022 0423   O2SAT 96 08/01/2022 0423   CBG (last 3)  Recent Labs    08/05/22 1609 08/05/22 2109 08/06/22 0619  GLUCAP 110* 107* 93    Meds Scheduled Meds:  amiodarone  200 mg Oral BID   aspirin EC  81 mg Oral Daily   bisacodyl  10 mg Oral Daily   Or   bisacodyl  10 mg Rectal Daily   Chlorhexidine Gluconate Cloth  6 each Topical Daily   clopidogrel  75 mg Oral Daily   cyclobenzaprine  7.5  mg Oral QHS   docusate sodium  200 mg Oral Daily   ezetimibe  10 mg Oral Daily   fenofibrate  160 mg Oral Daily   guaiFENesin  600 mg Oral BID   insulin aspart  0-24 Units Subcutaneous TID WC & HS   levothyroxine  125 mcg Oral Q0600   lidocaine  2 patch Transdermal Q24H   metoprolol tartrate  12.5 mg Oral BID   Or   metoprolol tartrate  12.5 mg Per Tube BID   pantoprazole  40 mg Oral Daily   rosuvastatin  40 mg Oral q morning   sodium chloride flush  10-40 mL Intracatheter Q12H   sodium chloride flush  3 mL Intravenous Q12H   thiamine  100 mg Intravenous Daily   Continuous Infusions:  sodium chloride     PRN Meds:.ipratropium-albuterol, magic mouthwash, metoprolol tartrate, ondansetron (ZOFRAN) IV, mouth rinse, oxyCODONE, sodium chloride flush, sorbitol, traMADol  Xrays DG Chest Port 1 View  Result Date: 08/05/2022 CLINICAL DATA:  History of CABG EXAM: PORTABLE CHEST 1 VIEW COMPARISON:  Chest x-ray dated August 04, 2022 FINDINGS: Cardiac and mediastinal contours are unchanged status post median sternotomy and CABG. Unchanged position of right arm PICC. Similar mild bilateral interstitial opacities. No  pleural effusion or pneumothorax. IMPRESSION: Unchanged mild pulmonary edema. Electronically Signed   By: Yetta Glassman M.D.   On: 08/05/2022 08:03    Assessment/Plan: S/P Procedure(s) (LRB): CORONARY ARTERY BYPASS GRAFTING (CABG) ([name]) TRANSESOPHAGEAL ECHOCARDIOGRAM (TEE) ([name]) POD #7 1 afebrile, vital signs stable, sinus rhythm 2 oxygen saturations 98% on room air 3 excellent urine output, weight below preop, will discontinue diuretics 4 blood sugars well controlled 5 no new labs or x-rays, have been very stable 6 stable for discharge    LOS: 8 days    John Giovanni PA-C Pager [phone] 08/06/2022  Looks good - DC instructions reviewed with patient - he says he has quit smoking  patient examined and medical record reviewed,agree with above note. [name] 08/06/2022

## 2022-08-06 NOTE — Care Management Important Message (Signed)
Important Message  Patient Details  Name: Carl [name] MRN: [id number] Date of Birth: [birth year]   Medicare Important Message Given:  Yes     Shelda Altes 08/06/2022, 8:15 AM

## 2022-08-06 NOTE — Progress Notes (Signed)
CARDIAC REHAB PHASE I   Pt sitting in chair feeling well this morning. Reports ambulating independently several times per day without difficulty. Reviewed proper sternal precautions, pt verbalized understanding. Reports able to adhere to those precautions. Post [facility] education including site care, IS use at home, home needs at discharge, exercise guidelines, restrictions, move in the tub, sternal precautions, heart healthy diet, risk factors, smoking cessation and CRP2 reviewed. All questions and concerns addressed. Will refer to [facility] for CRP2. Plan for home today.   [phone]  Vanessa Barbara, RN BSN 08/06/2022 8:50 AM

## 2022-08-11 ENCOUNTER — Institutional Professional Consult (permissible substitution): Payer: Medicare HMO | Admitting: Cardiovascular Disease

## 2022-08-11 MED FILL — Calcium Chloride Inj 10%: INTRAVENOUS | Qty: 10 | Status: AC

## 2022-08-11 MED FILL — Sodium Chloride IV Soln 0.9%: INTRAVENOUS | Qty: 3000 | Status: AC

## 2022-08-11 MED FILL — Sodium Bicarbonate IV Soln 8.4%: INTRAVENOUS | Qty: 50 | Status: AC

## 2022-08-11 MED FILL — Lidocaine HCl Local Soln Prefilled Syringe 100 MG/5ML (2%): INTRAMUSCULAR | Qty: 5 | Status: AC

## 2022-08-11 MED FILL — Electrolyte-R (PH 7.4) Solution: INTRAVENOUS | Qty: 3000 | Status: AC

## 2022-08-11 MED FILL — Heparin Sodium (Porcine) Inj 1000 Unit/ML: INTRAMUSCULAR | Qty: 30 | Status: AC

## 2022-08-11 MED FILL — Mannitol IV Soln 20%: INTRAVENOUS | Qty: 500 | Status: AC

## 2022-08-17 ENCOUNTER — Telehealth: Payer: Self-pay | Admitting: *Deleted

## 2022-08-17 NOTE — Telephone Encounter (Signed)
Returned call to patient from message left with answering service. Per patient, he received a phone call back and his questions were answered.

## 2022-08-19 ENCOUNTER — Ambulatory Visit: Payer: Medicare HMO | Admitting: Physician Assistant

## 2022-08-25 ENCOUNTER — Other Ambulatory Visit: Payer: Self-pay | Admitting: Cardiothoracic Surgery

## 2022-08-25 ENCOUNTER — Institutional Professional Consult (permissible substitution): Payer: Medicare HMO | Admitting: Cardiovascular Disease

## 2022-08-25 DIAGNOSIS — Z951 Presence of aortocoronary bypass graft: Secondary | ICD-10-CM

## 2022-08-26 ENCOUNTER — Telehealth (HOSPITAL_COMMUNITY): Payer: Self-pay

## 2022-08-26 NOTE — Telephone Encounter (Signed)
Called patient to see if he is interested in the Cardiac Rehab Program.Left message with pt wife Claiborne Billings about CR, she stated she will have pt give [place] a call back. Explained scheduling process.   Will contact patient for scheduling once f/u has been completed.

## 2022-08-27 ENCOUNTER — Other Ambulatory Visit: Payer: Self-pay | Admitting: Cardiothoracic Surgery

## 2022-08-27 DIAGNOSIS — Z951 Presence of aortocoronary bypass graft: Secondary | ICD-10-CM
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
## 2022-08-28 ENCOUNTER — Ambulatory Visit: Payer: Medicare HMO | Admitting: Cardiothoracic Surgery

## 2022-08-28 ENCOUNTER — Ambulatory Visit
Admission: RE | Admit: 2022-08-28 | Discharge: 2022-08-28 | Disposition: A | Discharge status: Home / Self Care | Payer: Medicare HMO | Source: Ambulatory Visit | Attending: Cardiothoracic Surgery | Admitting: Cardiothoracic Surgery

## 2022-08-28 ENCOUNTER — Ambulatory Visit: Payer: Medicare HMO | Attending: Physician Assistant | Admitting: Physician Assistant

## 2022-08-28 ENCOUNTER — Encounter: Payer: Self-pay | Admitting: Physician Assistant

## 2022-08-28 ENCOUNTER — Other Ambulatory Visit: Payer: Self-pay | Admitting: Surgical

## 2022-08-28 ENCOUNTER — Other Ambulatory Visit: Payer: Self-pay | Admitting: Physician Assistant

## 2022-08-28 ENCOUNTER — Ambulatory Visit (INDEPENDENT_AMBULATORY_CARE_PROVIDER_SITE_OTHER): Payer: Self-pay | Admitting: Cardiothoracic Surgery

## 2022-08-28 ENCOUNTER — Encounter: Payer: Self-pay | Admitting: Cardiothoracic Surgery

## 2022-08-28 VITALS — BP 124/62 | HR 56 | Ht 69.0 in | Wt 194.8 lb

## 2022-08-28 VITALS — BP 147/83 | HR 60 | Resp 20 | Ht 69.0 in | Wt 195.0 lb

## 2022-08-28 DIAGNOSIS — I2510 Atherosclerotic heart disease of native coronary artery without angina pectoris: Secondary | ICD-10-CM

## 2022-08-28 DIAGNOSIS — Z951 Presence of aortocoronary bypass graft: Secondary | ICD-10-CM

## 2022-08-28 DIAGNOSIS — Z09 Encounter for follow-up examination after completed treatment for conditions other than malignant neoplasm: Secondary | ICD-10-CM

## 2022-08-28 DIAGNOSIS — E785 Hyperlipidemia, unspecified: Secondary | ICD-10-CM

## 2022-08-28 DIAGNOSIS — I739 Peripheral vascular disease, unspecified: Secondary | ICD-10-CM

## 2022-08-28 DIAGNOSIS — I10 Essential (primary) hypertension: Secondary | ICD-10-CM

## 2022-08-28 DIAGNOSIS — E039 Hypothyroidism, unspecified: Secondary | ICD-10-CM

## 2022-08-28 DIAGNOSIS — I77819 Aortic ectasia, unspecified site: Secondary | ICD-10-CM | POA: Diagnosis not present

## 2022-08-28 DIAGNOSIS — Z720 Tobacco use: Secondary | ICD-10-CM

## 2022-08-28 MED ORDER — AMIODARONE HCL 200 MG PO TABS: 200.0000 mg | ORAL_TABLET | Freq: Every day | ORAL | 1 refills | Status: DC

## 2022-08-28 MED ORDER — OXYCODONE HCL 5 MG PO TABS: 5.0000 mg | ORAL_TABLET | Freq: Two times a day (BID) | ORAL | 0 refills | Status: DC | PRN

## 2022-08-28 NOTE — Patient Instructions (Addendum)
Medication Instructions:  1.Stop protonix 2.Decrease amiodarone to 200 mg daily *If you need a refill on your cardiac medications before your next appointment, please call your pharmacy*   Lab Work: None If you have labs (blood work) drawn today and your tests are completely normal, you will receive your results only by: [place] (if you have MyChart) OR A paper copy in the mail If you have any lab test that is abnormal or we need to change your treatment, we will call you to review the results.   Follow-Up: At [facility], you and your health needs are our priority.  As part of our continuing mission to provide you with exceptional heart care, we have created designated Provider Care Teams.  These Care Teams include your primary Cardiologist (physician) and Advanced Practice Providers (APPs -  Physician Assistants and Nurse Practitioners) who all work together to provide you with the care you need, when you need it.  We recommend signing up for the patient portal called "MyChart".  Sign up information is provided on this After Visit Summary.  MyChart is used to connect with patients for Virtual Visits (Telemedicine).  Patients are able to view lab/test results, encounter notes, upcoming appointments, etc.  Non-urgent messages can be sent to your provider as well.   To learn more about what you can do with MyChart, go to [email or web address].    Your next appointment:   3 month(s)  The format for your next appointment:   In Person  Provider:   Larae Grooms, MD    Important Information About Sugar

## 2022-08-28 NOTE — Progress Notes (Signed)
HPI: Patient doing well 1 month after urgent CABG x 3 for unstable angina.  No recurrent chest pain.  Incision is healing well.  He is walking daily.  He still is having some neuropathic sternal incision pain.  Chest x-ray performed today is personally reviewed showing clear lung fields no pleural effusion sternal wires intact.  He has been on amiodarone for perioperative atrial fibrillation and denies any symptoms of palpitations.  Patient has completely stopped smoking.  Current Outpatient Medications  Medication Sig Dispense Refill   amiodarone (PACERONE) 200 MG tablet Take 1 tablet (200 mg total) by mouth 2 (two) times daily. 60 tablet 1   aspirin 81 MG EC tablet Take 81 mg by mouth daily.     clopidogrel (PLAVIX) 75 MG tablet Take 1 tablet (75 mg total) by mouth daily. 30 tablet 1   ezetimibe (ZETIA) 10 MG tablet Take 1 tablet (10 mg total) by mouth daily. 30 tablet 1   fenofibrate (TRICOR) 145 MG tablet Take 145 mg by mouth daily.      levothyroxine (SYNTHROID) 125 MCG tablet Take 125 mcg by mouth daily.     metoprolol tartrate (LOPRESSOR) 25 MG tablet Take 0.5 tablets (12.5 mg total) by mouth 2 (two) times daily. 30 tablet 1   pantoprazole (PROTONIX) 40 MG tablet Take 1 tablet (40 mg total) by mouth daily. 30 tablet 1   rosuvastatin (CRESTOR) 40 MG tablet Take 40 mg by mouth every morning.      oxyCODONE (OXY IR/ROXICODONE) 5 MG immediate release tablet Take 1 tablet (5 mg total) by mouth 2 (two) times daily as needed for severe pain. 14 tablet 0   No current facility-administered medications for this visit.     Physical Exam: Blood pressure (!) 147/83, pulse 60, resp. rate 20, height '5\' 9"'$  (1.753 m), weight 195 lb (88.5 kg), SpO2 97 %.      Exam    General- alert and comfortable    Neck- no JVD, no cervical adenopathy palpable, no carotid bruit   Lungs- clear without rales, wheezes.  Sternal incision well-healed.   Cor- regular rate and rhythm, no murmur , gallop   Abdomen- soft,  non-tender   Extremities - warm, non-tender, minimal edema   Neuro- oriented, appropriate, no focal weakness   Diagnostic Tests: Chest x-ray performed first reviewed showing clear lung fields no pleural effusion.  Impression: Status post CABG x 3 for non-STEMI. Doing well without recurrent atrial fibrillation.  Patient will finish his current supply of amiodarone and continue his other medications.  I will call in an additional half prescription for oxycodone for his neuropathic incisional chest pain.  The patient may drive and lift up to 15 pounds but he is not ready to go back to his normal activities.  Plan: Patient will return for his final follow-up visit in 8 weeks for review of progress and to discuss lifting sternal precautions at that time.   [name], MD [facility] [phone]

## 2022-08-31 ENCOUNTER — Ambulatory Visit: Payer: Medicare HMO | Admitting: Cardiothoracic Surgery

## 2022-09-03 ENCOUNTER — Ambulatory Visit: Payer: Medicare HMO | Admitting: Podiatry

## 2022-09-03 DIAGNOSIS — M25572 Pain in left ankle and joints of left foot: Secondary | ICD-10-CM | POA: Diagnosis not present

## 2022-09-03 MED ORDER — METHYLPREDNISOLONE 4 MG PO TBPK: ORAL_TABLET | ORAL | 0 refills | Status: DC

## 2022-09-04 ENCOUNTER — Encounter: Payer: Self-pay | Admitting: Cardiovascular Disease

## 2022-09-04 ENCOUNTER — Ambulatory Visit: Payer: Medicare HMO | Attending: Cardiovascular Disease | Admitting: Cardiovascular Disease

## 2022-09-04 VITALS — BP 140/88 | HR 56 | Ht 69.0 in | Wt 194.6 lb

## 2022-09-04 DIAGNOSIS — I739 Peripheral vascular disease, unspecified: Secondary | ICD-10-CM

## 2022-09-04 MED ORDER — CILOSTAZOL 50 MG PO TABS: 50.0000 mg | ORAL_TABLET | Freq: Two times a day (BID) | ORAL | 2 refills | Status: DC

## 2022-09-04 NOTE — Assessment & Plan Note (Signed)
Carl [name] was referred to me by Nicholes Rough, PA-C for evaluation of PAD.  His cardiologist is Dr. Irish Lack.  He has a history of ischemic heart disease status post recent coronary artery bypass grafting times 311/9/23 for two-vessel disease and non-STEMI.  Other problems include treated hyperlipidemia and tobacco abuse recently discontinued.  He is complained of claudication which is symmetric to the left several years with recent Doppler studies performed 07/24/2022 revealing a right ABI of 0.82 and a left of 0.77.  He did have a high-frequency signal in his right common femoral and distal right SFA as well as a high-frequency signal in his left common femoral with occluded distal left SFA.  I am going to begin him on Pletal 50 mg p.o. twice daily I will see him back in 3 months.  If this does not afford him significant benefit we will talk about endovascular therapy.

## 2022-09-04 NOTE — Progress Notes (Signed)
09/04/2022 Carl [name]   07/26/1956  [id number]  Primary Physician Carl [name], Carl [name]: Carl Harp MD Carl [name], [place]  HPI:  Carl [name] is a 66 y.o. thin-appearing married Caucasian male father of 2 sons, grandfather of 2 grandchildren referred by Nicholes Rough, PA-C for evaluation of peripheral arterial disease.  His [name] is Dr. Irish Lack.  He is a retired [employer] manager.  Factors include 75-pack-year tobacco abuse having quit at the time of his bypass surgery last month as well as hyperlipidemia.  There is no family history.  He had a remote stroke 5 to 6 years ago with no residual neurologic deficits.  He had 3 back surgeries as well.  He had CABG times 311/9/23 with a free LIMA to the LAD, vein to diagonal branch and PDA.  He did have some postop A-fib.  He is complained of bilateral symmetric lifestyle-limiting claudication for the last several years with recent Doppler studies revealing ABIs in the 0.8 range with moderate common femoral disease bilaterally with high-grade distal right SFA disease and occluded distal left SFA.   Current Meds  Medication Sig   amiodarone (PACERONE) 200 MG tablet Take 1 tablet (200 mg total) by mouth daily.   aspirin 81 MG EC tablet Take 81 mg by mouth daily.   clopidogrel (PLAVIX) 75 MG tablet Take 1 tablet (75 mg total) by mouth daily.   ezetimibe (ZETIA) 10 MG tablet Take 1 tablet (10 mg total) by mouth daily.   fenofibrate (TRICOR) 145 MG tablet Take 145 mg by mouth daily.    HYDROcodone-acetaminophen (NORCO/VICODIN) 5-325 MG tablet Take 1 tablet by mouth every 6 (six) hours as needed for moderate pain or severe pain.   levothyroxine (SYNTHROID) 125 MCG tablet Take 125 mcg by mouth daily.   methylPREDNISolone (MEDROL DOSEPAK) 4 MG TBPK tablet 6 day dose pack - take as directed   metoprolol tartrate (LOPRESSOR) 25 MG tablet Take 0.5 tablets (12.5 mg total) by mouth 2 (two) times daily.   oxyCODONE (OXY  IR/ROXICODONE) 5 MG immediate release tablet Take 1 tablet (5 mg total) by mouth 2 (two) times daily as needed for severe pain.   pantoprazole (PROTONIX) 40 MG tablet Take 40 mg by mouth daily.   rosuvastatin (CRESTOR) 40 MG tablet Take 40 mg by mouth every morning.      No Known Allergies  Social History   Socioeconomic History   Marital status: Married    Spouse name: Not on file   Number of children: Not on file   Years of education: Not on file   Highest education level: Not on file  Occupational History   Not on file  Tobacco Use   Smoking status: Former    Packs/day: 1.00    Years: 45.00    Total pack years: 45.00    Types: Cigarettes    Quit date: 07/28/2022    Years since quitting: 0.1   Smokeless tobacco: Never  Vaping Use   Vaping Use: Never used  Substance and Sexual Activity   Alcohol use: Yes    Comment: OCCASIONAL BEER   Drug use: No   Sexual activity: Not on file  Other Topics Concern   Not on file  Social History Narrative   Not on file   Social Determinants of Health   Financial Resource Strain: Not on file  Food Insecurity: No Food Insecurity (08/03/2022)   Hunger Vital Sign    Worried About Running Out of Food  in the Last Year: Never true    [place] in the Last Year: Never true  Transportation Needs: No Transportation Needs (08/03/2022)   PRAPARE - Hydrologist (Medical): No    Lack of Transportation (Non-Medical): No  Physical Activity: Not on file  Stress: Not on file  Social Connections: Not on file  Intimate Partner Violence: Not At Risk (08/03/2022)   Humiliation, Afraid, Rape, and Kick questionnaire    Fear of Current or Ex-Partner: No    Emotionally Abused: No    Physically Abused: No    Sexually Abused: No     Review of Systems: General: negative for chills, fever, night sweats or weight changes.  Cardiovascular: negative for chest pain, dyspnea on exertion, edema, orthopnea, palpitations,  paroxysmal nocturnal dyspnea or shortness of breath Dermatological: negative for rash Respiratory: negative for cough or wheezing Urologic: negative for hematuria Abdominal: negative for nausea, vomiting, diarrhea, bright red blood per rectum, melena, or hematemesis Neurologic: negative for visual changes, syncope, or dizziness All other systems reviewed and are otherwise negative except as noted above.    Blood pressure (!) 140/88, pulse (!) 56, height '5\' 9"'$  (1.753 m), weight 194 lb 9.6 oz (88.3 kg), SpO2 98 %.  General appearance: alert and no distress Neck: no adenopathy, no carotid bruit, no JVD, supple, symmetrical, trachea midline, and thyroid not enlarged, symmetric, no tenderness/mass/nodules Lungs: clear to auscultation bilaterally Heart: regular rate and rhythm, S1, S2 normal, no murmur, click, rub or gallop Extremities: extremities normal, atraumatic, no cyanosis or edema Pulses: Diminished pedal pulses Skin: Skin color, texture, turgor normal. No rashes or lesions Neurologic: Grossly normal  EKG not performed today  ASSESSMENT AND PLAN:   Peripheral vascular disease [facility]) [name] was referred to me by Nicholes Rough, PA-C for evaluation of PAD.  His [name] is Dr. Irish Lack.  He has a history of ischemic heart disease status post recent coronary artery bypass grafting times 311/9/23 for two-vessel disease and non-STEMI.  Other problems include treated hyperlipidemia and tobacco abuse recently discontinued.  He is complained of claudication which is symmetric to the left several years with recent Doppler studies performed 07/24/2022 revealing a right ABI of 0.82 and a left of 0.77.  He did have a high-frequency signal in his right common femoral and distal right SFA as well as a high-frequency signal in his left common femoral with occluded distal left SFA.  I am going to begin him on Pletal 50 mg p.o. twice daily I will see him back in 3 months.  If this does not afford him  significant benefit we will talk about endovascular therapy.     Carl Harp MD FACP,FACC,FAHA, [facility] 09/04/2022 10:36 AM

## 2022-09-04 NOTE — Patient Instructions (Signed)
Medication Instructions:  Your physician has recommended you make the following change in your medication:   -Start cilostazol (pletal) '50mg'$  twice daily.  *If you need a refill on your cardiac medications before your next appointment, please call your pharmacy*   Follow-Up: At [facility], you and your health needs are our priority.  As part of our continuing mission to provide you with exceptional heart care, we have created designated Provider Care Teams.  These Care Teams include your primary Cardiologist (physician) and Advanced Practice Providers (APPs -  Physician Assistants and Nurse Practitioners) who all work together to provide you with the care you need, when you need it.  We recommend signing up for the patient portal called "MyChart".  Sign up information is provided on this After Visit Summary.  MyChart is used to connect with patients for Virtual Visits (Telemedicine).  Patients are able to view lab/test results, encounter notes, upcoming appointments, etc.  Non-urgent messages can be sent to your provider as well.   To learn more about what you can do with MyChart, go to [email or web address].    Your next appointment:   3 month(s)  The format for your next appointment:   In Person  Provider:   Quay Burow, MD

## 2022-09-07 NOTE — Progress Notes (Signed)
  Subjective:  Patient ID: Carl [name], male    DOB: 1956-06-22,  MRN: [id number]  Chief Complaint  Patient presents with   Ankle Pain    EST - L ANKLE / STILL EXPERIENCING PAIN    66 y.o. male presents with the above complaint. History confirmed with patient.  Still having pain in the last injection did not help  Objective:  Physical Exam: warm, good capillary refill, no trophic changes or ulcerative lesions, normal DP and PT pulses, and normal sensory exam. Left Foot:  Swelling compared to right side, there is pain and tenderness in the sinus tarsi and anterior ankle joint   Radiographs: Multiple views x-ray of left foot and ankle were taken: no fracture, dislocation, swelling or degenerative changes noted  Arthritis panel showed uric acid of 4.8, negative ANA and RF, sed rate of 2 Assessment:   1. Left ankle pain, unspecified chronicity   2. Sinus tarsi syndrome, left      Plan:  Patient was evaluated and treated and all questions answered.  His previous injection was not helpful.  I recommended further treatment again with methylprednisolone taper.  Also recommended advanced imaging as his previous x-rays were equivocal and his lab work was unrevealing.  MRI of the ankle has been ordered.  I will see him back after the study.  Return for after MRI to review.

## 2022-09-18 ENCOUNTER — Ambulatory Visit
Admission: RE | Admit: 2022-09-18 | Discharge: 2022-09-18 | Disposition: A | Discharge status: Home / Self Care | Payer: Medicare HMO | Source: Ambulatory Visit | Attending: Podiatry | Admitting: Podiatry

## 2022-09-18 DIAGNOSIS — M25572 Pain in left ankle and joints of left foot: Secondary | ICD-10-CM

## 2022-09-23 ENCOUNTER — Other Ambulatory Visit: Payer: Self-pay | Admitting: Surgical

## 2022-09-25 ENCOUNTER — Other Ambulatory Visit: Payer: Self-pay | Admitting: Surgical

## 2022-09-28 ENCOUNTER — Other Ambulatory Visit: Payer: Self-pay | Admitting: Surgical

## 2022-09-28 ENCOUNTER — Other Ambulatory Visit: Payer: Self-pay | Admitting: Physician Assistant

## 2022-09-29 MED FILL — Heparin Sodium (Porcine) Inj 1000 Unit/ML: Qty: 1000 | Status: AC

## 2022-09-29 MED FILL — Potassium Chloride Inj 2 mEq/ML: INTRAVENOUS | Qty: 40 | Status: AC

## 2022-09-29 MED FILL — Magnesium Sulfate Inj 50%: INTRAMUSCULAR | Qty: 10 | Status: AC

## 2022-09-30 ENCOUNTER — Ambulatory Visit: Payer: Medicare HMO | Admitting: Podiatry

## 2022-09-30 DIAGNOSIS — M659 Synovitis and tenosynovitis, unspecified: Secondary | ICD-10-CM

## 2022-10-05 NOTE — Progress Notes (Signed)
  Subjective:  Patient ID: Carl [name], male    DOB: 09-Mar-1956,  MRN: [id number]  Chief Complaint  Patient presents with   Tendonitis    Left ankle, MRI review    67 y.o. male presents with the above complaint. History confirmed with patient.  Still having pain he completed the MRI  Objective:  Physical Exam: warm, good capillary refill, no trophic changes or ulcerative lesions, normal DP and PT pulses, and normal sensory exam. Left Foot:  Swelling compared to right side, there is pain and tenderness along the peroneal tendons at posterior to and distal to the lateral malleolus    Radiographs: Multiple views x-ray of left foot and ankle were taken: no fracture, dislocation, swelling or degenerative changes noted  Arthritis panel showed uric acid of 4.8, negative ANA and RF, sed rate of 2    IMPRESSION: Split peroneus brevis tear at and below the lateral malleolus, length of tear approximately 4.4 cm in length. Reactive tenosynovitis of the peroneal tendons and edema in the lateral malleolus. Intact lateral ankle ligaments.   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     Electronically Signed   By: Maurine Simmering M.D.   On: 09/22/2022 09:41 Assessment:   1. Peroneal tenosynovitis [M65.9]       Plan:  Patient was evaluated and treated and all questions answered.  We reviewed the results of his MRI.  We discussed surgical and nonsurgical treatment.  I think currently with his cardiac history and relatively recent quad bypass that he would be a poor surgical candidate due to added risk.  I recommended rest immobilization in a cam boot and this was dispensed today.  We also discussed injection of cortisone along the tendon sheath for the tenosynovitis.  This was completed today following sterile prep with alcohol and 4 mg of dexamethasone was injected along the peroneal tendons into the sheath avoiding intrasubstance injection.  He tolerated this well  I will see him back in 1 month for follow-up  No follow-ups on file.

## 2022-10-27 ENCOUNTER — Ambulatory Visit (INDEPENDENT_AMBULATORY_CARE_PROVIDER_SITE_OTHER): Payer: Self-pay | Admitting: Cardiothoracic Surgery

## 2022-10-27 ENCOUNTER — Ambulatory Visit: Payer: Medicare HMO | Admitting: Cardiothoracic Surgery

## 2022-10-27 ENCOUNTER — Encounter: Payer: Self-pay | Admitting: Cardiothoracic Surgery

## 2022-10-27 VITALS — HR 76 | Resp 20 | Ht 69.0 in | Wt 206.0 lb

## 2022-10-27 DIAGNOSIS — Z09 Encounter for follow-up examination after completed treatment for conditions other than malignant neoplasm: Secondary | ICD-10-CM

## 2022-10-27 DIAGNOSIS — Z951 Presence of aortocoronary bypass graft: Secondary | ICD-10-CM

## 2022-10-27 DIAGNOSIS — G8918 Other acute postprocedural pain: Secondary | ICD-10-CM

## 2022-10-27 NOTE — Progress Notes (Signed)
HPI: Patient returns for final postop follow-up 3 months after urgent CABG x 3 for non-STEMI, severe multivessel CAD.  Patient continues to do well.  He denies angina or symptoms of CHF.  Surgical incisions are all healed. He has not smoked in 3 months.  He has maintained sinus rhythm and should be off amiodarone.  He understands the importance of a heart healthy diet and daily 20 to 30-minute walk.  Last chest x-ray was clear.  Patient does have some peripheral vascular disease with complaints of claudication and has been seen by cardiology and placed on Pletal.    Physical Exam: Pulse 76, resp. rate 20, height '5\' 9"'$  (1.753 m), weight 206 lb (93.4 kg), SpO2 98 %.        Exam    General- alert and comfortable.  Sternal incision well-healed.    Neck- no JVD, no cervical adenopathy palpable, no carotid bruit   Lungs- clear without rales, wheezes   Cor- regular rate and rhythm, no murmur , gallop   Abdomen- soft, non-tender   Extremities - warm, non-tender, minimal edema   Neuro- oriented, appropriate, no focal weakness  Diagnostic Tests: None  Impression: Excellent recovery after multivessel CABG  He has moderate left subclavian stenosis and had a left free IMA graft to the LAD. He understands blood pressures should be measured from the right arm. The patient is maintaining sinus rhythm, is now cleared to stop the amiodarone.  At 3 months postop he is now safe to go back to his normal activity levels as sternal precautions are lifted.  He is encouraged to follow a heart healthy lifestyle and continue smoking cessation.  He will be followed by the cardiology office and his primary care and return here as needed.  Plan: Return as needed.   [name], MD [facility] [phone]

## 2022-11-09 ENCOUNTER — Telehealth: Payer: Self-pay | Admitting: Physician Assistant

## 2022-11-09 NOTE — Telephone Encounter (Signed)
I received a fax from [employer] company recommending that he have screening for AAA given male smoker between 68-67 years of age. This patient is no longer under my care but has seen Dr. Gwenlyn Found for follow-up. I will route to him for review and advisement as to whether he feels this is appropriate to pursue.

## 2022-11-09 NOTE — Telephone Encounter (Signed)
Please arrange screening duplex of aorta per Dr. Gwenlyn Found (should be ordered under him). Thank you!
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
## 2022-11-25 ENCOUNTER — Ambulatory Visit: Payer: Medicare HMO | Attending: Interventional Cardiology | Admitting: Interventional Cardiology

## 2022-11-25 ENCOUNTER — Encounter: Payer: Self-pay | Admitting: Interventional Cardiology

## 2022-11-25 VITALS — BP 182/126 | HR 65 | Ht 69.0 in | Wt 214.4 lb

## 2022-11-25 DIAGNOSIS — E785 Hyperlipidemia, unspecified: Secondary | ICD-10-CM | POA: Diagnosis not present

## 2022-11-25 DIAGNOSIS — Z720 Tobacco use: Secondary | ICD-10-CM

## 2022-11-25 DIAGNOSIS — I10 Essential (primary) hypertension: Secondary | ICD-10-CM | POA: Diagnosis not present

## 2022-11-25 DIAGNOSIS — I700 Atherosclerosis of aorta: Secondary | ICD-10-CM

## 2022-11-25 DIAGNOSIS — I2510 Atherosclerotic heart disease of native coronary artery without angina pectoris: Secondary | ICD-10-CM

## 2022-11-25 DIAGNOSIS — I739 Peripheral vascular disease, unspecified: Secondary | ICD-10-CM | POA: Diagnosis not present

## 2022-11-25 MED ORDER — AMLODIPINE BESYLATE 5 MG PO TABS: 5.0000 mg | ORAL_TABLET | Freq: Every day | ORAL | 3 refills | Status: DC

## 2022-11-25 NOTE — Patient Instructions (Signed)
Medication Instructions:  Your physician has recommended you make the following change in your medication: Start Amlodipine 5 mg by mouth daily   *If you need a refill on your cardiac medications before your next appointment, please call your pharmacy*   Lab Work: none If you have labs (blood work) drawn today and your tests are completely normal, you will receive your results only by: [place] (if you have MyChart) OR A paper copy in the mail If you have any lab test that is abnormal or we need to change your treatment, we will call you to review the results.   Testing/Procedures: none   Follow-Up: At [facility], you and your health needs are our priority.  As part of our continuing mission to provide you with exceptional heart care, we have created designated Provider Care Teams.  These Care Teams include your primary Cardiologist (physician) and Advanced Practice Providers (APPs -  Physician Assistants and Nurse Practitioners) who all work together to provide you with the care you need, when you need it.  We recommend signing up for the patient portal called "MyChart".  Sign up information is provided on this After Visit Summary.  MyChart is used to connect with patients for Virtual Visits (Telemedicine).  Patients are able to view lab/test results, encounter notes, upcoming appointments, etc.  Non-urgent messages can be sent to your provider as well.   To learn more about what you can do with MyChart, go to [email or web address].    Your next appointment:   8 month(s)  Provider:   Larae Grooms, MD     Other Instructions

## 2022-11-25 NOTE — Telephone Encounter (Signed)
Pt has office visit on 3/13, will discuss ultrasound and reason for ordering.

## 2022-11-26 ENCOUNTER — Ambulatory Visit: Payer: Medicare HMO | Admitting: Podiatry

## 2022-11-26 DIAGNOSIS — M659 Synovitis and tenosynovitis, unspecified: Secondary | ICD-10-CM | POA: Diagnosis not present

## 2022-11-26 MED ORDER — METHYLPREDNISOLONE 4 MG PO TBPK: ORAL_TABLET | ORAL | 0 refills | Status: DC

## 2022-11-26 NOTE — Patient Instructions (Addendum)
Call Phone: [phone] to schedule PT with [facility]

## 2022-11-27 ENCOUNTER — Telehealth: Payer: Self-pay

## 2022-11-27 NOTE — Telephone Encounter (Signed)
Referral and demographics faxed to [facility] in [place] [phone]

## 2022-11-30 NOTE — Progress Notes (Signed)
  Subjective:  Patient ID: Carl [name], male    DOB: November 26, 1955,  MRN: [id number]  Chief Complaint  Patient presents with   Tendonitis    Left ankle, MRI review    67 y.o. male presents with the above complaint. History confirmed with patient.  Still having pain but so far has not helped with the injection  Objective:  Physical Exam: warm, good capillary refill, no trophic changes or ulcerative lesions, weakly palpable DP and PT pulses, and normal sensory exam. Left Foot:  Swelling compared to right side, there is pain and tenderness along the peroneal tendons at posterior to and distal to the lateral malleolus    Radiographs: Multiple views x-ray of left foot and ankle were taken: no fracture, dislocation, swelling or degenerative changes noted  Arthritis panel showed uric acid of 4.8, negative ANA and RF, sed rate of 2    IMPRESSION: Split peroneus brevis tear at and below the lateral malleolus, length of tear approximately 4.4 cm in length. Reactive tenosynovitis of the peroneal tendons and edema in the lateral malleolus. Intact lateral ankle ligaments.   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     Electronically Signed   By: Maurine Simmering M.D.   On: 09/22/2022 09:41 Assessment:   1. Peroneal tenosynovitis [M65.9]       Plan:  Patient was evaluated and treated and all questions answered.  So far has not had much improvement.  I recommended beginning physical therapy referral sent to [facility].  Also sent a second course of methylprednisolone taper.  He has an office visit next week with Dr. Gwenlyn Found to evaluate for revascularization, his noninvasive testing from November shows stenoses and occlusions in the CFA and SFA on the left side.  I discussed with him if he is to undergo surgical intervention on his tendon, he will require revascularization prior to this, additionally we will need to be 6 months out from his CABG which would be no  sooner than early May.  I will see him back in 1 month for reevaluation  No follow-ups on file.

## 2022-12-02 ENCOUNTER — Ambulatory Visit: Payer: Medicare HMO | Attending: Cardiovascular Disease | Admitting: Cardiovascular Disease

## 2022-12-02 ENCOUNTER — Encounter: Payer: Self-pay | Admitting: Cardiovascular Disease

## 2022-12-02 VITALS — BP 156/92 | HR 73 | Ht 69.0 in | Wt 212.0 lb

## 2022-12-02 DIAGNOSIS — I739 Peripheral vascular disease, unspecified: Secondary | ICD-10-CM

## 2022-12-02 NOTE — Progress Notes (Signed)
12/02/2022 Carl [name]   [birth year]  [id number]  Primary Physician Vicenta Aly, [place] Primary Cardiologist: Lorretta Harp MD Lupe Carney, [place]  HPI:  Carl [name] is a 67 y.o.   thin-appearing married Caucasian male father of 2 sons, grandfather of 2 grandchildren referred by Nicholes Rough, PA-C for evaluation of peripheral arterial disease.  His cardiologist is Dr. Irish Lack.  I last saw him in the office 09/04/2022.  He is a retired Theme park manager.  Factors include 75-pack-year tobacco abuse having quit at the time of his bypass surgery last month as well as hyperlipidemia.  There is no family history.  He had a remote stroke 5 to 6 years ago with no residual neurologic deficits.  He had 3 back surgeries as well.  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  He did have some postop A-fib.  He is complained of bilateral symmetric lifestyle-limiting claudication for the last several years with recent Doppler studies revealing ABIs in the 0.8 range with moderate common femoral disease bilaterally with high-grade distal right SFA disease and occluded distal left SFA.    Since I saw him 3 months ago he has not noticed any benefit from the addition of Pletal.  He still has lifestyle-limiting claudication and wishes to proceed with outpatient peripheral angiography and endovascular therapy.   Current Meds  Medication Sig   amiodarone (PACERONE) 200 MG tablet Take 1 tablet (200 mg total) by mouth daily.   amLODipine (NORVASC) 5 MG tablet Take 1 tablet (5 mg total) by mouth daily.   aspirin 81 MG EC tablet Take 81 mg by mouth daily.   cilostazol (PLETAL) 50 MG tablet Take 1 tablet (50 mg total) by mouth 2 (two) times daily.   clopidogrel (PLAVIX) 75 MG tablet Take 1 tablet (75 mg total) by mouth daily.   cyclobenzaprine (FLEXERIL) 10 MG tablet 3 (three) times daily as needed.   ezetimibe (ZETIA) 10 MG tablet Take 1 tablet (10 mg total) by mouth daily.    fenofibrate (TRICOR) 145 MG tablet Take 145 mg by mouth daily.    HYDROcodone-acetaminophen (NORCO/VICODIN) 5-325 MG tablet Take 1 tablet by mouth every 6 (six) hours as needed for moderate pain or severe pain.   levothyroxine (SYNTHROID) 125 MCG tablet Take 125 mcg by mouth daily.   methylPREDNISolone (MEDROL DOSEPAK) 4 MG TBPK tablet 6 day dose pack - take as directed   metoprolol tartrate (LOPRESSOR) 25 MG tablet Take 0.5 tablets (12.5 mg total) by mouth 2 (two) times daily.   nitroGLYCERIN (NITROSTAT) 0.4 MG SL tablet Place under the tongue.   pantoprazole (PROTONIX) 40 MG tablet Take 40 mg by mouth daily.   rosuvastatin (CRESTOR) 40 MG tablet Take 40 mg by mouth every morning.    [DISCONTINUED] oxyCODONE (OXY IR/ROXICODONE) 5 MG immediate release tablet Take 1 tablet (5 mg total) by mouth 2 (two) times daily as needed for severe pain.     No Known Allergies  Social History   Socioeconomic History   Marital status: Married    Spouse name: Not on file   Number of children: Not on file   Years of education: Not on file   Highest education level: Not on file  Occupational History   Not on file  Tobacco Use   Smoking status: Former    Packs/day: 1.00    Years: 45.00    Total pack years: 45.00  Types: Cigarettes    Quit date: 07/28/2022    Years since quitting: 0.3   Smokeless tobacco: Never  Vaping Use   Vaping Use: Never used  Substance and Sexual Activity   Alcohol use: Yes    Comment: OCCASIONAL BEER   Drug use: No   Sexual activity: Not on file  Other Topics Concern   Not on file  Social History Narrative   Not on file   Social Determinants of Health   Financial Resource Strain: Not on file  Food Insecurity: No Food Insecurity (08/03/2022)   Hunger Vital Sign    Worried About Running Out of Food in the Last Year: Never true    Ran Out of Food in the Last Year: Never true  Transportation Needs: No Transportation Needs (08/03/2022)   PRAPARE - Armed forces logistics/support/administrative officer (Medical): No    Lack of Transportation (Non-Medical): No  Physical Activity: Not on file  Stress: Not on file  Social Connections: Not on file  Intimate Partner Violence: Not At Risk (08/03/2022)   Humiliation, Afraid, Rape, and Kick questionnaire    Fear of Current or Ex-Partner: No    Emotionally Abused: No    Physically Abused: No    Sexually Abused: No     Review of Systems: General: negative for chills, fever, night sweats or weight changes.  Cardiovascular: negative for chest pain, dyspnea on exertion, edema, orthopnea, palpitations, paroxysmal nocturnal dyspnea or shortness of breath Dermatological: negative for rash Respiratory: negative for cough or wheezing Urologic: negative for hematuria Abdominal: negative for nausea, vomiting, diarrhea, bright red blood per rectum, melena, or hematemesis Neurologic: negative for visual changes, syncope, or dizziness All other systems reviewed and are otherwise negative except as noted above.    Blood pressure (!) 156/92, pulse 73, height '5\' 9"'$  (1.753 m), weight 212 lb (96.2 kg).  General appearance: alert and no distress Neck: no adenopathy, no carotid bruit, no JVD, supple, symmetrical, trachea midline, and thyroid not enlarged, symmetric, no tenderness/mass/nodules Lungs: clear to auscultation bilaterally Heart: regular rate and rhythm, S1, S2 normal, no murmur, click, rub or gallop Extremities: extremities normal, atraumatic, no cyanosis or edema Pulses: Diminished pedal pulses Skin: Skin color, texture, turgor normal. No rashes or lesions Neurologic: Grossly normal  EKG not performed today  ASSESSMENT AND PLAN:   Peripheral vascular disease [facility]) [name] returns for follow-up of his PAD.  I saw him mid December of last year and began him on Pletal 50 mg p.o. twice daily which has afforded him no clinical benefit.  He still has lifestyle-limiting claudication.  His Doppler study performed 07/24/2022  revealed a right ABI of 0.82 and a left of 0.77.  He did have bilateral common femoral artery disease, high-grade distal right SFA and short segment occlusion left SFA.  He wishes to proceed with outpatient peripheral angiography and endovascular therapy.     Lorretta Harp MD FACP,FACC,FAHA, [facility] 12/02/2022 10:01 AM

## 2022-12-02 NOTE — Patient Instructions (Addendum)
Medication Instructions:  Your physician has recommended you make the following change in your medication:   -Stop cilostazol (pletal)  *If you need a refill on your cardiac medications before your next appointment, please call your pharmacy*   Lab Work: Your physician recommends that you return for lab work in: 3-4 weeks for BMET & CBC  If you have labs (blood work) drawn today and your tests are completely normal, you will receive your results only by: [place] (if you have MyChart) OR A paper copy in the mail If you have any lab test that is abnormal or we need to change your treatment, we will call you to review the results.   Follow-Up: At [facility], you and your health needs are our priority.  As part of our continuing mission to provide you with exceptional heart care, we have created designated Provider Care Teams.  These Care Teams include your primary Cardiologist (physician) and Advanced Practice Providers (APPs -  Physician Assistants and Nurse Practitioners) who all work together to provide you with the care you need, when you need it.  We recommend signing up for the patient portal called "MyChart".  Sign up information is provided on this After Visit Summary.  MyChart is used to connect with patients for Virtual Visits (Telemedicine).  Patients are able to view lab/test results, encounter notes, upcoming appointments, etc.  Non-urgent messages can be sent to your provider as well.   To learn more about what you can do with MyChart, go to [email or web address].    Your next appointment:   2-3 week(s) after your procedure (4/25)  Provider:   Quay Burow, MD   Other Instructions       Cardiac/Peripheral Catheterization   You are scheduled for a Peripheral Angiogram on Thursday, April 25 with Dr. Quay Burow.  1. Please arrive at the Main Entrance A at [facility]: [place], [place] [address] on April 25 at 5:30 AM  (This time is two hours before your procedure to ensure your preparation). Free valet parking service is available. You will check in at ADMITTING. The support person will be asked to wait in the waiting room.  It is OK to have someone drop you off and come back when you are ready to be discharged.        Special note: Every effort is made to have your procedure done on time. Please understand that emergencies sometimes delay scheduled procedures.   . 2. Diet: Do not eat solid foods after midnight.  You may have clear liquids until 5 AM the day of the procedure.  3. Labs: You will need to have blood drawn in 3-4 weeks for BMET & CBC  4. Medication instructions in preparation for your procedure:    On the morning of your procedure, take Aspirin 81 mg and Plavix/Clopidogrel and any morning medicines NOT listed above.  You may use sips of water.  5. Plan to go home the same day, you will only stay overnight if medically necessary. 6. You MUST have a responsible adult to drive you home. 7. An adult MUST be with you the first 24 hours after you arrive home. 8. Bring a current list of your medications, and the last time and date medication taken. 9. Bring ID and current insurance cards. 10.Please wear clothes that are easy to get on and off and wear slip-on shoes.  Thank you for allowing [place] to care for you!   -- [facility]

## 2022-12-02 NOTE — Assessment & Plan Note (Signed)
Carl [name] returns for follow-up of his PAD.  I saw Carl [name] mid December of last year and began Carl [name] on Pletal 50 mg p.o. twice daily which has afforded Carl [name] no clinical benefit.  He still has lifestyle-limiting claudication.  His Doppler study performed 07/24/2022 revealed a right ABI of 0.82 and a left of 0.77.  He did have bilateral common femoral artery disease, high-grade distal right SFA and short segment occlusion left SFA.  He wishes to proceed with outpatient peripheral angiography and endovascular therapy.

## 2022-12-24 LAB — CBC
Hematocrit: 42.9 % (ref 37.5–51.0)
Hemoglobin: 14.1 g/dL (ref 13.0–17.7)
MCH: 28.9 pg (ref 26.6–33.0)
MCHC: 32.9 g/dL (ref 31.5–35.7)
MCV: 88 fL (ref 79–97)
Platelets: 211 10*3/uL (ref 150–450)
RBC: 4.88 x10E6/uL (ref 4.14–5.80)
RDW: 13.7 % (ref 11.6–15.4)
WBC: 6 10*3/uL (ref 3.4–10.8)

## 2022-12-24 LAB — BASIC METABOLIC PANEL
BUN/Creatinine Ratio: 10 (ref 10–24)
BUN: 14 mg/dL (ref 8–27)
CO2: 22 mmol/L (ref 20–29)
Calcium: 10.1 mg/dL (ref 8.6–10.2)
Chloride: 98 mmol/L (ref 96–106)
Creatinine, Ser: 1.47 mg/dL — ABNORMAL HIGH (ref 0.76–1.27)
Glucose: 85 mg/dL (ref 70–99)
Potassium: 4.5 mmol/L (ref 3.5–5.2)
Sodium: 136 mmol/L (ref 134–144)
eGFR: 52 mL/min/{1.73_m2} — ABNORMAL LOW (ref >59)

## 2022-12-30 ENCOUNTER — Ambulatory Visit: Payer: Medicare HMO | Admitting: Podiatry

## 2023-01-07 ENCOUNTER — Telehealth: Payer: Self-pay | Admitting: Cardiovascular Disease

## 2023-01-07 ENCOUNTER — Other Ambulatory Visit: Payer: Self-pay

## 2023-01-07 MED ORDER — CLOPIDOGREL BISULFATE 75 MG PO TABS: 75.0000 mg | ORAL_TABLET | Freq: Every day | ORAL | 3 refills | Status: DC

## 2023-01-07 MED ORDER — EZETIMIBE 10 MG PO TABS: 10.0000 mg | ORAL_TABLET | Freq: Every day | ORAL | 3 refills | Status: DC

## 2023-01-07 MED ORDER — METOPROLOL TARTRATE 25 MG PO TABS: 12.5000 mg | ORAL_TABLET | Freq: Two times a day (BID) | ORAL | 3 refills | Status: DC

## 2023-01-07 MED ORDER — AMIODARONE HCL 200 MG PO TABS: 200.0000 mg | ORAL_TABLET | Freq: Every day | ORAL | 3 refills | Status: DC

## 2023-01-07 NOTE — Telephone Encounter (Signed)
Calling to speak with nurse regarding pt's medications before upcoming procedure on 4/25. Call transferred

## 2023-01-07 NOTE — Telephone Encounter (Signed)
Spoke with Julien Girt, PA from [place] who was seeing this pt today in clinic. She discussed his medications with him and found out that he has not been taking most of his cardiac medications. We went over his medication list. Pt is not taking amiodarone, metoprolol tartrate, zetia, or plavix. Kirstin would like to stop his amlodipine. She does request that I send in refills for the medications discussed. [facility] [place]. Kirstin states she will call [facility] and get his medication list straight with them and will also give pt a call to make sure that he is clear on everything he is supposed to be taking. She states that pt tells her that he wants to be complaint and that he just didn't know what he was supposed to be taking. Pt is scheduled to have PV angiogram with Dr. Allyson Sabal on 4/25. Will also route to primary cardiologist to keep in the loop.

## 2023-01-07 NOTE — Telephone Encounter (Signed)
*  STAT* If patient is at the pharmacy, call can be transferred to refill team.   1. Which medications need to be refilled? (please list name of each medication and dose if known) amiodarone (PACERONE) 200 MG tablet   2. Which pharmacy/location (including street and city if local pharmacy) is medication to be sent to?[id number] [place], [place] - [address]   3. Do they need a 30 day or 90 day supply? 90  

## 2023-01-13 ENCOUNTER — Telehealth: Payer: Self-pay | Admitting: *Deleted

## 2023-01-13 NOTE — Telephone Encounter (Addendum)
Abdominal aortogram  scheduled at [facility] for: Thursday January 14, 2023 9:30 AM Arrival time [facility] Main Entrance A at: 5:30 AM-pre-procedure hydration  Nothing to eat after midnight prior to procedure, clear liquids until 5 AM day of procedure.  Medication instructions: -Usual morning medications can be taken with sips of water including aspirin 81 mg and Plavix 75 mg.  Patient confirms he is currently taking aspirin and Plavix.  Confirmed patient has responsible adult to drive home post procedure and be with patient first 24 hours after arriving home.  Plan to go home the same day, you will only stay overnight if medically necessary.  Reviewed procedure instructions/pre-procedure hydration with patient.

## 2023-01-14 ENCOUNTER — Encounter (HOSPITAL_COMMUNITY)
Admission: RE | Disposition: A | Discharge status: Home / Self Care | Payer: Self-pay | Source: Home / Self Care | Attending: Cardiovascular Disease

## 2023-01-14 ENCOUNTER — Ambulatory Visit (HOSPITAL_COMMUNITY)
Admission: RE | Admit: 2023-01-14 | Discharge: 2023-01-14 | Disposition: A | Discharge status: Home / Self Care | Payer: Medicare HMO | Attending: Cardiovascular Disease | Admitting: Cardiovascular Disease

## 2023-01-14 DIAGNOSIS — I739 Peripheral vascular disease, unspecified: Secondary | ICD-10-CM | POA: Diagnosis not present

## 2023-01-14 DIAGNOSIS — Z87891 Personal history of nicotine dependence: Secondary | ICD-10-CM | POA: Diagnosis not present

## 2023-01-14 DIAGNOSIS — Z951 Presence of aortocoronary bypass graft: Secondary | ICD-10-CM | POA: Insufficient documentation

## 2023-01-14 DIAGNOSIS — E785 Hyperlipidemia, unspecified: Secondary | ICD-10-CM | POA: Insufficient documentation

## 2023-01-14 DIAGNOSIS — I4891 Unspecified atrial fibrillation: Secondary | ICD-10-CM | POA: Diagnosis not present

## 2023-01-14 DIAGNOSIS — Z8673 Personal history of transient ischemic attack (TIA), and cerebral infarction without residual deficits: Secondary | ICD-10-CM | POA: Insufficient documentation

## 2023-01-14 DIAGNOSIS — I70213 Atherosclerosis of native arteries of extremities with intermittent claudication, bilateral legs: Secondary | ICD-10-CM | POA: Diagnosis not present

## 2023-01-14 HISTORY — PX: ABDOMINAL AORTOGRAM W/LOWER EXTREMITY: CATH118223

## 2023-01-14 SURGERY — ABDOMINAL AORTOGRAM W/LOWER EXTREMITY
Anesthesia: LOCAL

## 2023-01-14 MED ORDER — ROSUVASTATIN CALCIUM 20 MG PO TABS: 40.0000 mg | ORAL_TABLET | Freq: Every morning | ORAL | Status: DC

## 2023-01-14 MED ORDER — SODIUM CHLORIDE 0.9 % WEIGHT BASED INFUSION
1.0000 mL/kg/h | INTRAVENOUS | Status: DC
  Administered 2023-01-14: 1 mL/kg/h via INTRAVENOUS

## 2023-01-14 MED ORDER — HEPARIN (PORCINE) IN NACL 1000-0.9 UT/500ML-% IV SOLN
INTRAVENOUS | Status: DC | PRN
  Administered 2023-01-14 (×2): 500 mL

## 2023-01-14 MED ORDER — SODIUM CHLORIDE 0.9 % IV SOLN: INTRAVENOUS | Status: DC

## 2023-01-14 MED ORDER — HYDRALAZINE HCL 20 MG/ML IJ SOLN
INTRAMUSCULAR | Status: AC
  Filled 2023-01-14: qty 1

## 2023-01-14 MED ORDER — SODIUM CHLORIDE 0.9 % IV SOLN: 250.0000 mL | INTRAVENOUS | Status: DC | PRN

## 2023-01-14 MED ORDER — FENTANYL CITRATE (PF) 100 MCG/2ML IJ SOLN
INTRAMUSCULAR | Status: AC
  Filled 2023-01-14: qty 2

## 2023-01-14 MED ORDER — SODIUM CHLORIDE 0.9% FLUSH: 3.0000 mL | INTRAVENOUS | Status: DC | PRN

## 2023-01-14 MED ORDER — PANTOPRAZOLE SODIUM 40 MG PO TBEC: 40.0000 mg | DELAYED_RELEASE_TABLET | Freq: Every day | ORAL | Status: DC

## 2023-01-14 MED ORDER — ACETAMINOPHEN 325 MG PO TABS: 650.0000 mg | ORAL_TABLET | ORAL | Status: DC | PRN

## 2023-01-14 MED ORDER — FENOFIBRATE 160 MG PO TABS: 160.0000 mg | ORAL_TABLET | Freq: Every day | ORAL | Status: DC

## 2023-01-14 MED ORDER — SODIUM CHLORIDE 0.9% FLUSH: 3.0000 mL | Freq: Two times a day (BID) | INTRAVENOUS | Status: DC

## 2023-01-14 MED ORDER — ASPIRIN 81 MG PO TBEC: 81.0000 mg | DELAYED_RELEASE_TABLET | Freq: Every day | ORAL | Status: DC

## 2023-01-14 MED ORDER — LIDOCAINE HCL (PF) 1 % IJ SOLN
INTRAMUSCULAR | Status: DC | PRN
  Administered 2023-01-14: 20 mL via INTRADERMAL

## 2023-01-14 MED ORDER — MIDAZOLAM HCL 2 MG/2ML IJ SOLN
INTRAMUSCULAR | Status: AC
  Filled 2023-01-14: qty 2

## 2023-01-14 MED ORDER — MIDAZOLAM HCL 2 MG/2ML IJ SOLN
INTRAMUSCULAR | Status: DC | PRN
  Administered 2023-01-14: 1 mg via INTRAVENOUS

## 2023-01-14 MED ORDER — HYDRALAZINE HCL 20 MG/ML IJ SOLN
5.0000 mg | INTRAMUSCULAR | Status: DC | PRN
  Administered 2023-01-14: 5 mg via INTRAVENOUS
  Filled 2023-01-14: qty 1

## 2023-01-14 MED ORDER — AMIODARONE HCL 200 MG PO TABS: 200.0000 mg | ORAL_TABLET | Freq: Every day | ORAL | Status: DC

## 2023-01-14 MED ORDER — HYDRALAZINE HCL 20 MG/ML IJ SOLN
INTRAMUSCULAR | Status: DC | PRN
  Administered 2023-01-14: 10 mg via INTRAVENOUS

## 2023-01-14 MED ORDER — METOPROLOL TARTRATE 12.5 MG HALF TABLET: 12.5000 mg | ORAL_TABLET | Freq: Two times a day (BID) | ORAL | Status: DC

## 2023-01-14 MED ORDER — MORPHINE SULFATE (PF) 2 MG/ML IV SOLN
2.0000 mg | INTRAVENOUS | Status: DC | PRN
  Administered 2023-01-14: 2 mg via INTRAVENOUS
  Filled 2023-01-14: qty 1

## 2023-01-14 MED ORDER — CLOPIDOGREL BISULFATE 75 MG PO TABS: 75.0000 mg | ORAL_TABLET | Freq: Every day | ORAL | Status: DC

## 2023-01-14 MED ORDER — ASPIRIN 81 MG PO CHEW: 81.0000 mg | CHEWABLE_TABLET | ORAL | Status: DC

## 2023-01-14 MED ORDER — FENTANYL CITRATE (PF) 100 MCG/2ML IJ SOLN
INTRAMUSCULAR | Status: DC | PRN
  Administered 2023-01-14 (×2): 25 ug via INTRAVENOUS

## 2023-01-14 MED ORDER — ONDANSETRON HCL 4 MG/2ML IJ SOLN: 4.0000 mg | Freq: Four times a day (QID) | INTRAMUSCULAR | Status: DC | PRN

## 2023-01-14 MED ORDER — LABETALOL HCL 5 MG/ML IV SOLN: 10.0000 mg | INTRAVENOUS | Status: DC | PRN

## 2023-01-14 MED ORDER — LEVOTHYROXINE SODIUM 125 MCG PO TABS: 125.0000 ug | ORAL_TABLET | Freq: Every day | ORAL | Status: DC

## 2023-01-14 MED ORDER — NITROGLYCERIN 0.4 MG SL SUBL: 0.4000 mg | SUBLINGUAL_TABLET | SUBLINGUAL | Status: DC | PRN

## 2023-01-14 MED ORDER — EZETIMIBE 10 MG PO TABS: 10.0000 mg | ORAL_TABLET | Freq: Every day | ORAL | Status: DC

## 2023-01-14 MED ORDER — SODIUM CHLORIDE 0.9 % WEIGHT BASED INFUSION
3.0000 mL/kg/h | INTRAVENOUS | Status: DC
  Administered 2023-01-14: 3 mL/kg/h via INTRAVENOUS

## 2023-01-14 MED ORDER — LIDOCAINE HCL (PF) 1 % IJ SOLN
INTRAMUSCULAR | Status: AC
  Filled 2023-01-14: qty 30

## 2023-01-14 MED ORDER — CLOPIDOGREL BISULFATE 75 MG PO TABS: 75.0000 mg | ORAL_TABLET | ORAL | Status: DC

## 2023-01-14 SURGICAL SUPPLY — 13 items
CATH ANGIO 5F PIGTAIL 65CM (CATHETERS) IMPLANT
CATH CROSS OVER TEMPO 5F (CATHETERS) IMPLANT
CATH STRAIGHT 5FR 65CM (CATHETERS) IMPLANT
KIT MICROPUNCTURE NIT STIFF (SHEATH) IMPLANT
KIT PV (KITS) ×1 IMPLANT
SHEATH PINNACLE 5F 10CM (SHEATH) IMPLANT
STOPCOCK MORSE 400PSI 3WAY (MISCELLANEOUS) IMPLANT
SYR MEDRAD MARK 7 150ML (SYRINGE) ×1 IMPLANT
TRANSDUCER W/STOPCOCK (MISCELLANEOUS) ×1 IMPLANT
TRAY PV CATH (CUSTOM PROCEDURE TRAY) ×1 IMPLANT
TUBING CIL FLEX 10 FLL-RA (TUBING) IMPLANT
TUBING CONTRAST HIGH PRESS 48 (TUBING) IMPLANT
WIRE HITORQ VERSACORE ST 145CM (WIRE) IMPLANT

## 2023-01-14 NOTE — H&P (Signed)
01/14/2023 HAMAD WHYTE   26-Jun-1956  [id number]   Primary Physician Elizabeth Palau, FNP Primary Cardiologist: [name] MD Nicholes Calamity, [place]   HPI:  OTHMAN MASUR is a 67 y.o.   thin-appearing married Caucasian male father of 2 sons, grandfather of 2 grandchildren referred by Jari Favre, PA-C for evaluation of peripheral arterial disease.  His cardiologist is Dr. Eldridge Dace.  I last saw him in the office 09/04/2022.  He is a retired Designer, fashion/clothing.  Factors include 75-pack-year tobacco abuse having quit at the time of his bypass surgery last month as well as hyperlipidemia.  There is no family history.  He had a remote stroke 5 to 6 years ago with no residual neurologic deficits.  He had 3 back surgeries as well.  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  He did have some postop A-fib.  He is complained of bilateral symmetric lifestyle-limiting claudication for the last several years with recent Doppler studies revealing ABIs in the 0.8 range with moderate common femoral disease bilaterally with high-grade distal right SFA disease and occluded distal left SFA.     Since I saw him 3 months ago he has not noticed any benefit from the addition of Pletal.  He still has lifestyle-limiting claudication and wishes to proceed with outpatient peripheral angiography and endovascular therapy.     Active Medications      Current Meds  Medication Sig   amiodarone (PACERONE) 200 MG tablet Take 1 tablet (200 mg total) by mouth daily.   amLODipine (NORVASC) 5 MG tablet Take 1 tablet (5 mg total) by mouth daily.   aspirin 81 MG EC tablet Take 81 mg by mouth daily.   cilostazol (PLETAL) 50 MG tablet Take 1 tablet (50 mg total) by mouth 2 (two) times daily.   clopidogrel (PLAVIX) 75 MG tablet Take 1 tablet (75 mg total) by mouth daily.   cyclobenzaprine (FLEXERIL) 10 MG tablet 3 (three) times daily as needed.   ezetimibe (ZETIA) 10 MG tablet  Take 1 tablet (10 mg total) by mouth daily.   fenofibrate (TRICOR) 145 MG tablet Take 145 mg by mouth daily.    HYDROcodone-acetaminophen (NORCO/VICODIN) 5-325 MG tablet Take 1 tablet by mouth every 6 (six) hours as needed for moderate pain or severe pain.   levothyroxine (SYNTHROID) 125 MCG tablet Take 125 mcg by mouth daily.   methylPREDNISolone (MEDROL DOSEPAK) 4 MG TBPK tablet 6 day dose pack - take as directed   metoprolol tartrate (LOPRESSOR) 25 MG tablet Take 0.5 tablets (12.5 mg total) by mouth 2 (two) times daily.   nitroGLYCERIN (NITROSTAT) 0.4 MG SL tablet Place under the tongue.   pantoprazole (PROTONIX) 40 MG tablet Take 40 mg by mouth daily.   rosuvastatin (CRESTOR) 40 MG tablet Take 40 mg by mouth every morning.    [DISCONTINUED] oxyCODONE (OXY IR/ROXICODONE) 5 MG immediate release tablet Take 1 tablet (5 mg total) by mouth 2 (two) times daily as needed for severe pain.        No Known Allergies   Social History         Socioeconomic History   Marital status: Married      Spouse name: Not on file   Number of children: Not on file   Years of education: Not on file   Highest education level: Not on file  Occupational History  Not on file  Tobacco Use   Smoking status: Former      Packs/day: 1.00      Years: 45.00      Total pack years: 45.00      Types: Cigarettes      Quit date: 07/28/2022      Years since quitting: 0.3   Smokeless tobacco: Never  Vaping Use   Vaping Use: Never used  Substance and Sexual Activity   Alcohol use: Yes      Comment: OCCASIONAL BEER   Drug use: No   Sexual activity: Not on file  Other Topics Concern   Not on file  Social History Narrative   Not on file    Social Determinants of Health        Financial Resource Strain: Not on file  Food Insecurity: No Food Insecurity (08/03/2022)    Hunger Vital Sign     Worried About Running Out of Food in the Last Year: Never true     Ran Out of Food in the Last Year: Never true   Transportation Needs: No Transportation Needs (08/03/2022)    PRAPARE - Therapist, art (Medical): No     Lack of Transportation (Non-Medical): No  Physical Activity: Not on file  Stress: Not on file  Social Connections: Not on file  Intimate Partner Violence: Not At Risk (08/03/2022)    Humiliation, Afraid, Rape, and Kick questionnaire     Fear of Current or Ex-Partner: No     Emotionally Abused: No     Physically Abused: No     Sexually Abused: No      Review of Systems: General: negative for chills, fever, night sweats or weight changes.  Cardiovascular: negative for chest pain, dyspnea on exertion, edema, orthopnea, palpitations, paroxysmal nocturnal dyspnea or shortness of breath Dermatological: negative for rash Respiratory: negative for cough or wheezing Urologic: negative for hematuria Abdominal: negative for nausea, vomiting, diarrhea, bright red blood per rectum, melena, or hematemesis Neurologic: negative for visual changes, syncope, or dizziness All other systems reviewed and are otherwise negative except as noted above.       Blood pressure (!) 156/92, pulse 73, height  (1.753 m), weight 212 lb (96.2 kg).  General appearance: alert and no distress Neck: no adenopathy, no carotid bruit, no JVD, supple, symmetrical, trachea midline, and thyroid not enlarged, symmetric, no tenderness/mass/nodules Lungs: clear to auscultation bilaterally Heart: regular rate and rhythm, S1, S2 normal, no murmur, click, rub or gallop Extremities: extremities normal, atraumatic, no cyanosis or edema Pulses: Diminished pedal pulses Skin: Skin color, texture, turgor normal. No rashes or lesions Neurologic: Grossly normal   EKG not performed today   ASSESSMENT AND PLAN:    Peripheral vascular disease [facility]) Mr. Gatson returns for follow-up of his PAD.  I saw him mid December of last year and began him on Pletal 50 mg p.o. twice daily which has afforded him no  clinical benefit.  He still has lifestyle-limiting claudication.  His Doppler study performed 07/24/2022 revealed a right ABI of 0.82 and a left of 0.77.  He did have bilateral common femoral artery disease, high-grade distal right SFA and short segment occlusion left SFA.  He wishes to proceed with outpatient peripheral angiography and endovascular therapy.    [name], M.D., FACP, [facility], [facility] [address] [place], [place]  [address]  [phone] 01/14/2023 7:18 AM

## 2023-01-14 NOTE — Interval H&P Note (Signed)
History and Physical Interval Note:  01/14/2023 9:46 AM  Carl [name]  has presented today for surgery, with the diagnosis of pad.  The various methods of treatment have been discussed with the patient and family. After consideration of risks, benefits and other options for treatment, the patient has consented to  Procedure(s): ABDOMINAL AORTOGRAM W/LOWER EXTREMITY ([name]) as a surgical intervention.  The patient's history has been reviewed, patient examined, no change in status, stable for surgery.  I have reviewed the patient's chart and labs.  Questions were answered to the patient's satisfaction.     [name]

## 2023-01-15 ENCOUNTER — Encounter (HOSPITAL_COMMUNITY): Payer: Self-pay | Admitting: Cardiovascular Disease

## 2023-01-20 ENCOUNTER — Ambulatory Visit: Payer: Medicare HMO | Admitting: Podiatry

## 2023-02-02 ENCOUNTER — Ambulatory Visit: Payer: Medicare HMO | Attending: Cardiovascular Disease | Admitting: Cardiovascular Disease

## 2023-02-02 ENCOUNTER — Encounter: Payer: Self-pay | Admitting: Cardiovascular Disease

## 2023-02-02 VITALS — BP 159/90 | HR 70 | Ht 69.0 in | Wt 217.8 lb

## 2023-02-02 DIAGNOSIS — I739 Peripheral vascular disease, unspecified: Secondary | ICD-10-CM | POA: Diagnosis not present

## 2023-02-02 NOTE — Progress Notes (Signed)
Mr. Stolzman returns today for follow-up of his recent peripheral angiogram which I performed 01/14/2023.  I demonstrated high-grade calcified bilateral common femoral artery stenoses, 90% left external iliac artery stenosis and a 90% mid right SFA and short CTO left SFA with three-vessel runoff.  He did stop smoking in November at the time of his bypass grafting.  He is functionally limited.  I believe he would benefit from bilateral common femoral endarterectomies with patch angioplasty after which we can address his SFA disease percutaneously.  I am referring him to Dr. Clotilde Dieter for surgical evaluation and will see him back in 3 months for follow-up.  [name], M.D., FACP, [facility], [facility] [address] [place], [place]  [address]  [phone] 02/02/2023 10:07 AM

## 2023-02-02 NOTE — Patient Instructions (Signed)
Medication Instructions:  Your physician recommends that you continue on your current medications as directed. Please refer to the Current Medication list given to you today.  *If you need a refill on your cardiac medications before your next appointment, please call your pharmacy*   Follow-Up: At [facility], you and your health needs are our priority.  As part of our continuing mission to provide you with exceptional heart care, we have created designated Provider Care Teams.  These Care Teams include your primary Cardiologist (physician) and Advanced Practice Providers (APPs -  Physician Assistants and Nurse Practitioners) who all work together to provide you with the care you need, when you need it.  We recommend signing up for the patient portal called "MyChart".  Sign up information is provided on this After Visit Summary.  MyChart is used to connect with patients for Virtual Visits (Telemedicine).  Patients are able to view lab/test results, encounter notes, upcoming appointments, etc.  Non-urgent messages can be sent to your provider as well.   To learn more about what you can do with MyChart, go to [email or web address].    Your next appointment:   3 month(s)  Provider:   Jonathan Berry, MD  

## 2023-02-09 ENCOUNTER — Ambulatory Visit: Payer: Medicare HMO | Admitting: Vascular Surgery

## 2023-02-09 ENCOUNTER — Encounter: Payer: Self-pay | Admitting: Vascular Surgery

## 2023-02-09 VITALS — BP 165/108 | HR 66 | Temp 98.4°F | Resp 16 | Ht 69.0 in | Wt 220.0 lb

## 2023-02-09 DIAGNOSIS — I70219 Atherosclerosis of native arteries of extremities with intermittent claudication, unspecified extremity: Secondary | ICD-10-CM | POA: Insufficient documentation

## 2023-02-09 DIAGNOSIS — I70213 Atherosclerosis of native arteries of extremities with intermittent claudication, bilateral legs: Secondary | ICD-10-CM
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
## 2023-02-12 ENCOUNTER — Other Ambulatory Visit: Payer: Self-pay | Admitting: *Deleted

## 2023-02-12 DIAGNOSIS — I70213 Atherosclerosis of native arteries of extremities with intermittent claudication, bilateral legs: Secondary | ICD-10-CM

## 2023-03-02 NOTE — Progress Notes (Signed)
Surgical Instructions    Your procedure is scheduled on Friday June 21st.  Report to [facility] Main Entrance "A" at 5:30 A.M., then check in with the Admitting office.  Call this number if you have problems the morning of surgery:  [phone]   If you have any questions prior to your surgery date call [phone]: Open Monday-Friday 8am-4pm If you experience any cold or flu symptoms such as cough, fever, chills, shortness of breath, etc. between now and your scheduled surgery, please notify us at the above number     Remember:  Do not eat or drink after midnight the night before your surgery   Take these medicines the morning of surgery with A SIP OF WATER: amiodarone (PACERONE) 200 MG tablet  aspirin 81 MG EC tablet  ezetimibe (ZETIA) 10 MG tablet  fenofibrate (TRICOR) 145 MG tablet  levothyroxine (SYNTHROID) 125 MCG tablet  metoprolol tartrate (LOPRESSOR) 25 MG tablet  pantoprazole (PROTONIX) 40 MG tablet  rosuvastatin (CRESTOR) 40 MG tablet   IF NEEDED  HYDROcodone-acetaminophen (NORCO/VICODIN) 5-325 MG tablet  nitroGLYCERIN (NITROSTAT) 0.4 MG SL tablet   Follow your surgeon's instructions on when to stop Plavix.  If no instructions were given by your surgeon then you will need to call the office to get those instructions. Generally Plavix is held 5 days prior.     As of today, STOP taking any Aleve, Naproxen, Ibuprofen, Motrin, Advil, Goody's, BC's, all herbal medications, fish oil, and all vitamins.           Do not wear jewelry  Do not wear lotions, powders, cologne or deodorant. Do not shave 48 hours prior to surgery.  Men may shave face and neck. Do not bring valuables to the hospital. Do not wear nail polish  [facility] is not responsible for any belongings or valuables.    Do NOT Smoke (Tobacco/Vaping)  24 hours prior to your procedure  If you use a CPAP at night, you may bring your mask for your overnight stay.   Contacts, glasses, hearing aids, dentures  or partials may not be worn into surgery, please bring cases for these belongings   For patients admitted to the hospital, discharge time will be determined by your treatment team.   Patients discharged the day of surgery will not be allowed to drive home, and someone needs to stay with them for 24 hours.   SURGICAL WAITING ROOM VISITATION Patients having surgery or a procedure may have no more than 2 support people in the waiting area - these visitors may rotate.   Children under the age of 38 must have an adult with them who is not the patient. If the patient needs to stay at the hospital during part of their recovery, the visitor guidelines for inpatient rooms apply. Pre-op nurse will coordinate an appropriate time for 1 support person to accompany patient in pre-op.  This support person may not rotate.   Please refer to [email or web address] for the visitor guidelines for Inpatients (after your surgery is over and you are in a regular room).    Special instructions:    Oral Hygiene is also important to reduce your risk of infection.  Remember - BRUSH YOUR TEETH THE MORNING OF SURGERY WITH YOUR REGULAR TOOTHPASTE   [facility]- Preparing For Surgery  Before surgery, you can play an important role. Because skin is not sterile, your skin needs to be as free of germs as possible. You can reduce the number of germs on your skin  by washing with CHG (chlorahexidine gluconate) Soap before surgery.  CHG is an antiseptic cleaner which kills germs and bonds with the skin to continue killing germs even after washing.     Please do not use if you have an allergy to CHG or antibacterial soaps. If your skin becomes reddened/irritated stop using the CHG.  Do not shave (including legs and underarms) for at least 48 hours prior to first CHG shower. It is OK to shave your face.  Please follow these instructions carefully.     Shower the NIGHT BEFORE  SURGERY and the MORNING OF SURGERY with CHG Soap.   If you chose to wash your hair, wash your hair first as usual with your normal shampoo. After you shampoo, rinse your hair and body thoroughly to remove the shampoo.  Then [employer] and genitals (private parts) with your normal soap and rinse thoroughly to remove soap.  After that Use CHG Soap as you would any other liquid soap. You can apply CHG directly to the skin and wash gently with a scrungie or a clean washcloth.   Apply the CHG Soap to your body ONLY FROM THE NECK DOWN.  Do not use on open wounds or open sores. Avoid contact with your eyes, ears, mouth and genitals (private parts). Wash Face and genitals (private parts)  with your normal soap.   Wash thoroughly, paying special attention to the area where your surgery will be performed.  Thoroughly rinse your body with warm water from the neck down.  DO NOT shower/wash with your normal soap after using and rinsing off the CHG Soap.  Pat yourself dry with a CLEAN TOWEL.  Wear CLEAN PAJAMAS to bed the night before surgery  Place CLEAN SHEETS on your bed the night before your surgery  DO NOT SLEEP WITH PETS.   Day of Surgery:  Take a shower with CHG soap. Wear Clean/Comfortable clothing the morning of surgery Do not apply any deodorants/lotions.   Remember to brush your teeth WITH YOUR REGULAR TOOTHPASTE.    If you received a COVID test during your pre-op visit, it is requested that you wear a mask when out in public, stay away from anyone that may not be feeling well, and notify your surgeon if you develop symptoms. If you have been in contact with anyone that has tested positive in the last 10 days, please notify your surgeon.    Please read over the following fact sheets that you were given.

## 2023-03-03 ENCOUNTER — Encounter (HOSPITAL_COMMUNITY): Payer: Self-pay

## 2023-03-03 ENCOUNTER — Encounter (HOSPITAL_COMMUNITY)
Admission: RE | Admit: 2023-03-03 | Discharge: 2023-03-03 | Disposition: A | Discharge status: Home / Self Care | Payer: Medicare HMO | Source: Ambulatory Visit | Attending: Vascular Surgery | Admitting: Vascular Surgery

## 2023-03-03 ENCOUNTER — Other Ambulatory Visit: Payer: Self-pay

## 2023-03-03 VITALS — BP 150/85 | HR 77 | Temp 97.6°F | Resp 18 | Ht 69.0 in | Wt 217.0 lb

## 2023-03-03 DIAGNOSIS — Z01818 Encounter for other preprocedural examination: Secondary | ICD-10-CM | POA: Diagnosis present

## 2023-03-03 DIAGNOSIS — I70213 Atherosclerosis of native arteries of extremities with intermittent claudication, bilateral legs: Secondary | ICD-10-CM | POA: Insufficient documentation

## 2023-03-03 DIAGNOSIS — Z7901 Long term (current) use of anticoagulants: Secondary | ICD-10-CM | POA: Insufficient documentation

## 2023-03-03 LAB — TYPE AND SCREEN
ABO/RH(D): A POS
Antibody Screen: NEGATIVE

## 2023-03-03 LAB — URINALYSIS, ROUTINE W REFLEX MICROSCOPIC
Bilirubin Urine: NEGATIVE
Glucose, UA: NEGATIVE mg/dL
Ketones, ur: NEGATIVE mg/dL
Leukocytes,Ua: NEGATIVE
Nitrite: NEGATIVE
Protein, ur: 30 mg/dL — AB
Specific Gravity, Urine: 1.021 (ref 1.005–1.030)
pH: 5 (ref 5.0–8.0)

## 2023-03-03 LAB — COMPREHENSIVE METABOLIC PANEL
ALT: 60 U/L — ABNORMAL HIGH (ref 0–44)
AST: 47 U/L — ABNORMAL HIGH (ref 15–41)
Albumin: 4.3 g/dL (ref 3.5–5.0)
Alkaline Phosphatase: 38 U/L (ref 38–126)
Anion gap: 10 (ref 5–15)
BUN: 15 mg/dL (ref 8–23)
CO2: 22 mmol/L (ref 22–32)
Calcium: 9.4 mg/dL (ref 8.9–10.3)
Chloride: 100 mmol/L (ref 98–111)
Creatinine, Ser: 1.43 mg/dL — ABNORMAL HIGH (ref 0.61–1.24)
GFR, Estimated: 54 mL/min — ABNORMAL LOW (ref >60)
Glucose, Bld: 141 mg/dL — ABNORMAL HIGH (ref 70–99)
Potassium: 3.6 mmol/L (ref 3.5–5.1)
Sodium: 132 mmol/L — ABNORMAL LOW (ref 135–145)
Total Bilirubin: 0.8 mg/dL (ref 0.3–1.2)
Total Protein: 7 g/dL (ref 6.5–8.1)

## 2023-03-03 LAB — SURGICAL PCR SCREEN
MRSA, PCR: NEGATIVE
Staphylococcus aureus: NEGATIVE

## 2023-03-03 LAB — CBC
HCT: 40.8 % (ref 39.0–52.0)
Hemoglobin: 13.8 g/dL (ref 13.0–17.0)
MCH: 30.7 pg (ref 26.0–34.0)
MCHC: 33.8 g/dL (ref 30.0–36.0)
MCV: 90.7 fL (ref 80.0–100.0)
Platelets: 225 10*3/uL (ref 150–400)
RBC: 4.5 MIL/uL (ref 4.22–5.81)
RDW: 12.5 % (ref 11.5–15.5)
WBC: 7 10*3/uL (ref 4.0–10.5)
nRBC: 0 % (ref 0.0–0.2)

## 2023-03-03 LAB — APTT: aPTT: 42 seconds — ABNORMAL HIGH (ref 24–36)

## 2023-03-03 LAB — PROTIME-INR
INR: 1.1 (ref 0.8–1.2)
Prothrombin Time: 13.9 seconds (ref 11.4–15.2)

## 2023-03-03 NOTE — Progress Notes (Signed)
PCP - Wilmont Blas Cardiologist - Juliane Lack, Obie Dredge  PPM/ICD - denies Device Orders -  Rep Notified -   Chest x-ray - na EKG - 11/25/22 Stress Test - 07/01/22 ECHO - 07/30/22 Cardiac Cath - 07/28/22  Sleep Study - denies CPAP -   Fasting Blood Sugar -na  Checks Blood Sugar _____ times a day  Last dose of GLP1 agonist-  na GLP1 instructions:   Blood Thinner Instructions:per pt,he was instructed by surgeon to stop Plavix 6/15 Aspirin Instructions: continue  ERAS Protcol -no PRE-SURGERY Ensure or G2-   COVID TEST- na   Anesthesia review: yes- history of coronary artery disease,CABG,stroke  Patient denies shortness of breath, fever, cough and chest pain at PAT appointment   All instructions explained to the patient, with a verbal understanding of the material. Patient agrees to go over the instructions while at home for a better understanding. Patient also instructed to self quarantine after being tested for COVID-19. The opportunity to ask questions was provided.

## 2023-03-04 NOTE — Progress Notes (Signed)
Anesthesia Chart Review:  Follows with cardiology for history of HTN, PAD, HLD, CAD s/p CABG.  Catheterization in November 2023 showed severe calcific three-vessel CAD including the LAD and RCA and he subsequently underwent CABG x 3 by Dr. Maren Beach.  Patient had a prior diagnosis of vasospastic angina with cath in 2011 showing nonobstructive disease as well as vasospasm.  He was recently evaluated by cardiologist Dr. Allyson Sabal for PAD and claudication.  Angiogram 01/14/2023 showed high-grade calcified bilateral common femoral artery stenosis, 90% left external iliac artery stenosis, and a 90% mid right SFA and short CTO left SFA with three-vessel runoff.  Dr. Allyson Sabal referred patient to Dr. Chestine Spore for surgical evaluation.  Patient reported last dose Plavix 03/06/2023.  Preop labs reviewed, creatinine mildly elevated 1.43, otherwise unremarkable.  EKG 11/25/2022: Sinus rhythm with occasional PVCs.  Rate 65.  ST and T wave abnormality, consider inferior ischemia.  ST and T wave abnormality, consider anterolateral ischemia.  Dr. Eldridge Dace commented  this is likely LVH pattern similar to prior but more pronounced.  TEE 08-17-22 (IntraOp, CABG): POST-OP IMPRESSIONS  Overall, there were no significant changes from pre-bypass.  _ Comments: Good LVFx post bypass, no changes in other findings.   PRE-OP FINDINGS   Left Ventricle: The left ventricle has normal systolic function, with an  ejection fraction of 55-60%. The cavity size was normal. No evidence of  left ventricular regional wall motion abnormalities. There is moderate  left ventricular hypertrophy.    Right Ventricle: The right ventricle has normal systolic function. The  cavity was normal. There is no increase in right ventricular wall  thickness.   Left Atrium: Left atrial size was normal in size. No left atrial/left  atrial appendage thrombus was detected.   Right Atrium: Right atrial size was normal in size.   Interatrial Septum: No atrial  level shunt detected by color flow Doppler.   Pericardium: There is no evidence of pericardial effusion.   Mitral Valve: The mitral valve is normal in structure. Mitral valve  regurgitation is mild by color flow Doppler. There is No evidence of  mitral stenosis.   Tricuspid Valve: The tricuspid valve was normal in structure. Tricuspid  valve regurgitation is trivial by color flow Doppler. No evidence of  tricuspid stenosis is present.   Aortic Valve: The aortic valve is normal in structure. Aortic valve  regurgitation was not visualized by color flow Doppler. There is no  stenosis of the aortic valve.    Pulmonic Valve: The pulmonic valve was normal in structure.  Pulmonic valve regurgitation is trivial by color flow Doppler.    Aorta: The ascending aorta is normal in size and structure. The ascending  aorta was not well visualized. There is evidence of plaque in the  descending aorta; Grade III, measuring 3-76mm in size.     Zannie Cove [facility] Short Stay Center/Anesthesiology Phone [phone] 03/04/2023 12:21 PM
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
## 2023-03-12 ENCOUNTER — Other Ambulatory Visit: Payer: Self-pay

## 2023-03-12 ENCOUNTER — Inpatient Hospital Stay (HOSPITAL_COMMUNITY)
Admission: RE | Admit: 2023-03-12 | Discharge: 2023-03-13 | DRG: 271 | Disposition: A | Discharge status: Home / Self Care | Payer: Medicare HMO | Attending: Vascular Surgery | Admitting: Vascular Surgery

## 2023-03-12 ENCOUNTER — Inpatient Hospital Stay (HOSPITAL_COMMUNITY): Payer: Medicare HMO | Admitting: Physician Assistant

## 2023-03-12 ENCOUNTER — Encounter (HOSPITAL_COMMUNITY)
Admission: RE | Disposition: A | Discharge status: Home / Self Care | Payer: Self-pay | Source: Home / Self Care | Attending: Vascular Surgery

## 2023-03-12 ENCOUNTER — Inpatient Hospital Stay (HOSPITAL_COMMUNITY): Payer: Medicare HMO | Admitting: Anesthesiology

## 2023-03-12 ENCOUNTER — Telehealth: Payer: Self-pay | Admitting: Vascular Surgery

## 2023-03-12 ENCOUNTER — Encounter (HOSPITAL_COMMUNITY): Payer: Self-pay | Admitting: Vascular Surgery

## 2023-03-12 DIAGNOSIS — Z8673 Personal history of transient ischemic attack (TIA), and cerebral infarction without residual deficits: Secondary | ICD-10-CM | POA: Diagnosis not present

## 2023-03-12 DIAGNOSIS — I2510 Atherosclerotic heart disease of native coronary artery without angina pectoris: Secondary | ICD-10-CM | POA: Diagnosis present

## 2023-03-12 DIAGNOSIS — Z7902 Long term (current) use of antithrombotics/antiplatelets: Secondary | ICD-10-CM

## 2023-03-12 DIAGNOSIS — E871 Hypo-osmolality and hyponatremia: Secondary | ICD-10-CM | POA: Diagnosis present

## 2023-03-12 DIAGNOSIS — I10 Essential (primary) hypertension: Secondary | ICD-10-CM | POA: Diagnosis present

## 2023-03-12 DIAGNOSIS — Z951 Presence of aortocoronary bypass graft: Secondary | ICD-10-CM | POA: Diagnosis not present

## 2023-03-12 DIAGNOSIS — Z87891 Personal history of nicotine dependence: Secondary | ICD-10-CM

## 2023-03-12 DIAGNOSIS — I739 Peripheral vascular disease, unspecified: Secondary | ICD-10-CM | POA: Diagnosis not present

## 2023-03-12 DIAGNOSIS — E039 Hypothyroidism, unspecified: Secondary | ICD-10-CM | POA: Diagnosis present

## 2023-03-12 DIAGNOSIS — I25119 Atherosclerotic heart disease of native coronary artery with unspecified angina pectoris: Secondary | ICD-10-CM

## 2023-03-12 DIAGNOSIS — I70222 Atherosclerosis of native arteries of extremities with rest pain, left leg: Secondary | ICD-10-CM | POA: Diagnosis present

## 2023-03-12 DIAGNOSIS — Z8249 Family history of ischemic heart disease and other diseases of the circulatory system: Secondary | ICD-10-CM | POA: Diagnosis not present

## 2023-03-12 DIAGNOSIS — I252 Old myocardial infarction: Secondary | ICD-10-CM | POA: Diagnosis not present

## 2023-03-12 DIAGNOSIS — E785 Hyperlipidemia, unspecified: Secondary | ICD-10-CM | POA: Diagnosis present

## 2023-03-12 DIAGNOSIS — K219 Gastro-esophageal reflux disease without esophagitis: Secondary | ICD-10-CM | POA: Diagnosis present

## 2023-03-12 DIAGNOSIS — Z79890 Hormone replacement therapy (postmenopausal): Secondary | ICD-10-CM | POA: Diagnosis not present

## 2023-03-12 DIAGNOSIS — I70212 Atherosclerosis of native arteries of extremities with intermittent claudication, left leg: Principal | ICD-10-CM | POA: Diagnosis present

## 2023-03-12 DIAGNOSIS — Z79899 Other long term (current) drug therapy: Secondary | ICD-10-CM

## 2023-03-12 DIAGNOSIS — Z7982 Long term (current) use of aspirin: Secondary | ICD-10-CM

## 2023-03-12 DIAGNOSIS — I70219 Atherosclerosis of native arteries of extremities with intermittent claudication, unspecified extremity: Principal | ICD-10-CM | POA: Diagnosis present

## 2023-03-12 HISTORY — PX: ENDARTERECTOMY FEMORAL: SHX5804

## 2023-03-12 HISTORY — PX: PATCH ANGIOPLASTY: SHX6230

## 2023-03-12 LAB — CBC
HCT: 37.4 % — ABNORMAL LOW (ref 39.0–52.0)
Hemoglobin: 12.6 g/dL — ABNORMAL LOW (ref 13.0–17.0)
MCH: 30.4 pg (ref 26.0–34.0)
MCHC: 33.7 g/dL (ref 30.0–36.0)
MCV: 90.1 fL (ref 80.0–100.0)
Platelets: 198 10*3/uL (ref 150–400)
RBC: 4.15 MIL/uL — ABNORMAL LOW (ref 4.22–5.81)
RDW: 12.3 % (ref 11.5–15.5)
WBC: 8.5 10*3/uL (ref 4.0–10.5)
nRBC: 0 % (ref 0.0–0.2)

## 2023-03-12 LAB — POCT ACTIVATED CLOTTING TIME
Activated Clotting Time: 201 seconds
Activated Clotting Time: 250 seconds

## 2023-03-12 LAB — CREATININE, SERUM
Creatinine, Ser: 1.51 mg/dL — ABNORMAL HIGH (ref 0.61–1.24)
GFR, Estimated: 51 mL/min — ABNORMAL LOW (ref >60)

## 2023-03-12 SURGERY — ENDARTERECTOMY, FEMORAL
Anesthesia: General | Site: Groin | Laterality: Left

## 2023-03-12 MED ORDER — EPHEDRINE SULFATE-NACL 50-0.9 MG/10ML-% IV SOSY
PREFILLED_SYRINGE | INTRAVENOUS | Status: DC | PRN
  Administered 2023-03-12: 2.5 mg via INTRAVENOUS
  Administered 2023-03-12: 5 mg via INTRAVENOUS
  Administered 2023-03-12 (×2): 2.5 mg via INTRAVENOUS

## 2023-03-12 MED ORDER — 0.9 % SODIUM CHLORIDE (POUR BTL) OPTIME
TOPICAL | Status: DC | PRN
  Administered 2023-03-12: 2000 mL

## 2023-03-12 MED ORDER — CHLORHEXIDINE GLUCONATE 0.12 % MT SOLN
15.0000 mL | Freq: Once | OROMUCOSAL | Status: AC
  Administered 2023-03-12: 15 mL via OROMUCOSAL
  Filled 2023-03-12: qty 15

## 2023-03-12 MED ORDER — PHENYLEPHRINE HCL-NACL 20-0.9 MG/250ML-% IV SOLN
INTRAVENOUS | Status: DC | PRN
  Administered 2023-03-12: 40 ug/min via INTRAVENOUS

## 2023-03-12 MED ORDER — SUGAMMADEX SODIUM 200 MG/2ML IV SOLN
INTRAVENOUS | Status: DC | PRN
  Administered 2023-03-12: 200 mg via INTRAVENOUS

## 2023-03-12 MED ORDER — PANTOPRAZOLE SODIUM 40 MG PO TBEC
40.0000 mg | DELAYED_RELEASE_TABLET | Freq: Every day | ORAL | Status: DC
  Administered 2023-03-13: 40 mg via ORAL
  Filled 2023-03-12: qty 1

## 2023-03-12 MED ORDER — CEFAZOLIN SODIUM-DEXTROSE 2-4 GM/100ML-% IV SOLN
2.0000 g | INTRAVENOUS | Status: AC
  Administered 2023-03-12: 2 g via INTRAVENOUS
  Filled 2023-03-12: qty 100

## 2023-03-12 MED ORDER — PHENOL 1.4 % MT LIQD: 1.0000 | OROMUCOSAL | Status: DC | PRN

## 2023-03-12 MED ORDER — ROCURONIUM BROMIDE 10 MG/ML (PF) SYRINGE
PREFILLED_SYRINGE | INTRAVENOUS | Status: DC | PRN
  Administered 2023-03-12: 20 mg via INTRAVENOUS
  Administered 2023-03-12: 50 mg via INTRAVENOUS
  Administered 2023-03-12: 20 mg via INTRAVENOUS

## 2023-03-12 MED ORDER — FENTANYL CITRATE (PF) 100 MCG/2ML IJ SOLN
INTRAMUSCULAR | Status: AC
  Filled 2023-03-12: qty 2

## 2023-03-12 MED ORDER — ACETAMINOPHEN 325 MG RE SUPP: 325.0000 mg | RECTAL | Status: DC | PRN

## 2023-03-12 MED ORDER — IPRATROPIUM-ALBUTEROL 0.5-2.5 (3) MG/3ML IN SOLN
3.0000 mL | Freq: Once | RESPIRATORY_TRACT | Status: AC
  Administered 2023-03-12: 3 mL via RESPIRATORY_TRACT

## 2023-03-12 MED ORDER — ORAL CARE MOUTH RINSE: 15.0000 mL | Freq: Once | OROMUCOSAL | Status: AC

## 2023-03-12 MED ORDER — MIDAZOLAM HCL 2 MG/2ML IJ SOLN
INTRAMUSCULAR | Status: DC | PRN
  Administered 2023-03-12: 2 mg via INTRAVENOUS

## 2023-03-12 MED ORDER — DEXAMETHASONE SODIUM PHOSPHATE 10 MG/ML IJ SOLN
INTRAMUSCULAR | Status: DC | PRN
  Administered 2023-03-12: 4 mg via INTRAVENOUS

## 2023-03-12 MED ORDER — GUAIFENESIN-DM 100-10 MG/5ML PO SYRP: 15.0000 mL | ORAL_SOLUTION | ORAL | Status: DC | PRN

## 2023-03-12 MED ORDER — ONDANSETRON HCL 4 MG/2ML IJ SOLN: 4.0000 mg | Freq: Four times a day (QID) | INTRAMUSCULAR | Status: DC | PRN

## 2023-03-12 MED ORDER — ACETAMINOPHEN 325 MG PO TABS: 325.0000 mg | ORAL_TABLET | ORAL | Status: DC | PRN

## 2023-03-12 MED ORDER — PROPOFOL 10 MG/ML IV BOLUS
INTRAVENOUS | Status: AC
  Filled 2023-03-12: qty 20

## 2023-03-12 MED ORDER — HEMOSTATIC AGENTS (NO CHARGE) OPTIME
TOPICAL | Status: DC | PRN
  Administered 2023-03-12: 1 via TOPICAL

## 2023-03-12 MED ORDER — DEXAMETHASONE SODIUM PHOSPHATE 10 MG/ML IJ SOLN
INTRAMUSCULAR | Status: AC
  Filled 2023-03-12: qty 1

## 2023-03-12 MED ORDER — HYDROMORPHONE HCL 1 MG/ML IJ SOLN
INTRAMUSCULAR | Status: AC
  Filled 2023-03-12: qty 1

## 2023-03-12 MED ORDER — ACETAMINOPHEN 500 MG PO TABS
1000.0000 mg | ORAL_TABLET | Freq: Once | ORAL | Status: AC
  Administered 2023-03-12: 1000 mg via ORAL
  Filled 2023-03-12: qty 2

## 2023-03-12 MED ORDER — HYDRALAZINE HCL 20 MG/ML IJ SOLN: 5.0000 mg | INTRAMUSCULAR | Status: DC | PRN

## 2023-03-12 MED ORDER — SODIUM CHLORIDE 0.9 % IV SOLN: INTRAVENOUS | Status: DC

## 2023-03-12 MED ORDER — ROCURONIUM BROMIDE 10 MG/ML (PF) SYRINGE
PREFILLED_SYRINGE | INTRAVENOUS | Status: AC
  Filled 2023-03-12: qty 10

## 2023-03-12 MED ORDER — ESMOLOL HCL 100 MG/10ML IV SOLN
INTRAVENOUS | Status: DC | PRN
  Administered 2023-03-12 (×2): 20 mg via INTRAVENOUS
  Administered 2023-03-12: 30 mg via INTRAVENOUS

## 2023-03-12 MED ORDER — ASPIRIN 81 MG PO TBEC
81.0000 mg | DELAYED_RELEASE_TABLET | Freq: Every day | ORAL | Status: DC
  Administered 2023-03-13: 81 mg via ORAL
  Filled 2023-03-12: qty 1

## 2023-03-12 MED ORDER — LABETALOL HCL 5 MG/ML IV SOLN: 10.0000 mg | INTRAVENOUS | Status: DC | PRN

## 2023-03-12 MED ORDER — HYDROMORPHONE HCL 1 MG/ML IJ SOLN: 0.5000 mg | INTRAMUSCULAR | Status: DC | PRN

## 2023-03-12 MED ORDER — HYDROMORPHONE HCL 1 MG/ML IJ SOLN
0.2500 mg | INTRAMUSCULAR | Status: DC | PRN
  Administered 2023-03-12 (×4): 0.5 mg via INTRAVENOUS

## 2023-03-12 MED ORDER — METOPROLOL TARTRATE 12.5 MG HALF TABLET
12.5000 mg | ORAL_TABLET | Freq: Two times a day (BID) | ORAL | Status: DC
  Administered 2023-03-12 – 2023-03-13 (×2): 12.5 mg via ORAL
  Filled 2023-03-12 (×2): qty 1

## 2023-03-12 MED ORDER — ROSUVASTATIN CALCIUM 20 MG PO TABS
40.0000 mg | ORAL_TABLET | Freq: Every morning | ORAL | Status: DC
  Administered 2023-03-13: 40 mg via ORAL
  Filled 2023-03-12: qty 2

## 2023-03-12 MED ORDER — LIDOCAINE 2% (20 MG/ML) 5 ML SYRINGE
INTRAMUSCULAR | Status: AC
  Filled 2023-03-12: qty 5

## 2023-03-12 MED ORDER — OXYCODONE-ACETAMINOPHEN 5-325 MG PO TABS
ORAL_TABLET | ORAL | Status: AC
  Filled 2023-03-12: qty 2

## 2023-03-12 MED ORDER — ONDANSETRON HCL 4 MG/2ML IJ SOLN
INTRAMUSCULAR | Status: AC
  Filled 2023-03-12: qty 2

## 2023-03-12 MED ORDER — MIDAZOLAM HCL 2 MG/2ML IJ SOLN
INTRAMUSCULAR | Status: AC
  Filled 2023-03-12: qty 2

## 2023-03-12 MED ORDER — METOPROLOL TARTRATE 5 MG/5ML IV SOLN: 2.0000 mg | INTRAVENOUS | Status: DC | PRN

## 2023-03-12 MED ORDER — ONDANSETRON HCL 4 MG/2ML IJ SOLN
INTRAMUSCULAR | Status: DC | PRN
  Administered 2023-03-12: 4 mg via INTRAVENOUS

## 2023-03-12 MED ORDER — DOCUSATE SODIUM 100 MG PO CAPS
100.0000 mg | ORAL_CAPSULE | Freq: Every day | ORAL | Status: DC
  Administered 2023-03-13: 100 mg via ORAL
  Filled 2023-03-12: qty 1

## 2023-03-12 MED ORDER — SENNOSIDES-DOCUSATE SODIUM 8.6-50 MG PO TABS: 1.0000 | ORAL_TABLET | Freq: Every evening | ORAL | Status: DC | PRN

## 2023-03-12 MED ORDER — HEPARIN 6000 UNIT IRRIGATION SOLUTION
Status: AC
  Filled 2023-03-12: qty 500

## 2023-03-12 MED ORDER — ALUM & MAG HYDROXIDE-SIMETH 200-200-20 MG/5ML PO SUSP: 15.0000 mL | ORAL | Status: DC | PRN

## 2023-03-12 MED ORDER — PHENYLEPHRINE 80 MCG/ML (10ML) SYRINGE FOR IV PUSH (FOR BLOOD PRESSURE SUPPORT)
PREFILLED_SYRINGE | INTRAVENOUS | Status: DC | PRN
  Administered 2023-03-12 (×2): 80 ug via INTRAVENOUS
  Administered 2023-03-12 (×2): 40 ug via INTRAVENOUS

## 2023-03-12 MED ORDER — FENTANYL CITRATE (PF) 250 MCG/5ML IJ SOLN
INTRAMUSCULAR | Status: AC
  Filled 2023-03-12: qty 5

## 2023-03-12 MED ORDER — EZETIMIBE 10 MG PO TABS
10.0000 mg | ORAL_TABLET | Freq: Every day | ORAL | Status: DC
  Administered 2023-03-13: 10 mg via ORAL
  Filled 2023-03-12: qty 1

## 2023-03-12 MED ORDER — LIDOCAINE 2% (20 MG/ML) 5 ML SYRINGE
INTRAMUSCULAR | Status: DC | PRN
  Administered 2023-03-12: 100 mg via INTRAVENOUS

## 2023-03-12 MED ORDER — HEPARIN SODIUM (PORCINE) 1000 UNIT/ML IJ SOLN
INTRAMUSCULAR | Status: AC
  Filled 2023-03-12: qty 20

## 2023-03-12 MED ORDER — BISACODYL 10 MG RE SUPP: 10.0000 mg | Freq: Every day | RECTAL | Status: DC | PRN

## 2023-03-12 MED ORDER — CHLORHEXIDINE GLUCONATE CLOTH 2 % EX PADS: 6.0000 | MEDICATED_PAD | Freq: Once | CUTANEOUS | Status: DC

## 2023-03-12 MED ORDER — IPRATROPIUM-ALBUTEROL 0.5-2.5 (3) MG/3ML IN SOLN
RESPIRATORY_TRACT | Status: AC
  Filled 2023-03-12: qty 3

## 2023-03-12 MED ORDER — MAGNESIUM SULFATE 2 GM/50ML IV SOLN: 2.0000 g | Freq: Every day | INTRAVENOUS | Status: DC | PRN

## 2023-03-12 MED ORDER — IPRATROPIUM-ALBUTEROL 0.5-2.5 (3) MG/3ML IN SOLN: 3.0000 mL | RESPIRATORY_TRACT | Status: DC

## 2023-03-12 MED ORDER — HEPARIN 6000 UNIT IRRIGATION SOLUTION
Status: DC | PRN
  Administered 2023-03-12: 1

## 2023-03-12 MED ORDER — FENOFIBRATE 54 MG PO TABS
54.0000 mg | ORAL_TABLET | Freq: Every day | ORAL | Status: DC
  Administered 2023-03-13: 54 mg via ORAL
  Filled 2023-03-12: qty 1

## 2023-03-12 MED ORDER — PROTAMINE SULFATE 10 MG/ML IV SOLN
INTRAVENOUS | Status: DC | PRN
  Administered 2023-03-12: 50 mg via INTRAVENOUS

## 2023-03-12 MED ORDER — PROPOFOL 10 MG/ML IV BOLUS
INTRAVENOUS | Status: DC | PRN
  Administered 2023-03-12: 50 mg via INTRAVENOUS
  Administered 2023-03-12: 100 mg via INTRAVENOUS
  Administered 2023-03-12: 50 mg via INTRAVENOUS

## 2023-03-12 MED ORDER — EPHEDRINE 5 MG/ML INJ
INTRAVENOUS | Status: AC
  Filled 2023-03-12: qty 5

## 2023-03-12 MED ORDER — CEFAZOLIN SODIUM-DEXTROSE 2-4 GM/100ML-% IV SOLN
2.0000 g | Freq: Three times a day (TID) | INTRAVENOUS | Status: AC
  Administered 2023-03-12 (×2): 2 g via INTRAVENOUS
  Filled 2023-03-12 (×2): qty 100

## 2023-03-12 MED ORDER — AMIODARONE HCL 200 MG PO TABS
200.0000 mg | ORAL_TABLET | Freq: Every day | ORAL | Status: DC
  Administered 2023-03-13: 200 mg via ORAL
  Filled 2023-03-12: qty 1

## 2023-03-12 MED ORDER — IRBESARTAN 150 MG PO TABS
150.0000 mg | ORAL_TABLET | Freq: Every day | ORAL | Status: DC
  Administered 2023-03-13: 150 mg via ORAL
  Filled 2023-03-12: qty 1

## 2023-03-12 MED ORDER — FENTANYL CITRATE (PF) 100 MCG/2ML IJ SOLN
25.0000 ug | INTRAMUSCULAR | Status: DC | PRN
  Administered 2023-03-12: 50 ug via INTRAVENOUS
  Administered 2023-03-12 (×2): 25 ug via INTRAVENOUS
  Administered 2023-03-12: 50 ug via INTRAVENOUS

## 2023-03-12 MED ORDER — LEVOTHYROXINE SODIUM 125 MCG PO TABS
125.0000 ug | ORAL_TABLET | Freq: Every day | ORAL | Status: DC
  Administered 2023-03-13: 125 ug via ORAL
  Filled 2023-03-12: qty 1

## 2023-03-12 MED ORDER — SODIUM CHLORIDE 0.9 % IV SOLN: 500.0000 mL | Freq: Once | INTRAVENOUS | Status: DC | PRN

## 2023-03-12 MED ORDER — CLOPIDOGREL BISULFATE 75 MG PO TABS
75.0000 mg | ORAL_TABLET | Freq: Every day | ORAL | Status: DC
  Administered 2023-03-13: 75 mg via ORAL
  Filled 2023-03-12: qty 1

## 2023-03-12 MED ORDER — HEPARIN SODIUM (PORCINE) 5000 UNIT/ML IJ SOLN
5000.0000 [IU] | Freq: Three times a day (TID) | INTRAMUSCULAR | Status: DC
  Administered 2023-03-13: 5000 [IU] via SUBCUTANEOUS
  Filled 2023-03-12: qty 1

## 2023-03-12 MED ORDER — ESMOLOL HCL 100 MG/10ML IV SOLN
INTRAVENOUS | Status: AC
  Filled 2023-03-12: qty 10

## 2023-03-12 MED ORDER — PHENYLEPHRINE 80 MCG/ML (10ML) SYRINGE FOR IV PUSH (FOR BLOOD PRESSURE SUPPORT)
PREFILLED_SYRINGE | INTRAVENOUS | Status: AC
  Filled 2023-03-12: qty 10

## 2023-03-12 MED ORDER — LACTATED RINGERS IV SOLN: INTRAVENOUS | Status: DC

## 2023-03-12 MED ORDER — OXYCODONE-ACETAMINOPHEN 5-325 MG PO TABS
1.0000 | ORAL_TABLET | ORAL | Status: DC | PRN
  Administered 2023-03-12 – 2023-03-13 (×5): 2 via ORAL
  Filled 2023-03-12 (×4): qty 2

## 2023-03-12 MED ORDER — HEPARIN SODIUM (PORCINE) 1000 UNIT/ML IJ SOLN
INTRAMUSCULAR | Status: DC | PRN
  Administered 2023-03-12: 10000 [IU] via INTRAVENOUS
  Administered 2023-03-12: 3000 [IU] via INTRAVENOUS

## 2023-03-12 MED ORDER — FENTANYL CITRATE (PF) 250 MCG/5ML IJ SOLN
INTRAMUSCULAR | Status: DC | PRN
  Administered 2023-03-12 (×2): 50 ug via INTRAVENOUS
  Administered 2023-03-12: 75 ug via INTRAVENOUS
  Administered 2023-03-12: 50 ug via INTRAVENOUS
  Administered 2023-03-12: 100 ug via INTRAVENOUS
  Administered 2023-03-12: 50 ug via INTRAVENOUS
  Administered 2023-03-12: 75 ug via INTRAVENOUS

## 2023-03-12 MED ORDER — POTASSIUM CHLORIDE CRYS ER 20 MEQ PO TBCR: 20.0000 meq | EXTENDED_RELEASE_TABLET | Freq: Every day | ORAL | Status: DC | PRN

## 2023-03-12 SURGICAL SUPPLY — 42 items
ADH SKN CLS APL DERMABOND .7 (GAUZE/BANDAGES/DRESSINGS) ×1
AGENT HMST SPONGE THK3/8 (HEMOSTASIS)
BAG COUNTER SPONGE SURGICOUNT (BAG) ×1 IMPLANT
BAG SPNG CNTER NS LX DISP (BAG) ×1
CANISTER SUCT 3000ML PPV (MISCELLANEOUS) ×1 IMPLANT
CLIP TI MEDIUM 24 (CLIP) ×1 IMPLANT
CLIP TI WIDE RED SMALL 24 (CLIP) ×1 IMPLANT
DERMABOND ADVANCED .7 DNX12 (GAUZE/BANDAGES/DRESSINGS) ×1 IMPLANT
DRAIN CHANNEL 15F RND FF W/TCR (WOUND CARE) IMPLANT
ELECT REM PT RETURN 9FT ADLT (ELECTROSURGICAL) ×1
ELECTRODE REM PT RTRN 9FT ADLT (ELECTROSURGICAL) ×1 IMPLANT
EVACUATOR SILICONE 100CC (DRAIN) IMPLANT
GLOVE BIO SURGEON STRL SZ 6.5 (GLOVE) IMPLANT
GLOVE BIO SURGEON STRL SZ7 (GLOVE) IMPLANT
GLOVE BIO SURGEON STRL SZ7.5 (GLOVE) ×1 IMPLANT
GLOVE BIOGEL PI IND STRL 7.0 (GLOVE) IMPLANT
GLOVE BIOGEL PI IND STRL 8 (GLOVE) ×1 IMPLANT
GOWN STRL REUS W/ TWL LRG LVL3 (GOWN DISPOSABLE) ×3 IMPLANT
GOWN STRL REUS W/ TWL XL LVL3 (GOWN DISPOSABLE) ×2 IMPLANT
GOWN STRL REUS W/TWL LRG LVL3 (GOWN DISPOSABLE) ×3
GOWN STRL REUS W/TWL XL LVL3 (GOWN DISPOSABLE) ×2
HEMOSTAT SNOW SURGICEL 2X4 (HEMOSTASIS) IMPLANT
HEMOSTAT SPONGE AVITENE ULTRA (HEMOSTASIS) IMPLANT
KIT BASIN OR (CUSTOM PROCEDURE TRAY) ×1 IMPLANT
KIT TURNOVER KIT B (KITS) ×1 IMPLANT
NS IRRIG 1000ML POUR BTL (IV SOLUTION) ×2 IMPLANT
PACK PERIPHERAL VASCULAR (CUSTOM PROCEDURE TRAY) ×1 IMPLANT
PAD ARMBOARD 7.5X6 YLW CONV (MISCELLANEOUS) ×2 IMPLANT
PATCH VASC XENOSURE 1CMX6CM (Vascular Products) ×1 IMPLANT
PATCH VASC XENOSURE 1X6 (Vascular Products) IMPLANT
SET WALTER ACTIVATION W/DRAPE (SET/KITS/TRAYS/PACK) IMPLANT
SUT MNCRL AB 4-0 PS2 18 (SUTURE) ×1 IMPLANT
SUT PROLENE 5 0 C 1 24 (SUTURE) ×1 IMPLANT
SUT PROLENE 6 0 BV (SUTURE) IMPLANT
SUT VIC AB 2-0 CT1 27 (SUTURE) ×1
SUT VIC AB 2-0 CT1 TAPERPNT 27 (SUTURE) ×1 IMPLANT
SUT VIC AB 3-0 SH 27 (SUTURE) ×1
SUT VIC AB 3-0 SH 27X BRD (SUTURE) ×1 IMPLANT
TOWEL GREEN STERILE (TOWEL DISPOSABLE) ×1 IMPLANT
TRAY FOLEY MTR SLVR 16FR STAT (SET/KITS/TRAYS/PACK) IMPLANT
UNDERPAD 30X36 HEAVY ABSORB (UNDERPADS AND DIAPERS) ×1 IMPLANT
WATER STERILE IRR 1000ML POUR (IV SOLUTION) ×1 IMPLANT

## 2023-03-12 NOTE — Telephone Encounter (Signed)
LVM to schedule post op appt. MD is Dr. Chestine Spore- mistakenly put Dr. Randie Heinz

## 2023-03-12 NOTE — Progress Notes (Signed)
  Day of Surgery Note    Subjective:  no complaints   Vitals:   03/12/23 1215 03/12/23 1230  BP: (!) 143/84 (!) 145/97  Pulse: 68 71  Resp: 13 16  Temp:    SpO2: 97% 100%    Incisions:   left groin incision is clean and dry Extremities:  + doppler flow left DP>PT; brisk doppler flow left CFA Cardiac:  regular Lungs:  non labored    Assessment/Plan:  This is a 67 y.o. male who is s/p  Left iliofemoral endarterectomy with bovine pericardial patch angioplasty for disabling claudication on 03/12/2023 by Dr. Chestine Spore  -pt with brisk doppler flow left foot.  -transfer to 4 east later today -possibly home tomorrow if does well overnight but may need a couple of days.   -asa/statin/plavix   Doreatha Massed, PA-C 03/12/2023 12:41 PM [phone]

## 2023-03-12 NOTE — Progress Notes (Signed)
Pt admitted to rm 14 from PACU. Initiated tele. Oriented pt to the unit. VSS. Call bell within reach.   Lawson Radar, RN

## 2023-03-12 NOTE — Telephone Encounter (Signed)
-----   Message from Dara Lords, [place] sent at 03/12/2023 12:44 PM EDT ----- S/p Left iliofemoral endarterectomy with bovine pericardial patch angioplasty 6/21.  Please have pt f/u in 2-3 weeks for incision check on Dr. Chestine Spore clinic day.  Thanks

## 2023-03-12 NOTE — Transfer of Care (Signed)
Immediate Anesthesia Transfer of Care Note  Patient: Carl [name]  Procedure(s) Performed: LEFT ENDARTERECTOMYILIOFEMORAL (Left: Groin) PATCH ANGIOPLASTY USING XENOSURE PATCH 1CMX6CM (Left: Groin)  Patient Location: PACU  Anesthesia Type:General  Level of Consciousness: awake, alert , oriented, and patient cooperative  Airway & Oxygen Therapy: Patient Spontanous Breathing  Post-op Assessment: Report given to RN and Post -op Vital signs reviewed and stable  Post vital signs: Reviewed and stable  Last Vitals:  Vitals Value Taken Time  BP 144/105 03/12/23 1031  Temp 36.8 C 03/12/23 1030  Pulse 83 03/12/23 1032  Resp 15 03/12/23 1032  SpO2 96 % 03/12/23 1032  Vitals shown include unvalidated device data.  Last Pain:  Vitals:   03/12/23 0624  TempSrc:   PainSc: 4       Patients Stated Pain Goal: 3 (03/12/23 4098)  Complications: No notable events documented.

## 2023-03-12 NOTE — Anesthesia Procedure Notes (Signed)
Arterial Line Insertion Start/End6/21/2024 7:00 AM Performed by: Drema Pry, CRNA, CRNA  Patient location: OR. Preanesthetic checklist: patient identified, IV checked, site marked, surgical consent, monitors and equipment checked and pre-op evaluation Lidocaine 1% used for infiltration Left, radial was placed Catheter size: 20 G Hand hygiene performed  and maximum sterile barriers used   Attempts: 1 Procedure performed without using ultrasound guided technique. Following insertion, dressing applied and Biopatch. Post procedure assessment: normal  Patient tolerated the procedure well with no immediate complications.

## 2023-03-12 NOTE — Anesthesia Procedure Notes (Signed)
Procedure Name: Intubation Date/Time: 03/12/2023 7:44 AM  Performed by: Drema Pry, CRNAPre-anesthesia Checklist: Patient identified, Emergency Drugs available, Suction available and Patient being monitored Patient Re-evaluated:Patient Re-evaluated prior to induction Oxygen Delivery Method: Circle System Utilized Preoxygenation: Pre-oxygenation with 100% oxygen Induction Type: IV induction Ventilation: Mask ventilation with difficulty, Two handed mask ventilation required and Oral airway inserted - appropriate to patient size Laryngoscope Size: Mac and 4 Grade View: Grade I Tube type: Oral Tube size: 7.5 mm Number of attempts: 1 Airway Equipment and Method: Stylet and Oral airway Placement Confirmation: ETT inserted through vocal cords under direct vision, positive ETCO2 and breath sounds checked- equal and bilateral Secured at: 22 cm Tube secured with: Tape Dental Injury: Teeth and Oropharynx as per pre-operative assessment  Comments: Difficult mask due to patient's beard and lack of dentition - minimal gas exchange with 2 hand mask and oral airway

## 2023-03-12 NOTE — Discharge Instructions (Addendum)
 [facility]  Discharge instructions  Lower Extremity Surgery  Please refer to the following instruction for your post-procedure care. Your surgeon or physician assistant will discuss any changes with you.  Activity  You are encouraged to walk as much as you can. You can slowly return to normal activities during the month after your surgery. Avoid strenuous activity and heavy lifting until your doctor tells you it's OK. Avoid activities such as vacuuming or swinging a golf club. Do not drive until your doctor give the OK and you are no longer taking prescription pain medications. It is also normal to have difficulty with sleep habits, eating and bowel movement after surgery. These will go away with time.  Bathing/Showering  Shower daily after you go home. Do not soak in a bathtub, hot tub, or swim until the incision heals completely.  Incision Care  Clean your incision with mild soap and water. Shower every day. Pat the area dry with a clean towel. You do not need a bandage unless otherwise instructed. Do not apply any ointments or creams to your incision. If you have open wounds you will be instructed how to care for them or a visiting nurse may be arranged for you. If you have staples or sutures along your incision they will be removed at your post-op appointment. You may have skin glue on your incision. Do not peel it off. It will come off on its own in about one week.  Wash the groin wound with soap and water daily and pat dry. (No tub bath-only shower)  Then put a dry gauze or washcloth in the groin to keep this area dry to help prevent wound infection.  Do this daily and as needed.  Do not use Vaseline or neosporin on your incisions.  Only use soap and water on your incisions and then protect and keep dry.  Diet  Resume your normal diet. There are no special food restrictions following this procedure. A low fat/ low cholesterol diet is recommended for  all patients with vascular disease. In order to heal from your surgery, it is CRITICAL to get adequate nutrition. Your body requires vitamins, minerals, and protein. Vegetables are the best source of vitamins and minerals. Vegetables also provide the perfect balance of protein. Processed food has little nutritional value, so try to avoid this.  Medications  Resume taking all your medications unless your doctor or physician assistant tells you not to. If your incision is causing pain, you may take over-the-counter pain relievers such as acetaminophen (Tylenol). If you were prescribed a stronger pain medication, please aware these medication can cause nausea and constipation. Prevent nausea by taking the medication with a snack or meal. Avoid constipation by drinking plenty of fluids and eating foods with high amount of fiber, such as fruits, vegetables, and grains. Take Colace 100 mg (an over-the-counter stool softener) twice a day as needed for constipation.  Do not take Tylenol if you are taking prescription pain medications.  Follow Up  Our office will schedule a follow up appointment 2-3 weeks following discharge.  Please call us immediately for any of the following conditions  Severe or worsening pain in your legs or feet while at rest or while walking Increase pain, redness, warmth, or drainage (pus) from your incision site(s) Fever of 101 degree or higher The swelling in your leg with the bypass suddenly worsens and becomes more painful than when you were in the hospital If you have been   instructed to feel your graft pulse then you should do so every day. If you can no longer feel this pulse, call the office immediately. Not all patients are given this instruction.  Leg swelling is common after leg bypass surgery.  The swelling should improve over a few months following surgery. To improve the swelling, you may elevate your legs above the level of your heart while you are sitting or  resting. Your surgeon or physician assistant may ask you to apply an ACE wrap or wear compression (TED) stockings to help to reduce swelling.  Reduce your risk of vascular disease  Stop smoking. If you would like help call [facility] at 1-800-QUIT-NOW (1-800-784-8669) or [place] at 336-586-4000.  Manage your cholesterol Maintain a desired weight Control your diabetes weight Control your diabetes Keep your blood pressure down  If you have any questions, please call the office at [phone] 

## 2023-03-12 NOTE — Op Note (Signed)
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 Cephus Shelling, MD  Assistant: Nathanial Rancher, PA  Indications: 67 year old male seen with disabling lower extremity claudication.  He underwent an angiogram with Dr. Allyson Sabal showing bilateral common femoral artery disease.  The left leg was more symptomatic.  He had two focal high-grade stenosis in the distal external iliac and proximal common femoral artery.  He presents today for left common femoral endarterectomy after risk benefits discussed.  An assistant was needed for exposure including the endarterectomy and sewing the patch.  Findings: The left common femoral artery was dissected out through a horizontal groin incision above the inguinal crease.  There was no femoral pulse prior to endarterectomy.  Once the SFA and profunda were controlled, I went under the inguinal ligament to get a soft spot for clamping on the proximal external iliac artery and had to use the [facility] robotic retractor arm to get adequate exposure.  Extensive endarterectomy was performed of the distal external iliac and common femoral artery.  I stopped the endarterectomy short of the profunda and tacked down the plaque.  I did open the arteriotomy onto the proximal SFA.  Bovine pericardial patch was sewn.  Palpable femoral pulse at completion.  Brisk Doppler signals in the left foot.  Anesthesia: General  Details: Patient was taken to the operating room after informed consent was obtained.  Placed on operative table in the supine position.  General endotracheal anesthesia was induced.  I made a horizontal groin incision in the left groin above the inguinal crease.  Dissected down with Bovie cautery and used  cerebellum retractors for added visualization.  The femoral sheath was opened longitudinally.  I then used Bovie cautery and dissected out the common femoral as well as SFA and profunda.  The SFA and profunda were controlled with Vesseloops.  We dissected under the inguinal ligament with a hand-held retractor and given the proximal extent of the disease we used the robotic arm with the deaver retractor.  Circumflex veins were ligated between 2-0 silk ties and divided and we found a soft spot for clamping on the proximal external iliac artery.  We gave 100 units/kg IV heparin.  Once the circulated checked an ACT to maintain greater than 250.  The SFA and profunda were controlled with Vesseloops and I controlled the proximal external iliac with a Henley clamp.  The left common femoral artery was opened with 11 blade scalpel extended with Potts scissors under the inguinal crease.  Extensive endarterectomy was performed with Larita Fife and I endarterectomized the distal external iliac artery and common femoral and carried this all the way down to the takeoff of the profunda where I transected the plaque.  The plaque was then tacked down with multiple 6-0 Prolene's.  I then opened the arteriotomy onto the proximal SFA where I could visualize the true lumen.  A bovine pericardial patch was brought on the field and this was sewn in place with 5-0 Prolene parachute technique with the help of my assistant.  I did de-air the artery prior to completion.  Once we came off clamps I did put some additional repair sutures in the patch.  There was a palpable femoral pulse.  There was brisk doppler signals in the foot.  Protamine was given for reversal.  We irrigated  out the groin and closed this in multiple layers of 2-0 Vicryl, 3-0 Vicryl, 4-0 Monocryl and Dermabond.  Complication: None  Condition: Stable  Cephus Shelling, MD [facility] Office: [phone]   Cephus Shelling
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
## 2023-03-13 LAB — BASIC METABOLIC PANEL
Anion gap: 10 (ref 5–15)
BUN: 16 mg/dL (ref 8–23)
CO2: 22 mmol/L (ref 22–32)
Calcium: 9.1 mg/dL (ref 8.9–10.3)
Chloride: 98 mmol/L (ref 98–111)
Creatinine, Ser: 1.39 mg/dL — ABNORMAL HIGH (ref 0.61–1.24)
GFR, Estimated: 56 mL/min — ABNORMAL LOW (ref >60)
Glucose, Bld: 108 mg/dL — ABNORMAL HIGH (ref 70–99)
Potassium: 3.9 mmol/L (ref 3.5–5.1)
Sodium: 130 mmol/L — ABNORMAL LOW (ref 135–145)

## 2023-03-13 LAB — CBC
HCT: 33.4 % — ABNORMAL LOW (ref 39.0–52.0)
Hemoglobin: 11.5 g/dL — ABNORMAL LOW (ref 13.0–17.0)
MCH: 31 pg (ref 26.0–34.0)
MCHC: 34.4 g/dL (ref 30.0–36.0)
MCV: 90 fL (ref 80.0–100.0)
Platelets: 147 10*3/uL — ABNORMAL LOW (ref 150–400)
RBC: 3.71 MIL/uL — ABNORMAL LOW (ref 4.22–5.81)
RDW: 12.5 % (ref 11.5–15.5)
WBC: 6.7 10*3/uL (ref 4.0–10.5)
nRBC: 0 % (ref 0.0–0.2)

## 2023-03-13 LAB — LIPID PANEL
Cholesterol: 100 mg/dL (ref 0–200)
HDL: 30 mg/dL — ABNORMAL LOW (ref >40)
LDL Cholesterol: 35 mg/dL (ref 0–99)
Total CHOL/HDL Ratio: 3.3 RATIO
Triglycerides: 175 mg/dL — ABNORMAL HIGH (ref <150)
VLDL: 35 mg/dL (ref 0–40)

## 2023-03-13 NOTE — Progress Notes (Addendum)
  Progress Note    03/13/2023 6:52 AM 1 Day Post-Op  Subjective:  says he feels sore this morning.  Has not walked yet or been out of bed.  Would like to go home today.  Has been able to void.  Afebrile HR 60's-90's NSR 130's-150's systolic 97% RA  Vitals:   03/13/23 0222 03/13/23 0540  BP: (!) 146/65 135/80  Pulse: 70 63  Resp: 17 19  Temp: 98.3 F (36.8 C) 98.5 F (36.9 C)  SpO2: 94% 94%    Physical Exam: General:  no distress Cardiac:  regular Lungs:  non labored Incisions:  left groin incision looks good Extremities:  brisk left DP>PT (unchanged from recovery room)   CBC    Component Value Date/Time   WBC 6.7 03/13/2023 0400   RBC 3.71 (L) 03/13/2023 0400   HGB 11.5 (L) 03/13/2023 0400   HGB 14.1 12/23/2022 0905   HCT 33.4 (L) 03/13/2023 0400   HCT 42.9 12/23/2022 0905   PLT 147 (L) 03/13/2023 0400   PLT 211 12/23/2022 0905   MCV 90.0 03/13/2023 0400   MCV 88 12/23/2022 0905   MCH 31.0 03/13/2023 0400   MCHC 34.4 03/13/2023 0400   RDW 12.5 03/13/2023 0400   RDW 13.7 12/23/2022 0905   LYMPHSABS 1.9 01/22/2020 1344   MONOABS 0.5 01/22/2020 1344   EOSABS 0.2 01/22/2020 1344   BASOSABS 0.1 01/22/2020 1344    BMET    Component Value Date/Time   NA 130 (L) 03/13/2023 0400   NA 136 12/23/2022 0905   K 3.9 03/13/2023 0400   CL 98 03/13/2023 0400   CO2 22 03/13/2023 0400   GLUCOSE 108 (H) 03/13/2023 0400   BUN 16 03/13/2023 0400   BUN 14 12/23/2022 0905   CREATININE 1.39 (H) 03/13/2023 0400   CALCIUM 9.1 03/13/2023 0400   GFRNONAA 56 (L) 03/13/2023 0400   GFRAA >60 01/22/2020 1232    INR    Component Value Date/Time   INR 1.1 03/03/2023 1020     Intake/Output Summary (Last 24 hours) at 03/13/2023 4098 Last data filed at 03/13/2023 0600 Gross per 24 hour  Intake 2040 ml  Output 1005 ml  Net 1035 ml      Assessment/Plan:  67 y.o. male is s/p:  Left iliofemoral endarterectomy with bovine pericardial patch angioplasty for disabling  claudication on 03/12/2023 by Dr. Chestine Spore   1 Day Post-Op   -pt continues have brisk doppler flow to left DP>PT. -pt needs to walk in hallways -he does get hydrocodone and therefore will not prescribe pain med at discharge.  PDMP reviewed. -discussed groin wound care with pt.   -DVT prophylaxis:  sq heparin -continue asa/statin/plavix-has been off plavix since 6/13.  Will restart this. -plan for discharge later today if he is able to walk in hallway without difficulty.   -f/u in 2-3 weeks on Dr. Chestine Spore clinic day for incision check. -fortunately, he quit smoking in November.    Doreatha Massed, PA-C [facility] [phone] 03/13/2023 6:52 AM  I have interviewed the patient and examined the patient. I agree with the findings by the PA.  Brisk anterior tibial and dorsalis pedis signal with Doppler.  His incision looks fine.  Anticipate discharge today.  Cari Caraway, MD

## 2023-03-13 NOTE — Progress Notes (Addendum)
Received referral to assist pt with a RW. Met with pt and son. He plans to return home with the support of his son. He agrees with the RW. He doesn't have a preference for a DME agency. Discussed [facility] for DME referral and he agreed with the agency. He reports that his PCP retired and he got a new one and he can't remember her name. He denies any issues filling his prescriptions. He reports that he was driving before the surgery and he has been instructed not to drive for a couple of weeks. Contacted Jasmine with [facility] for referral. She accepted the referral.   Received message from [place]. She reports that pt received a RW on 01/24/20. Pt will have to pay out-of-pocket if he gets a new RW. Met with pt and son. He reports that his other son is using the RW but he has more walkers at home.

## 2023-03-13 NOTE — Progress Notes (Signed)
Order to discharge pt home.  Discharge instructions/AVS given to patient and reviewed - education provided as needed.  Pt advised to call PCP and/or come back to the hospital if there are any problems. Pt verbalized understanding.    

## 2023-03-13 NOTE — Progress Notes (Signed)
Pt order to d/c home - pt will need rolling walker delivered to room prior to discharge.  Case Management aware.

## 2023-03-13 NOTE — Evaluation (Signed)
Occupational Therapy Evaluation Patient Details Name: Carl [name] MRN: [id number] DOB: [birth year] Today's Date: 03/13/2023   History of Present Illness 67 y.o. male is s/p Left iliofemoral endarterectomy with bovine pericardial patch angioplasty for disabling claudication on 03/12/2023 by Dr. Chestine Spore. PMH: CAD status post CABG in 2023, hypertension, hyperlipidemia, tobacco abuse PSH: h/o of 3 back surgeries by Dr. Jordan Likes.   Clinical Impression   Pt admitted for above dx, PTA patient was fully ind in bADLs and mobility, has a hx of back surgeries. Pt currently supervision for bADLs and mobility but presents with decreased hip flexion which limits LBD. Educated pt on use of sockaid and reacher to complete LBD, which he can now perform with Supervision.Marland Kitchen Pt presenting at or close to baseline, will continue to follow pt acutely to progress with functional deficits prn. No follow-up OT recommended at this time.      Recommendations for follow up therapy are one component of a multi-disciplinary discharge planning process, led by the attending physician.  Recommendations may be updated based on patient status, additional functional criteria and insurance authorization.   Assistance Recommended at Discharge PRN  Patient can return home with the following Assistance with cooking/housework    Functional Status Assessment  Patient has had a recent decline in their functional status and demonstrates the ability to make significant improvements in function in a reasonable and predictable amount of time.  Equipment Recommendations  None recommended by OT (Pt has rec DME needs)    Recommendations for Other Services       Precautions / Restrictions Precautions Precautions: Fall Restrictions Weight Bearing Restrictions: No      Mobility Bed Mobility Overal bed mobility: Modified Independent                  Transfers Overall transfer level: Needs assistance Equipment used:  None Transfers: Sit to/from Stand Sit to Stand: Supervision                  Balance Overall balance assessment: Mild deficits observed, not formally tested                                         ADL either performed or assessed with clinical judgement   ADL                                         General ADL Comments: Pt completing bADLs and room level mobility with supervision. Educated pt on the use of reacher and sockaid to assist with LBD due to impaired hip flexion, pt return demonstrated understanding     Vision         Perception     Praxis      Pertinent Vitals/Pain Pain Assessment Pain Assessment: 0-10 Pain Score: 3  Pain Location: L LE, mostly groin Pain Descriptors / Indicators: Sore Pain Intervention(s): Monitored during session, Repositioned     Hand Dominance Right   Extremity/Trunk Assessment Upper Extremity Assessment Upper Extremity Assessment: Overall WFL for tasks assessed   Lower Extremity Assessment Lower Extremity Assessment: LLE deficits/detail LLE Deficits / Details: ROM limited at hip due to surgical entrance site   Cervical / Trunk Assessment Cervical / Trunk Assessment: Normal (hx of back surgery)   Communication Communication Communication: No difficulties   Cognition  Arousal/Alertness: Awake/alert Behavior During Therapy: WFL for tasks assessed/performed Overall Cognitive Status: Within Functional Limits for tasks assessed                                       General Comments  VSS On RA    Exercises     Shoulder Instructions      Home Living Family/patient expects to be discharged to:: Private residence Living Arrangements: Alone (going to stay with son at [place]) Available Help at Discharge: Family;Available 24 hours/day Type of Home: House Home Access: Stairs to enter [employer] of Steps: 2 Entrance Stairs-Rails: Left Home Layout: One level      Bathroom Shower/Tub: Producer, television/film/video: Handicapped height Bathroom Accessibility: Yes   Home Equipment: Cane - single point;Shower seat;Hand held shower head   Additional Comments: pt enjoys fishing and hunting      Prior Functioning/Environment Prior Level of Function : Independent/Modified Independent             Mobility Comments: indep ADLs Comments: indep        OT Problem List: Decreased activity tolerance;Impaired balance (sitting and/or standing)      OT Treatment/Interventions: Self-care/ADL training;DME and/or AE instruction;Therapeutic exercise;Patient/family education;Therapeutic activities;Balance training    OT Goals(Current goals can be found in the care plan section) Acute Rehab OT Goals Patient Stated Goal: To go home OT Goal Formulation: With patient Time For Goal Achievement: 03/26/23 Potential to Achieve Goals: Good ADL Goals Pt Will Perform Grooming: standing;with modified independence Pt Will Perform Lower Body Bathing: with modified independence;sit to/from stand Pt Will Perform Lower Body Dressing: with adaptive equipment;with modified independence;sitting/lateral leans  OT Frequency:      Co-evaluation              AM-PAC OT "6 Clicks" Daily Activity     Outcome Measure Help from another person eating meals?: None Help from another person taking care of personal grooming?: A Little Help from another person toileting, which includes using toliet, bedpan, or urinal?: A Little Help from another person bathing (including washing, rinsing, drying)?: A Little Help from another person to put on and taking off regular upper body clothing?: None Help from another person to put on and taking off regular lower body clothing?: A Little 6 Click Score: 20   End of Session Equipment Utilized During Treatment: Gait belt Nurse Communication: Mobility status  Activity Tolerance: Patient tolerated treatment well Patient left: in  bed;with call bell/phone within reach;with family/visitor present  OT Visit Diagnosis: Unsteadiness on feet (R26.81)                Time: 1610-9604 OT Time Calculation (min): 14 min Charges:  OT General Charges $OT Visit: 1 Visit OT Evaluation $OT Eval Low Complexity: 1 Low  03/13/2023  AB, OTR/L  Acute Rehabilitation Services  Office: [phone]   Tristan Schroeder 03/13/2023, 10:11 AM

## 2023-03-13 NOTE — Plan of Care (Signed)
  Problem: Education: Goal: Knowledge of prescribed regimen will improve Outcome: Adequate for Discharge   Problem: Activity: Goal: Ability to tolerate increased activity will improve Outcome: Adequate for Discharge   Problem: Bowel/Gastric: Goal: Gastrointestinal status for postoperative course will improve Outcome: Adequate for Discharge   Problem: Clinical Measurements: Goal: Postoperative complications will be avoided or minimized Outcome: Adequate for Discharge Goal: Signs and symptoms of graft occlusion will improve Outcome: Adequate for Discharge   Problem: Skin Integrity: Goal: Demonstration of wound healing without infection will improve Outcome: Adequate for Discharge   

## 2023-03-13 NOTE — Evaluation (Signed)
Physical Therapy Evaluation Patient Details Name: Carl [name] MRN: [id number] DOB: May 25, 1956 Today's Date: 03/13/2023  History of Present Illness  67 y.o. male is s/p Left iliofemoral endarterectomy with bovine pericardial patch angioplasty for disabling claudication on 03/12/2023 by Dr. Chestine Spore. PMH: CAD status post CABG in 2023, hypertension, hyperlipidemia, tobacco abuse PSH: h/o of 3 back surgeries by Dr. Jordan Likes.   Clinical Impression  Pt admitted with above. Pt motivated and reports minimal pain of 3/10 in L LE at rest, increased to 5-6/10 s/p ambulation. Pt to benefit from RW for safe ambulation at this time as pt with decreased L LE Wbing tolerance. Per pt, he plans to stay with his son and his family upon d/c in which he will have 24/7 assist. Pt completed necessary stair negotiation to enter home this session. Acute PT to cont to follow.       Recommendations for follow up therapy are one component of a multi-disciplinary discharge planning process, led by the attending physician.  Recommendations may be updated based on patient status, additional functional criteria and insurance authorization.  Follow Up Recommendations       Assistance Recommended at Discharge Intermittent Supervision/Assistance (plans on staying with son and dtr in law)  Patient can return home with the following  A little help with walking and/or transfers;A little help with bathing/dressing/bathroom;Assist for transportation;Help with stairs or ramp for entrance    Equipment Recommendations Rolling walker (2 wheels)  Recommendations for Other Services       Functional Status Assessment Patient has had a recent decline in their functional status and demonstrates the ability to make significant improvements in function in a reasonable and predictable amount of time.     Precautions / Restrictions Precautions Precautions: Fall Restrictions Weight Bearing Restrictions: No      Mobility  Bed  Mobility Overal bed mobility: Needs Assistance Bed Mobility: Supine to Sit     Supine to sit: Min assist     General bed mobility comments: increased time, minA for trunk elevation from HOB flat due to limited L hip flexion, educated on rolling like he did for back surgery    Transfers Overall transfer level: Needs assistance Equipment used: None Transfers: Sit to/from Stand Sit to Stand: Min guard           General transfer comment: increased time, min guard due to first time up, wide base of support    Ambulation/Gait Ambulation/Gait assistance: Min guard Gait Distance (Feet): 150 Feet Assistive device: Rolling walker (2 wheels) Gait Pattern/deviations: Step-through pattern, Decreased stride length Gait velocity: dec Gait velocity interpretation: <1.31 ft/sec, indicative of household ambulator   General Gait Details: attempted to amb without RW however pt with dec L LE WBing tolerance, pt amb with RW and reports feeling "alot better". Pt with steady, fluid reciprocal gait pattern without antalgia with use of RW, pt with increased pain to 5/10 s/p amb  Stairs Stairs: Yes Stairs assistance: Min guard Stair Management: One rail Right, Forwards Number of Stairs: 3 General stair comments: pt with good technique, return demonstrated "up with the good (R), down with the bad (L)"  Wheelchair Mobility    Modified Rankin (Stroke Patients Only)       Balance Overall balance assessment: Mild deficits observed, not formally tested  Pertinent Vitals/Pain Pain Assessment Pain Assessment: 0-10 Pain Score: 3  Pain Location: L LE, mostly groin Pain Descriptors / Indicators: Sore Pain Intervention(s): Premedicated before session    Home Living Family/patient expects to be discharged to:: Private residence Living Arrangements: Alone (but is going to son's house) Available Help at Discharge: Family;Available 24  hours/day (going to stay with son) Type of Home: House Home Access: Stairs to enter Entrance Stairs-Rails: Left Entrance Stairs-Number of Steps: 2   Home Layout: One level Home Equipment: Cane - single point;Shower seat;Hand held shower head      Prior Function Prior Level of Function : Independent/Modified Independent             Mobility Comments: indep ADLs Comments: indep     Hand Dominance   Dominant Hand: Right    Extremity/Trunk Assessment   Upper Extremity Assessment Upper Extremity Assessment: Overall WFL for tasks assessed    Lower Extremity Assessment Lower Extremity Assessment: LLE deficits/detail LLE Deficits / Details: ROM limited at hip due to surgical entrance site    Cervical / Trunk Assessment Cervical / Trunk Assessment: Normal (h/o back surgeries)  Communication   Communication: No difficulties  Cognition Arousal/Alertness: Awake/alert Behavior During Therapy: WFL for tasks assessed/performed Overall Cognitive Status: Within Functional Limits for tasks assessed                                          General Comments General comments (skin integrity, edema, etc.): VSS, noted SOB with ambulation however SPO2 >95% on RA, pt reports SOB normal with activity    Exercises     Assessment/Plan    PT Assessment Patient needs continued PT services  PT Problem List Decreased strength;Decreased activity tolerance;Decreased balance;Decreased mobility;Decreased knowledge of use of DME       PT Treatment Interventions DME instruction;Gait training;Stair training;Functional mobility training;Therapeutic activities;Therapeutic exercise;Balance training    PT Goals (Current goals can be found in the Care Plan section)  Acute Rehab PT Goals Patient Stated Goal: home today PT Goal Formulation: With patient Time For Goal Achievement: 03/27/23 Potential to Achieve Goals: Good    Frequency Min 1X/week     Co-evaluation                AM-PAC PT "6 Clicks" Mobility  Outcome Measure Help needed turning from your back to your side while in a flat bed without using bedrails?: A Little Help needed moving from lying on your back to sitting on the side of a flat bed without using bedrails?: A Little Help needed moving to and from a bed to a chair (including a wheelchair)?: None Help needed standing up from a chair using your arms (e.g., wheelchair or bedside chair)?: None Help needed to walk in hospital room?: A Little Help needed climbing 3-5 steps with a railing? : A Little 6 Click Score: 20    End of Session Equipment Utilized During Treatment: Gait belt Activity Tolerance: Patient tolerated treatment well Patient left: in bed;with call bell/phone within reach (sitting on EOB) Nurse Communication: Mobility status (need for RW for d/c) PT Visit Diagnosis: Unsteadiness on feet (R26.81);Muscle weakness (generalized) (M62.81)    Time: 4540-9811 PT Time Calculation (min) (ACUTE ONLY): 16 min   Charges:   PT Evaluation $PT Eval Moderate Complexity: 1 Mod          Rica Heather, PT, DPT Acute Rehabilitation Services Secure  chat preferred Office #: [phone]   Iona Hansen 03/13/2023, 9:50 AM
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
## 2023-03-14 ENCOUNTER — Encounter (HOSPITAL_COMMUNITY): Payer: Self-pay | Admitting: Vascular Surgery

## 2023-03-15 NOTE — Anesthesia Postprocedure Evaluation (Signed)
Anesthesia Post Note  Patient: ANIRUDH BAIZ  Procedure(s) Performed: LEFT ENDARTERECTOMYILIOFEMORAL (Left: Groin) PATCH ANGIOPLASTY USING XENOSURE PATCH 1CMX6CM (Left: Groin)     Patient location during evaluation: PACU Anesthesia Type: General Level of consciousness: awake and alert Pain management: pain level controlled Vital Signs Assessment: post-procedure vital signs reviewed and stable Respiratory status: spontaneous breathing, nonlabored ventilation, respiratory function stable and patient connected to nasal cannula oxygen Cardiovascular status: blood pressure returned to baseline and stable Postop Assessment: no apparent nausea or vomiting Anesthetic complications: no  No notable events documented.  Last Vitals:  Vitals:   03/13/23 0540 03/13/23 0743  BP: 135/80 111/78  Pulse: 63 73  Resp: 19 20  Temp: 36.9 C 36.9 C  SpO2: 94% 94%    Last Pain:  Vitals:   03/13/23 0833  TempSrc:   PainSc: 2                  Torsten Weniger L Martesha Niedermeier

## 2023-03-30 ENCOUNTER — Ambulatory Visit (INDEPENDENT_AMBULATORY_CARE_PROVIDER_SITE_OTHER): Payer: Medicare HMO | Admitting: Physician Assistant

## 2023-03-30 VITALS — BP 149/90 | HR 75 | Temp 97.9°F | Resp 18 | Ht 69.0 in | Wt 214.6 lb

## 2023-03-30 DIAGNOSIS — I70213 Atherosclerosis of native arteries of extremities with intermittent claudication, bilateral legs: Secondary | ICD-10-CM

## 2023-03-30 NOTE — Progress Notes (Signed)
  POST OPERATIVE OFFICE NOTE    CC:  F/u for surgery  HPI:  This is a 67 y.o. male who was seen  with disabling lower extremity claudication.  He underwent an angiogram with Dr. Allyson Sabal showing bilateral common femoral artery disease.  The left leg was more symptomatic.  He had two focal high-grade stenosis in the distal external iliac and proximal common femoral artery. He is s/p Left iliofemoral endarterectomy with bovine pericardial patch angioplasty.    He states his left LE feels much better and he denies claudication on the left.  He has claudication pain on the right, but it is not as bad as the left was.  No Known Allergies  Current Outpatient Medications  Medication Sig Dispense Refill   amiodarone (PACERONE) 200 MG tablet Take 1 tablet (200 mg total) by mouth daily. 90 tablet 3   aspirin 81 MG EC tablet Take 81 mg by mouth daily.     clopidogrel (PLAVIX) 75 MG tablet Take 1 tablet (75 mg total) by mouth daily. 90 tablet 3   ezetimibe (ZETIA) 10 MG tablet Take 1 tablet (10 mg total) by mouth daily. 90 tablet 3   fenofibrate (TRICOR) 145 MG tablet Take 145 mg by mouth daily.      HYDROcodone-acetaminophen (NORCO/VICODIN) 5-325 MG tablet Take 1 tablet by mouth every 6 (six) hours as needed for moderate pain or severe pain.     levothyroxine (SYNTHROID) 125 MCG tablet Take 125 mcg by mouth daily before breakfast.     metoprolol tartrate (LOPRESSOR) 25 MG tablet Take 0.5 tablets (12.5 mg total) by mouth 2 (two) times daily. 90 tablet 3   nitroGLYCERIN (NITROSTAT) 0.4 MG SL tablet Place 0.4 mg under the tongue every 5 (five) minutes as needed for chest pain.     pantoprazole (PROTONIX) 40 MG tablet Take 40 mg by mouth daily.     rosuvastatin (CRESTOR) 40 MG tablet Take 40 mg by mouth every morning.      valsartan (DIOVAN) 160 MG tablet Take 160 mg by mouth daily.     No current facility-administered medications for this visit.     ROS:  See HPI  Physical Exam:    Incision:  left  groin healing well without infection or hematoma Extremities:  palpable DP pulse left LE, right LE warm appears well perfused without signs of ischemia Neuro: sensation intact grossly     Assessment/Plan:  This is a 67 y.o. male who is s/p:Left iliofemoral endarterectomy with bovine pericardial patch angioplasty.   He has a palpable pedal pulse and the groin is healing well.  He has mild claudication symptoms on the right LE that we will watch for now.  He will walk for exercise and continue with medically management on ASA, Plavix and Crestor.     He will f/u in 2 months with Dr. Chestine Spore and ABI's.  If he develops lifestyle limiting claudication on the right he may need intervention.  He has know SFA stenosis.  He is very pleased with his surgical outcome.   Mosetta Pigeon PA-C [facility] [phone]   Clinic MD:  Chestine Spore

## 2023-04-13 ENCOUNTER — Other Ambulatory Visit: Payer: Self-pay

## 2023-04-13 DIAGNOSIS — I70213 Atherosclerosis of native arteries of extremities with intermittent claudication, bilateral legs: Secondary | ICD-10-CM

## 2023-05-12 ENCOUNTER — Encounter: Payer: Self-pay | Admitting: Cardiovascular Disease

## 2023-05-12 ENCOUNTER — Ambulatory Visit: Payer: Medicare HMO | Attending: Cardiovascular Disease | Admitting: Cardiovascular Disease

## 2023-05-12 VITALS — BP 168/96 | HR 58 | Ht 69.0 in | Wt 221.0 lb

## 2023-05-12 DIAGNOSIS — I739 Peripheral vascular disease, unspecified: Secondary | ICD-10-CM

## 2023-05-12 NOTE — Assessment & Plan Note (Signed)
History of peripheral arterial disease status post peripheral angiography by myself/25/24 revealing high-grade calcified bilateral common femoral artery stenosis, 90% calcified left external iliac artery stenosis and high-grade calcified mid right SFA and short segment CTO left SFA stenosis.  The patient underwent left common femoral and iliac endarterectomy and patch angioplasty by Dr. Chestine Spore 03/12/2023.  The incision has healed well.  He still has claudication.  He scheduled to see Dr. Chestine Spore back in the office next month for consideration of right common femoral endarterectomy.  After he heals from that we will consider percutaneous intervention of his SFAs for residual PAD and lifestyle-limiting claudication.

## 2023-05-12 NOTE — Patient Instructions (Signed)
Medication Instructions:  NO CHANGES  *If you need a refill on your cardiac medications before your next appointment, please call your pharmacy*   Follow-Up: At [facility], you and your health needs are our priority.  As part of our continuing mission to provide you with exceptional heart care, we have created designated Provider Care Teams.  These Care Teams include your primary Cardiologist (physician) and Advanced Practice Providers (APPs -  Physician Assistants and Nurse Practitioners) who all work together to provide you with the care you need, when you need it.  We recommend signing up for the patient portal called "MyChart".  Sign up information is provided on this After Visit Summary.  MyChart is used to connect with patients for Virtual Visits (Telemedicine).  Patients are able to view lab/test results, encounter notes, upcoming appointments, etc.  Non-urgent messages can be sent to your provider as well.   To learn more about what you can do with MyChart, go to [email or web address].    Your next appointment:    December 2024 with Dr. Allyson Sabal

## 2023-05-12 NOTE — Progress Notes (Signed)
05/12/2023 Carl [name]   1956/09/02  [id number]  Primary Physician Elizabeth Palau, FNP Primary Cardiologist: [name] MD Nicholes Calamity, [place]  HPI:  Carl [name] is a 67 y.o.   thin-appearing married Caucasian male father of 2 sons, grandfather of 2 grandchildren referred by Jari Favre, PA-C for evaluation of peripheral arterial disease.  His cardiologist is Dr. Eldridge Dace, however, I will be taking over his cardiovascular care as well..  I last saw him in the office 02/02/2023.  He is a retired Designer, fashion/clothing.  Factors include 75-pack-year tobacco abuse having quit at the time of his bypass surgery last month as well as hyperlipidemia.  There is no family history.  He had a remote stroke 5 to 6 years ago with no residual neurologic deficits.  He had 3 back surgeries as well.  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  He did have some postop A-fib.  He is complained of bilateral symmetric lifestyle-limiting claudication for the last several years with recent Doppler studies revealing ABIs in the 0.8 range with moderate common femoral disease bilaterally with high-grade distal right SFA disease and occluded distal left SFA.     Since I saw him 3 months ago he has undergone peripheral angiography by myself/25/24 and has seen Dr. Clotilde Dieter at [facility] vascular specialist.  He is subsequent undergone left iliofemoral endarterectomy with patch angioplasty 03/12/2023 and has done well.  His incision is healed.  He is scheduled to see Dr. Chestine Spore in the office back next month for consideration of staged right common femoral intervention.  Once these are complete we will then entertain the possibility of performing endovascular therapy of his residual SFA disease.   Current Meds  Medication Sig   amiodarone (PACERONE) 200 MG tablet Take 1 tablet (200 mg total) by mouth daily.   aspirin 81 MG EC tablet Take 81 mg by mouth daily.   clopidogrel (PLAVIX) 75  MG tablet Take 1 tablet (75 mg total) by mouth daily.   ezetimibe (ZETIA) 10 MG tablet Take 1 tablet (10 mg total) by mouth daily.   fenofibrate (TRICOR) 145 MG tablet Take 145 mg by mouth daily.    HYDROcodone-acetaminophen (NORCO/VICODIN) 5-325 MG tablet Take 1 tablet by mouth every 6 (six) hours as needed for moderate pain or severe pain.   levothyroxine (SYNTHROID) 125 MCG tablet Take 125 mcg by mouth daily before breakfast.   metoprolol tartrate (LOPRESSOR) 25 MG tablet Take 0.5 tablets (12.5 mg total) by mouth 2 (two) times daily.   pantoprazole (PROTONIX) 40 MG tablet Take 40 mg by mouth daily.   rosuvastatin (CRESTOR) 40 MG tablet Take 40 mg by mouth every morning.    valsartan (DIOVAN) 160 MG tablet Take 160 mg by mouth daily.     No Known Allergies  Social History   Socioeconomic History   Marital status: Married    Spouse name: Not on file   Number of children: Not on file   Years of education: Not on file   Highest education level: Not on file  Occupational History   Not on file  Tobacco Use   Smoking status: Former    Current packs/day: 0.00    Average packs/day: 1 pack/day for 45.0 years (45.0 ttl pk-yrs)    Types: Cigarettes    Start date: 07/28/1977    Quit date: 07/28/2022    Years since quitting: 0.7   Smokeless  tobacco: Never  Vaping Use   Vaping status: Never Used  Substance and Sexual Activity   Alcohol use: Yes    Comment: OCCASIONAL BEER   Drug use: No   Sexual activity: Not on file  Other Topics Concern   Not on file  Social History Narrative   Not on file   Social Determinants of Health   Financial Resource Strain: High Risk (01/07/2023)   Received from [facility], [facility]   Overall Financial Resource Strain (CARDIA)    Difficulty of Paying Living Expenses: Hard  Food Insecurity: No Food Insecurity (01/07/2023)   Received from [facility], [facility]   Hunger Vital Sign    Worried About Running Out of Food in the Last Year: Never  true    Ran Out of Food in the Last Year: Never true  Transportation Needs: No Transportation Needs (01/07/2023)   Received from [facility], [facility]   PRAPARE - Transportation    Lack of Transportation (Medical): No    Lack of Transportation (Non-Medical): No  Physical Activity: Sufficiently Active (03/05/2022)   Received from [facility], [facility]   Exercise Vital Sign    Days of Exercise per Week: 7 days    Minutes of Exercise per Session: 30 min  Stress: No Stress Concern Present (03/05/2022)   Received from [facility] of Occupational Health - Occupational Stress Questionnaire    Feeling of Stress : Only a little  Social Connections: Unknown (03/06/2023)   Received from [facility], [facility]   Social Network    Social Network: Not on file  Intimate Partner Violence: Unknown (03/06/2023)   Received from [facility], [facility]   HITS    Physically Hurt: Not on file    Insult or Talk Down To: Not on file    Threaten Physical Harm: Not on file    Scream or Curse: Not on file     Review of Systems: General: negative for chills, fever, night sweats or weight changes.  Cardiovascular: negative for chest pain, dyspnea on exertion, edema, orthopnea, palpitations, paroxysmal nocturnal dyspnea or shortness of breath Dermatological: negative for rash Respiratory: negative for cough or wheezing Urologic: negative for hematuria Abdominal: negative for nausea, vomiting, diarrhea, bright red blood per rectum, melena, or hematemesis Neurologic: negative for visual changes, syncope, or dizziness All other systems reviewed and are otherwise negative except as noted above.    Blood pressure (!) 168/96, pulse (!) 58, height 5\' 9"  (1.753 m), weight 221 lb (100.2 kg), SpO2 98%.  General appearance: alert and no distress Neck: no adenopathy, no carotid bruit, no JVD, supple, symmetrical, trachea midline, and thyroid not enlarged,  symmetric, no tenderness/mass/nodules Lungs: clear to auscultation bilaterally Heart: regular rate and rhythm, S1, S2 normal, no murmur, click, rub or gallop Extremities: extremities normal, atraumatic, no cyanosis or edema Pulses: Decreased pedal pulses Skin: Skin color, texture, turgor normal. No rashes or lesions Neurologic: Grossly normal  EKG not performed today      ASSESSMENT AND PLAN:   Peripheral vascular disease (HCC) History of peripheral arterial disease status post peripheral angiography by myself/25/24 revealing high-grade calcified bilateral common femoral artery stenosis, 90% calcified left external iliac artery stenosis and high-grade calcified mid right SFA and short segment CTO left SFA stenosis.  The patient underwent left common femoral and iliac endarterectomy and patch angioplasty by Dr. Chestine Spore 03/12/2023.  The incision has healed well.  He still has claudication.  He scheduled  to see Dr. Chestine Spore back in the office next month for consideration of right common femoral endarterectomy.  After he heals from that we will consider percutaneous intervention of his SFAs for residual PAD and lifestyle-limiting claudication.     [name] MD FACP,FACC,FAHA, [facility] 05/12/2023 9:35 AM

## 2023-06-01 ENCOUNTER — Encounter: Payer: Self-pay | Admitting: Vascular Surgery

## 2023-06-01 ENCOUNTER — Ambulatory Visit (INDEPENDENT_AMBULATORY_CARE_PROVIDER_SITE_OTHER): Payer: Medicare HMO | Admitting: Vascular Surgery

## 2023-06-01 ENCOUNTER — Ambulatory Visit (HOSPITAL_COMMUNITY)
Admission: RE | Admit: 2023-06-01 | Discharge: 2023-06-01 | Disposition: A | Discharge status: Home / Self Care | Payer: Medicare HMO | Source: Ambulatory Visit | Attending: Vascular Surgery | Admitting: Vascular Surgery

## 2023-06-01 VITALS — BP 165/86 | HR 67 | Temp 98.2°F | Resp 18 | Ht 69.0 in | Wt 223.4 lb

## 2023-06-01 DIAGNOSIS — I70213 Atherosclerosis of native arteries of extremities with intermittent claudication, bilateral legs: Secondary | ICD-10-CM

## 2023-06-01 LAB — VAS US ABI WITH/WO TBI
Left ABI: 0.77
Right ABI: 0.8
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
## 2023-06-10 ENCOUNTER — Other Ambulatory Visit: Payer: Self-pay

## 2023-06-10 DIAGNOSIS — I70213 Atherosclerosis of native arteries of extremities with intermittent claudication, bilateral legs: Secondary | ICD-10-CM

## 2023-06-15 ENCOUNTER — Inpatient Hospital Stay: Admission: RE | Admit: 2023-06-15 | Payer: Medicare HMO | Source: Ambulatory Visit

## 2023-06-25 ENCOUNTER — Ambulatory Visit
Admission: RE | Admit: 2023-06-25 | Discharge: 2023-06-25 | Disposition: A | Discharge status: Home / Self Care | Payer: Medicare HMO | Source: Ambulatory Visit | Attending: Vascular Surgery | Admitting: Vascular Surgery

## 2023-06-25 DIAGNOSIS — I70213 Atherosclerosis of native arteries of extremities with intermittent claudication, bilateral legs: Secondary | ICD-10-CM

## 2023-06-25 MED ORDER — IOPAMIDOL (ISOVUE-370) INJECTION 76%
125.0000 mL | Freq: Once | INTRAVENOUS | Status: AC | PRN
  Administered 2023-06-25: 100 mL via INTRAVENOUS

## 2023-07-08 ENCOUNTER — Encounter: Payer: Self-pay | Admitting: Physician Assistant

## 2023-07-08 ENCOUNTER — Other Ambulatory Visit: Payer: Self-pay | Admitting: Physician Assistant

## 2023-07-08 DIAGNOSIS — F17200 Nicotine dependence, unspecified, uncomplicated: Secondary | ICD-10-CM
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
## 2023-07-13 ENCOUNTER — Ambulatory Visit: Payer: Medicare HMO | Admitting: Vascular Surgery

## 2023-07-13 ENCOUNTER — Encounter: Payer: Self-pay | Admitting: Vascular Surgery

## 2023-07-13 VITALS — BP 187/113 | HR 47 | Temp 97.6°F | Wt 225.0 lb

## 2023-07-13 DIAGNOSIS — I70213 Atherosclerosis of native arteries of extremities with intermittent claudication, bilateral legs: Secondary | ICD-10-CM

## 2023-07-15 ENCOUNTER — Ambulatory Visit
Admission: RE | Admit: 2023-07-15 | Discharge: 2023-07-15 | Disposition: A | Discharge status: Home / Self Care | Payer: Medicare HMO | Source: Ambulatory Visit | Attending: Physician Assistant | Admitting: Physician Assistant

## 2023-07-15 DIAGNOSIS — F17200 Nicotine dependence, unspecified, uncomplicated: Secondary | ICD-10-CM

## 2023-08-12 ENCOUNTER — Other Ambulatory Visit: Payer: Self-pay

## 2023-08-12 ENCOUNTER — Encounter (HOSPITAL_COMMUNITY): Payer: Self-pay

## 2023-08-12 ENCOUNTER — Emergency Department (HOSPITAL_COMMUNITY): Payer: Medicare HMO

## 2023-08-12 ENCOUNTER — Emergency Department (HOSPITAL_COMMUNITY)
Admission: EM | Admit: 2023-08-12 | Discharge: 2023-08-12 | Disposition: A | Discharge status: Home / Self Care | Payer: Medicare HMO | Attending: Emergency Medicine | Admitting: Emergency Medicine

## 2023-08-12 DIAGNOSIS — G8929 Other chronic pain: Secondary | ICD-10-CM | POA: Diagnosis not present

## 2023-08-12 DIAGNOSIS — E039 Hypothyroidism, unspecified: Secondary | ICD-10-CM | POA: Diagnosis not present

## 2023-08-12 DIAGNOSIS — Z7982 Long term (current) use of aspirin: Secondary | ICD-10-CM | POA: Insufficient documentation

## 2023-08-12 DIAGNOSIS — M79605 Pain in left leg: Secondary | ICD-10-CM | POA: Insufficient documentation

## 2023-08-12 DIAGNOSIS — I10 Essential (primary) hypertension: Secondary | ICD-10-CM | POA: Diagnosis not present

## 2023-08-12 DIAGNOSIS — M5442 Lumbago with sciatica, left side: Secondary | ICD-10-CM | POA: Diagnosis not present

## 2023-08-12 DIAGNOSIS — R0602 Shortness of breath: Secondary | ICD-10-CM | POA: Insufficient documentation

## 2023-08-12 DIAGNOSIS — I4891 Unspecified atrial fibrillation: Secondary | ICD-10-CM | POA: Diagnosis not present

## 2023-08-12 DIAGNOSIS — I2510 Atherosclerotic heart disease of native coronary artery without angina pectoris: Secondary | ICD-10-CM | POA: Diagnosis not present

## 2023-08-12 DIAGNOSIS — M79604 Pain in right leg: Secondary | ICD-10-CM | POA: Insufficient documentation

## 2023-08-12 DIAGNOSIS — Z79899 Other long term (current) drug therapy: Secondary | ICD-10-CM | POA: Diagnosis not present

## 2023-08-12 DIAGNOSIS — Z7901 Long term (current) use of anticoagulants: Secondary | ICD-10-CM | POA: Diagnosis not present

## 2023-08-12 LAB — URINALYSIS, ROUTINE W REFLEX MICROSCOPIC
Bacteria, UA: NONE SEEN
Bilirubin Urine: NEGATIVE
Glucose, UA: NEGATIVE mg/dL
Ketones, ur: NEGATIVE mg/dL
Leukocytes,Ua: NEGATIVE
Nitrite: NEGATIVE
Protein, ur: NEGATIVE mg/dL
Specific Gravity, Urine: 1.006 (ref 1.005–1.030)
pH: 8 (ref 5.0–8.0)

## 2023-08-12 LAB — BRAIN NATRIURETIC PEPTIDE: B Natriuretic Peptide: 264.9 pg/mL — ABNORMAL HIGH (ref 0.0–100.0)

## 2023-08-12 LAB — BASIC METABOLIC PANEL
Anion gap: 10 (ref 5–15)
BUN: 10 mg/dL (ref 8–23)
CO2: 21 mmol/L — ABNORMAL LOW (ref 22–32)
Calcium: 9.4 mg/dL (ref 8.9–10.3)
Chloride: 100 mmol/L (ref 98–111)
Creatinine, Ser: 1.54 mg/dL — ABNORMAL HIGH (ref 0.61–1.24)
GFR, Estimated: 49 mL/min — ABNORMAL LOW (ref >60)
Glucose, Bld: 106 mg/dL — ABNORMAL HIGH (ref 70–99)
Potassium: 3.8 mmol/L (ref 3.5–5.1)
Sodium: 131 mmol/L — ABNORMAL LOW (ref 135–145)

## 2023-08-12 LAB — CBC
HCT: 35.5 % — ABNORMAL LOW (ref 39.0–52.0)
Hemoglobin: 11.8 g/dL — ABNORMAL LOW (ref 13.0–17.0)
MCH: 29.7 pg (ref 26.0–34.0)
MCHC: 33.2 g/dL (ref 30.0–36.0)
MCV: 89.4 fL (ref 80.0–100.0)
Platelets: 158 10*3/uL (ref 150–400)
RBC: 3.97 MIL/uL — ABNORMAL LOW (ref 4.22–5.81)
RDW: 13 % (ref 11.5–15.5)
WBC: 4.6 10*3/uL (ref 4.0–10.5)
nRBC: 0 % (ref 0.0–0.2)

## 2023-08-12 LAB — D-DIMER, QUANTITATIVE: D-Dimer, Quant: 0.3 ug{FEU}/mL (ref 0.00–0.50)

## 2023-08-12 LAB — CK: Total CK: 333 U/L (ref 49–397)

## 2023-08-12 LAB — TROPONIN I (HIGH SENSITIVITY)
Troponin I (High Sensitivity): 24 ng/L — ABNORMAL HIGH (ref <18)
Troponin I (High Sensitivity): 21 ng/L — ABNORMAL HIGH (ref <18)

## 2023-08-12 MED ORDER — HYDROMORPHONE HCL 1 MG/ML IJ SOLN
0.5000 mg | Freq: Once | INTRAMUSCULAR | Status: AC
  Administered 2023-08-12: 0.5 mg via INTRAVENOUS
  Filled 2023-08-12: qty 1

## 2023-08-12 MED ORDER — DEXAMETHASONE SODIUM PHOSPHATE 10 MG/ML IJ SOLN
8.0000 mg | Freq: Once | INTRAMUSCULAR | Status: AC
  Administered 2023-08-12: 8 mg via INTRAVENOUS
  Filled 2023-08-12: qty 1

## 2023-08-12 MED ORDER — FENTANYL CITRATE PF 50 MCG/ML IJ SOSY
50.0000 ug | PREFILLED_SYRINGE | Freq: Once | INTRAMUSCULAR | Status: AC
  Administered 2023-08-12: 50 ug via INTRAVENOUS
  Filled 2023-08-12: qty 1

## 2023-08-12 MED ORDER — GABAPENTIN 100 MG PO CAPS: 100.0000 mg | ORAL_CAPSULE | Freq: Three times a day (TID) | ORAL | 0 refills | Status: AC

## 2023-08-12 MED ORDER — METHYLPREDNISOLONE 4 MG PO TBPK: ORAL_TABLET | ORAL | 0 refills | Status: DC

## 2023-08-12 MED ORDER — ACETAMINOPHEN 325 MG PO TABS
650.0000 mg | ORAL_TABLET | Freq: Once | ORAL | Status: AC
Start: 2023-08-12 — End: 2023-08-12
  Administered 2023-08-12: 650 mg via ORAL
  Filled 2023-08-12: qty 2

## 2023-08-12 NOTE — ED Notes (Signed)
Patient transported to X-ray 
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
## 2023-08-12 NOTE — ED Triage Notes (Signed)
Pt presents via EMS c/o dyspnea on exertion and weakness over the last several days. EMS reports pt has a hx of CAD and PVD in lower extremities. Pt reports has CABG November 2023. A&O x4.

## 2023-09-06 ENCOUNTER — Emergency Department (HOSPITAL_COMMUNITY): Payer: Medicare HMO

## 2023-09-06 ENCOUNTER — Other Ambulatory Visit: Payer: Self-pay

## 2023-09-06 ENCOUNTER — Emergency Department (HOSPITAL_COMMUNITY)
Admission: EM | Admit: 2023-09-06 | Discharge: 2023-09-06 | Disposition: A | Discharge status: Home / Self Care | Payer: Medicare HMO | Attending: Student | Admitting: Student

## 2023-09-06 ENCOUNTER — Encounter (HOSPITAL_COMMUNITY): Payer: Self-pay

## 2023-09-06 DIAGNOSIS — Y93F9 Activity, other caregiving: Secondary | ICD-10-CM | POA: Insufficient documentation

## 2023-09-06 DIAGNOSIS — Z8673 Personal history of transient ischemic attack (TIA), and cerebral infarction without residual deficits: Secondary | ICD-10-CM | POA: Insufficient documentation

## 2023-09-06 DIAGNOSIS — W010XXA Fall on same level from slipping, tripping and stumbling without subsequent striking against object, initial encounter: Secondary | ICD-10-CM | POA: Diagnosis not present

## 2023-09-06 DIAGNOSIS — Z7982 Long term (current) use of aspirin: Secondary | ICD-10-CM | POA: Diagnosis not present

## 2023-09-06 DIAGNOSIS — S59912A Unspecified injury of left forearm, initial encounter: Secondary | ICD-10-CM | POA: Diagnosis present

## 2023-09-06 DIAGNOSIS — S5012XA Contusion of left forearm, initial encounter: Secondary | ICD-10-CM | POA: Insufficient documentation

## 2023-09-06 DIAGNOSIS — W19XXXA Unspecified fall, initial encounter: Secondary | ICD-10-CM

## 2023-09-06 NOTE — ED Provider Notes (Signed)
[place] EMERGENCY DEPARTMENT AT [facility] Provider Note   CSN: [id number] Arrival date & time: 09/06/23  1449     History  Chief Complaint  Patient presents with   Arm Injury    Carl [name] is a 67 y.o. male, hx of CVA on plavix, who presents to the ED 2/2 to mechanical fall, after trying to prevent his wife from falling.  He states he was trying to prevent his wife from falling, and he reached out to get her, and then asked me he tripped over his feet, and landed on his left arm.  He states he had some pain tenderness to the lower arm, denies any other pain.  Did not fall and hit his head, is not on Eliquis, warfarin, or Xarelto.  Just takes Plavix, for his history of CVA.  He denies any kind of chest pain, shortness of breath, abdominal pain, or other pain.  Prior to Admission medications   Medication Sig Start Date End Date Taking? Authorizing Provider  amiodarone (PACERONE) 200 MG tablet Take 1 tablet (200 mg total) by mouth daily. 01/07/23   Corky Crafts, MD  aspirin 81 MG EC tablet Take 81 mg by mouth daily.    [provider]  clopidogrel (PLAVIX) 75 MG tablet Take 1 tablet (75 mg total) by mouth daily. 01/07/23   Corky Crafts, MD  ezetimibe (ZETIA) 10 MG tablet Take 1 tablet (10 mg total) by mouth daily. 01/07/23   Corky Crafts, MD  fenofibrate (TRICOR) 145 MG tablet Take 145 mg by mouth daily.     [provider]  gabapentin (NEURONTIN) 100 MG capsule Take 1 capsule (100 mg total) by mouth 3 (three) times daily. 08/12/23   Nira Conn, MD  HYDROcodone-acetaminophen (NORCO/VICODIN) 5-325 MG tablet Take 1 tablet by mouth every 6 (six) hours as needed for moderate pain or severe pain. 08/10/22   [provider]  levothyroxine (SYNTHROID) 125 MCG tablet Take 125 mcg by mouth daily before breakfast. 07/15/22   [provider]  methylPREDNISolone (MEDROL DOSEPAK) 4 MG TBPK tablet Use as directed on  the package 08/12/23   Cardama, Amadeo Garnet, MD  metoprolol tartrate (LOPRESSOR) 25 MG tablet Take 0.5 tablets (12.5 mg total) by mouth 2 (two) times daily. 01/07/23   Corky Crafts, MD  nitroGLYCERIN (NITROSTAT) 0.4 MG SL tablet Place 0.4 mg under the tongue every 5 (five) minutes as needed for chest pain. 06/04/15   [provider]  pantoprazole (PROTONIX) 40 MG tablet Take 40 mg by mouth daily. 09/02/22   [provider]  rosuvastatin (CRESTOR) 40 MG tablet Take 40 mg by mouth every morning.     [provider]  valsartan (DIOVAN) 160 MG tablet Take 160 mg by mouth daily. 12/31/22   [provider]      Allergies    Patient has no known allergies.    Review of Systems   Review of Systems  Cardiovascular:  Negative for chest pain.  Skin:  Positive for color change.    Physical Exam Updated Vital Signs BP (!) 150/104   Pulse 65   Temp 98.2 F (36.8 C)   Resp 17   SpO2 98%  Physical Exam Vitals and nursing note reviewed.  Constitutional:      General: He is not in acute distress.    Appearance: He is well-developed.  HENT:     Head: Normocephalic and atraumatic.  Eyes:     Conjunctiva/sclera:  Conjunctivae normal.  Cardiovascular:     Rate and Rhythm: Normal rate and regular rhythm.     Pulses:          Radial pulses are 2+ on the right side and 2+ on the left side.     Heart sounds: No murmur heard. Pulmonary:     Effort: Pulmonary effort is normal. No respiratory distress.     Breath sounds: Normal breath sounds.  Abdominal:     Palpations: Abdomen is soft.     Tenderness: There is no abdominal tenderness.  Musculoskeletal:        General: No swelling.     Cervical back: Neck supple.     Comments: Left hand/arm: TTP of of mid forearm along ecchymoses. Radial pulses present. Grip strength intact. Able to flex, extend, ulnar and radial deviate wrist. Able to supinate, pronate, and flex and extend arm. Two point discrimination  intact. Normal thumb opposition. Intact ROM for all MCPs, PIPs, and DIPs.  No snuffbox ttp. No sensory deficits. Capillary refill <2sec   No cervical spine  or chest wall  or abdominal ttp  Skin:    General: Skin is warm and dry.     Capillary Refill: Capillary refill takes less than 2 seconds.     Comments: +induration noted to L lower forearm w/ecchymoses. No other wounds or ecchymoses  Neurological:     Mental Status: He is alert.  Psychiatric:        Mood and Affect: Mood normal.     ED Results / Procedures / Treatments   Labs (all labs ordered are listed, but only abnormal results are displayed) Labs Reviewed - No data to display  EKG None  Radiology DG Forearm Left Result Date: 09/06/2023 CLINICAL DATA:  Injury.  Hematoma. EXAM: LEFT FOREARM - 2 VIEW COMPARISON:  Wrist radiograph 08/05/2013 FINDINGS: No acute fracture. The cortical margins of the radius and ulna are intact. Wrist and elbow alignment are maintained with degenerative change of both joints. Focal area of soft tissue thickening and edema overlying the proximal forearm likely represents hematoma. Punctate radiopaque foreign body adjacent to the distal radius is chronic and was present on prior exam. No soft tissue gas. IMPRESSION: 1. Focal soft tissue thickening and edema overlying the proximal forearm likely represents hematoma. 2. No acute fracture or subluxation. Electronically Signed   By: Narda Rutherford M.D.   On: 09/06/2023 16:46    Procedures Procedures    Medications Ordered in ED Medications - No data to display  ED Course/ Medical Decision Making/ A&P                                 Medical Decision Making Patient is a 67 year old male, who presents to the ED secondary to left arm pain, has been going on since he fell today, while trying to catch his wife from falling.Marland Kitchen  He induration, and ecchymosis, left forearm.  No other tenderness to palpation, no cervical spine tenderness, chest wall or  abdominal tenderness.  No other wounds, no penetrating wounds.  X-ray obtained, shows no acute fracture.  There are some thickening, which represents likely hematoma.  I agree with this on my physical exam, we will discharge him home with conservative treatment, follow-up with PCP.  He has no other tenderness to palpation, did not hit his head, is only on Plavix.  He has a good pulse, and the area has not grown  tremendously, since it first occurred.  There is no throbbing, this area, or pulsatile sensation, which is reassuring  Amount and/or Complexity of Data Reviewed Radiology: ordered.    Final Clinical Impression(s) / ED Diagnoses Final diagnoses:  Fall, initial encounter  Hematoma of left forearm    Rx / DC Orders ED Discharge Orders     None         Aretha Levi, Harley Alto, PA 09/06/23 1726    Glendora Score, MD 09/07/23 [phone]

## 2023-09-06 NOTE — ED Triage Notes (Signed)
Pt arrives via EMS. Pt was attempting to catch her wife when she fell. PT injured his left arm when hell. Large hematoma noted to left forearm. PT did not his head, no loc, is on plavix. PT AxOx4.

## 2023-09-06 NOTE — ED Provider Triage Note (Signed)
Emergency Medicine Provider Triage Evaluation Note  Carl [name] , a 67 y.o. male  was evaluated in triage.  Pt complains of fall. States that he noticed his wife was falling and went to catch her and fell to the ground. He did not hit his head or loose consciousness. He is endorsing left arm pain and swelling. Denies any other injuries or complaints. Is on plavix  Review of Systems  Positive:  Negative:   Physical Exam  BP (!) 150/104   Pulse 65   Temp 98.2 F (36.8 C)   Resp 17   SpO2 98%  Gen:   Awake, no distress   Resp:  Normal effort  MSK:   Moves extremities without difficulty  Other:  Left forearm swelling and tenderness. Compartments soft with good distal pulses and sensation. No obvious deformity. No other areas of focal bony tenderness.  Medical Decision Making  Medically screening exam initiated at 4:09 PM.  Appropriate orders placed.  Carl [name] was informed that the remainder of the evaluation will be completed by another provider, this initial triage assessment does not replace that evaluation, and the importance of remaining in the ED until their evaluation is complete.  Work-up initiated   Carl [name] 09/06/23 1612

## 2023-09-06 NOTE — Discharge Instructions (Addendum)
Please follow-up with your primary care doctor, you have a large bruise, in your arm, findings hematoma.  You should rest the area, ice it, and follow-up with the PCP.  Return to the ER if you have any worsening symptoms.  You have a pulse, which is reassuring.  You have any intractable nausea, headache, return as well

## 2023-09-07 ENCOUNTER — Encounter: Payer: Self-pay | Admitting: Cardiovascular Disease

## 2023-09-07 ENCOUNTER — Ambulatory Visit: Payer: Medicare HMO | Attending: Cardiovascular Disease | Admitting: Cardiovascular Disease

## 2023-09-07 VITALS — BP 162/92 | HR 55 | Ht 69.0 in | Wt 220.0 lb

## 2023-09-07 DIAGNOSIS — I739 Peripheral vascular disease, unspecified: Secondary | ICD-10-CM | POA: Diagnosis not present

## 2023-09-07 LAB — CBC WITH DIFFERENTIAL/PLATELET

## 2023-09-07 NOTE — Patient Instructions (Addendum)
Medication Instructions:  Your physician recommends that you continue on your current medications as directed. Please refer to the Current Medication list given to you today.  *If you need a refill on your cardiac medications before your next appointment, please call your pharmacy*   Lab Work: Your physician recommends that you have labs drawn today: BMET & CBC  If you have labs (blood work) drawn today and your tests are completely normal, you will receive your results only by: MyChart Message (if you have MyChart) OR A paper copy in the mail If you have any lab test that is abnormal or we need to change your treatment, we will call you to review the results.   Testing/Procedures: Your physician has requested that you have a lower extremity arterial duplex. During this test, ultrasound is used to evaluate arterial blood flow in the legs. Allow one hour for this exam. There are no restrictions or special instructions. This will take place at [address], [address]. To do 1-2 weeks after procedure (12/26).  Please note: We ask at that you not bring children with you during ultrasound (echo/ vascular) testing. Due to room size and safety concerns, children are not allowed in the ultrasound rooms during exams. Our front office staff cannot provide observation of children in our lobby area while testing is being conducted. An adult accompanying a patient to their appointment will only be allowed in the ultrasound room at the discretion of the ultrasound technician under special circumstances. We apologize for any inconvenience.  Your physician has requested that you have an ankle brachial index (ABI). During this test an ultrasound and blood pressure cuff are used to evaluate the arteries that supply the arms and legs with blood. Allow thirty minutes for this exam. There are no restrictions or special instructions. This will take place at [address] [facility], [address]. To do 1-2 weeks after  procedure (12/26)   Please note: We ask at that you not bring children with you during ultrasound (echo/ vascular) testing. Due to room size and safety concerns, children are not allowed in the ultrasound rooms during exams. Our front office staff cannot provide observation of children in our lobby area while testing is being conducted. An adult accompanying a patient to their appointment will only be allowed in the ultrasound room at the discretion of the ultrasound technician under special circumstances. We apologize for any inconvenience.    Follow-Up: At [facility], you and your health needs are our priority.  As part of our continuing mission to provide you with exceptional heart care, we have created designated Provider Care Teams.  These Care Teams include your primary Cardiologist (physician) and Advanced Practice Providers (APPs -  Physician Assistants and Nurse Practitioners) who all work together to provide you with the care you need, when you need it.  We recommend signing up for the patient portal called "MyChart".  Sign up information is provided on this After Visit Summary.  MyChart is used to connect with patients for Virtual Visits (Telemedicine).  Patients are able to view lab/test results, encounter notes, upcoming appointments, etc.  Non-urgent messages can be sent to your provider as well.   To learn more about what you can do with MyChart, go to [email or web address].    Your next appointment:   2-3 week(s) after procedure (12/26)  Provider:   [name], MD    Other Instructions       Cardiac/Peripheral Catheterization   You are scheduled for  a Peripheral Angiogram on Thursday, December 26 with Dr. [name].  1. Please arrive at the [facility] (Main Entrance A) at [facility]: [address] [place], [place] [address] at 7:30 AM (This time is 2 hour(s) before your procedure to ensure your preparation).   Free valet parking service  is available. You will check in at ADMITTING. The support person will be asked to wait in the waiting room.  It is OK to have someone drop you off and come back when you are ready to be discharged.        Special note: Every effort is made to have your procedure done on time. Please understand that emergencies sometimes delay scheduled procedures.  2. Diet: Do not eat solid foods after midnight.  You may have clear liquids until 5 AM the day of the procedure.  3. Labs: You will need to have blood drawn today (12/17).  4. Medication instructions in preparation for your procedure:    On the morning of your procedure, take Aspirin 81 mg and Plavix/Clopidogrel and any morning medicines NOT listed above.  You may use sips of water.  5. Plan to go home the same day, you will only stay overnight if medically necessary. 6. You MUST have a responsible adult to drive you home. 7. An adult MUST be with you the first 24 hours after you arrive home. 8. Bring a current list of your medications, and the last time and date medication taken. 9. Bring ID and current insurance cards. 10.Please wear clothes that are easy to get on and off and wear slip-on shoes.  Thank you for allowing [place] to care for you!   -- [facility]

## 2023-09-07 NOTE — Progress Notes (Signed)
09/07/2023 Carl [name]   Sep 23, 1955  [id number]  Primary Physician Elizabeth Palau, FNP Primary Cardiologist: [name] MD Nicholes Calamity, [place]  HPI:  Carl [name] is a 67 y.o.   thin-appearing married Caucasian male father of 2 sons, grandfather of 2 grandchildren referred by Jari Favre, PA-C for evaluation of peripheral arterial disease.  His cardiologist is Dr. Eldridge Dace, however, I will be taking over his cardiovascular care as well..  I last saw him in the office 05/12/2023.  He is a retired Designer, fashion/clothing.  Factors include 75-pack-year tobacco abuse having quit at the time of his bypass surgery last month as well as hyperlipidemia.  There is no family history.  He had a remote stroke 5 to 6 years ago with no residual neurologic deficits.  He had 3 back surgeries as well.  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  He did have some postop A-fib.  He is complained of bilateral symmetric lifestyle-limiting claudication for the last several years with recent Doppler studies revealing ABIs in the 0.8 range with moderate common femoral disease bilaterally with high-grade distal right SFA disease and occluded distal left SFA.    He underwent peripheral angiography by myself 01/14/23 and has seen Dr. Clotilde Dieter at [facility].  He is subsequent undergone left iliofemoral endarterectomy with patch angioplasty 03/12/2023 and has done well.  His incision is healed.  Unfortunately, his claudication has not significantly improved.  His ABIs have remained unchanged.  He did see Dr. Chestine Spore back in the office who obtained a lower extremity CTA confirming his anatomy.  He wishes to undergo left SFA intervention.  I discussed this with Dr. Chestine Spore and he feels this can be done via the right femoral approach.     Current Meds  Medication Sig   amiodarone (PACERONE) 200 MG tablet Take 1 tablet (200 mg total) by mouth daily.   aspirin 81 MG EC tablet Take 81 mg by mouth  daily.   clopidogrel (PLAVIX) 75 MG tablet Take 1 tablet (75 mg total) by mouth daily.   ezetimibe (ZETIA) 10 MG tablet Take 1 tablet (10 mg total) by mouth daily.   fenofibrate (TRICOR) 145 MG tablet Take 145 mg by mouth daily.    gabapentin (NEURONTIN) 100 MG capsule Take 1 capsule (100 mg total) by mouth 3 (three) times daily.   HYDROcodone-acetaminophen (NORCO/VICODIN) 5-325 MG tablet Take 1 tablet by mouth every 6 (six) hours as needed for moderate pain or severe pain.   levothyroxine (SYNTHROID) 125 MCG tablet Take 125 mcg by mouth daily before breakfast.   methylPREDNISolone (MEDROL DOSEPAK) 4 MG TBPK tablet Use as directed on the package   metoprolol tartrate (LOPRESSOR) 25 MG tablet Take 0.5 tablets (12.5 mg total) by mouth 2 (two) times daily.   nitroGLYCERIN (NITROSTAT) 0.4 MG SL tablet Place 0.4 mg under the tongue every 5 (five) minutes as needed for chest pain.   pantoprazole (PROTONIX) 40 MG tablet Take 40 mg by mouth daily.   predniSONE (STERAPRED UNI-PAK 21 TAB) 10 MG (21) TBPK tablet Take as directed, complete whole course (6 day course)   rosuvastatin (CRESTOR) 40 MG tablet Take 40 mg by mouth every morning.    valsartan (DIOVAN) 160 MG tablet Take 160 mg by mouth daily.     No Known Allergies  Social History   Socioeconomic History   Marital status: Married    Spouse name: Not  on file   Number of children: Not on file   Years of education: Not on file   Highest education level: Not on file  Occupational History   Not on file  Tobacco Use   Smoking status: Former    Current packs/day: 0.00    Average packs/day: 1 pack/day for 45.0 years (45.0 ttl pk-yrs)    Types: Cigarettes    Start date: 07/28/1977    Quit date: 07/28/2022    Years since quitting: 1.1   Smokeless tobacco: Never  Vaping Use   Vaping status: Never Used  Substance and Sexual Activity   Alcohol use: Yes    Comment: OCCASIONAL BEER   Drug use: No   Sexual activity: Not on file  Other Topics  Concern   Not on file  Social History Narrative   Not on file   Social Drivers of Health   Financial Resource Strain: Low Risk  (07/08/2023)   Received from [facility]   Overall Financial Resource Strain (CARDIA)    Difficulty of Paying Living Expenses: Not very hard  Food Insecurity: No Food Insecurity (07/08/2023)   [facility] from [facility]   Hunger Vital Sign    Worried About Running Out of Food in the Last Year: Never true    Ran Out of Food in the Last Year: Never true  Transportation Needs: No Transportation Needs (07/08/2023)   Received from [facility] - Transportation    Lack of Transportation (Medical): No    Lack of Transportation (Non-Medical): No  Physical Activity: Insufficiently Active (07/08/2023)   Received from [facility]   Exercise Vital Sign    Days of Exercise per Week: 7 days    Minutes of Exercise per Session: 10 min  Stress: No Stress Concern Present (07/08/2023)   Received from [facility] - Occupational Stress Questionnaire    Feeling of Stress : Only a little  Social Connections: Socially Integrated (07/08/2023)   Received from [facility]   Social Network    How would you rate your social network (family, work, friends)?: Good participation with social networks  Intimate Partner Violence: Not At Risk (07/08/2023)   Received from [facility]   HITS    Over the last 12 months how often did your partner physically hurt you?: Never    Over the last 12 months how often did your partner insult you or talk down to you?: Never    Over the last 12 months how often did your partner threaten you with physical harm?: Never    Over the last 12 months how often did your partner scream or curse at you?: Never     Review of Systems: General: negative for chills, fever, night sweats or weight changes.  Cardiovascular: negative for chest pain, dyspnea on exertion, edema, orthopnea,  palpitations, paroxysmal nocturnal dyspnea or shortness of breath Dermatological: negative for rash Respiratory: negative for cough or wheezing Urologic: negative for hematuria Abdominal: negative for nausea, vomiting, diarrhea, bright red blood per rectum, melena, or hematemesis Neurologic: negative for visual changes, syncope, or dizziness All other systems reviewed and are otherwise negative except as noted above.    Blood pressure (!) 162/92, pulse (!) 55, height 5\' 9"  (1.753 m), weight 220 lb (99.8 kg), SpO2 94%.  General appearance: alert and no distress Neck: no adenopathy, no carotid bruit, no JVD, supple, symmetrical, trachea midline, and thyroid not enlarged, symmetric, no tenderness/mass/nodules Lungs: clear to auscultation  bilaterally Heart: regular rate and rhythm, S1, S2 normal, no murmur, click, rub or gallop Extremities: extremities normal, atraumatic, no cyanosis or edema Pulses: Diminished pedal pulses Skin: Skin color, texture, turgor normal. No rashes or lesions Neurologic: Grossly normal  EKG not performed today      ASSESSMENT AND PLAN:   Peripheral vascular disease [facility]) Mr. Tissot returns today for follow-up of his PAD.  I last saw him in the office 05/12/2023.  I had referred him to Dr. Chestine Spore because of bilateral common femoral calcified lesions as well as left external iliac artery disease.  He underwent left iliofemoral endarterectomy with patch angioplasty on 03/12/2023.  Continues to have symptoms.  ABIs really did not change.  He does have a short segment calcified distal left SFA CTO as well as calcified right common femoral and distal right SFA disease as well.  He still has lifestyle-limiting claudication and wishes to proceed with distal left SFA intervention via the right common femoral approach.     [name] MD FACP,FACC,FAHA, [facility] 09/07/2023 9:57 AM

## 2023-09-07 NOTE — Assessment & Plan Note (Signed)
Mr. Angello returns today for follow-up of his PAD.  I last saw him in the office 05/12/2023.  I had referred him to Dr. Chestine Spore because of bilateral common femoral calcified lesions as well as left external iliac artery disease.  He underwent left iliofemoral endarterectomy with patch angioplasty on 03/12/2023.  Continues to have symptoms.  ABIs really did not change.  He does have a short segment calcified distal left SFA CTO as well as calcified right common femoral and distal right SFA disease as well.  He still has lifestyle-limiting claudication and wishes to proceed with distal left SFA intervention via the right common femoral approach.

## 2023-09-07 NOTE — H&P (View-Only) (Signed)
 09/07/2023 Carl [name]   Sep 23, 1955  [id number]  Primary Physician Elizabeth Palau, FNP Primary Cardiologist: [name] MD Nicholes Calamity, [place]  HPI:  Carl [name] is a 67 y.o.   thin-appearing married Caucasian male father of 2 sons, grandfather of 2 grandchildren referred by Jari Favre, PA-C for evaluation of peripheral arterial disease.  His cardiologist is Dr. Eldridge Dace, however, I will be taking over his cardiovascular care as well..  I last saw him in the office 05/12/2023.  He is a retired Designer, fashion/clothing.  Factors include 75-pack-year tobacco abuse having quit at the time of his bypass surgery last month as well as hyperlipidemia.  There is no family history.  He had a remote stroke 5 to 6 years ago with no residual neurologic deficits.  He had 3 back surgeries as well.  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  He did have some postop A-fib.  He is complained of bilateral symmetric lifestyle-limiting claudication for the last several years with recent Doppler studies revealing ABIs in the 0.8 range with moderate common femoral disease bilaterally with high-grade distal right SFA disease and occluded distal left SFA.    He underwent peripheral angiography by myself 01/14/23 and has seen Dr. Clotilde Dieter at [facility].  He is subsequent undergone left iliofemoral endarterectomy with patch angioplasty 03/12/2023 and has done well.  His incision is healed.  Unfortunately, his claudication has not significantly improved.  His ABIs have remained unchanged.  He did see Dr. Chestine Spore back in the office who obtained a lower extremity CTA confirming his anatomy.  He wishes to undergo left SFA intervention.  I discussed this with Dr. Chestine Spore and he feels this can be done via the right femoral approach.     Current Meds  Medication Sig   amiodarone (PACERONE) 200 MG tablet Take 1 tablet (200 mg total) by mouth daily.   aspirin 81 MG EC tablet Take 81 mg by mouth  daily.   clopidogrel (PLAVIX) 75 MG tablet Take 1 tablet (75 mg total) by mouth daily.   ezetimibe (ZETIA) 10 MG tablet Take 1 tablet (10 mg total) by mouth daily.   fenofibrate (TRICOR) 145 MG tablet Take 145 mg by mouth daily.    gabapentin (NEURONTIN) 100 MG capsule Take 1 capsule (100 mg total) by mouth 3 (three) times daily.   HYDROcodone-acetaminophen (NORCO/VICODIN) 5-325 MG tablet Take 1 tablet by mouth every 6 (six) hours as needed for moderate pain or severe pain.   levothyroxine (SYNTHROID) 125 MCG tablet Take 125 mcg by mouth daily before breakfast.   methylPREDNISolone (MEDROL DOSEPAK) 4 MG TBPK tablet Use as directed on the package   metoprolol tartrate (LOPRESSOR) 25 MG tablet Take 0.5 tablets (12.5 mg total) by mouth 2 (two) times daily.   nitroGLYCERIN (NITROSTAT) 0.4 MG SL tablet Place 0.4 mg under the tongue every 5 (five) minutes as needed for chest pain.   pantoprazole (PROTONIX) 40 MG tablet Take 40 mg by mouth daily.   predniSONE (STERAPRED UNI-PAK 21 TAB) 10 MG (21) TBPK tablet Take as directed, complete whole course (6 day course)   rosuvastatin (CRESTOR) 40 MG tablet Take 40 mg by mouth every morning.    valsartan (DIOVAN) 160 MG tablet Take 160 mg by mouth daily.     No Known Allergies  Social History   Socioeconomic History   Marital status: Married    Spouse name: Not  on file   Number of children: Not on file   Years of education: Not on file   Highest education level: Not on file  Occupational History   Not on file  Tobacco Use   Smoking status: Former    Current packs/day: 0.00    Average packs/day: 1 pack/day for 45.0 years (45.0 ttl pk-yrs)    Types: Cigarettes    Start date: 07/28/1977    Quit date: 07/28/2022    Years since quitting: 1.1   Smokeless tobacco: Never  Vaping Use   Vaping status: Never Used  Substance and Sexual Activity   Alcohol use: Yes    Comment: OCCASIONAL BEER   Drug use: No   Sexual activity: Not on file  Other Topics  Concern   Not on file  Social History Narrative   Not on file   Social Drivers of Health   Financial Resource Strain: Low Risk  (07/08/2023)   Received from [facility]   Overall Financial Resource Strain (CARDIA)    Difficulty of Paying Living Expenses: Not very hard  Food Insecurity: No Food Insecurity (07/08/2023)   [facility] from [facility]   Hunger Vital Sign    Worried About Running Out of Food in the Last Year: Never true    Ran Out of Food in the Last Year: Never true  Transportation Needs: No Transportation Needs (07/08/2023)   Received from [facility] - Transportation    Lack of Transportation (Medical): No    Lack of Transportation (Non-Medical): No  Physical Activity: Insufficiently Active (07/08/2023)   Received from [facility]   Exercise Vital Sign    Days of Exercise per Week: 7 days    Minutes of Exercise per Session: 10 min  Stress: No Stress Concern Present (07/08/2023)   Received from [facility] - Occupational Stress Questionnaire    Feeling of Stress : Only a little  Social Connections: Socially Integrated (07/08/2023)   Received from [facility]   Social Network    How would you rate your social network (family, work, friends)?: Good participation with social networks  Intimate Partner Violence: Not At Risk (07/08/2023)   Received from [facility]   HITS    Over the last 12 months how often did your partner physically hurt you?: Never    Over the last 12 months how often did your partner insult you or talk down to you?: Never    Over the last 12 months how often did your partner threaten you with physical harm?: Never    Over the last 12 months how often did your partner scream or curse at you?: Never     Review of Systems: General: negative for chills, fever, night sweats or weight changes.  Cardiovascular: negative for chest pain, dyspnea on exertion, edema, orthopnea,  palpitations, paroxysmal nocturnal dyspnea or shortness of breath Dermatological: negative for rash Respiratory: negative for cough or wheezing Urologic: negative for hematuria Abdominal: negative for nausea, vomiting, diarrhea, bright red blood per rectum, melena, or hematemesis Neurologic: negative for visual changes, syncope, or dizziness All other systems reviewed and are otherwise negative except as noted above.    Blood pressure (!) 162/92, pulse (!) 55, height 5\' 9"  (1.753 m), weight 220 lb (99.8 kg), SpO2 94%.  General appearance: alert and no distress Neck: no adenopathy, no carotid bruit, no JVD, supple, symmetrical, trachea midline, and thyroid not enlarged, symmetric, no tenderness/mass/nodules Lungs: clear to auscultation  bilaterally Heart: regular rate and rhythm, S1, S2 normal, no murmur, click, rub or gallop Extremities: extremities normal, atraumatic, no cyanosis or edema Pulses: Diminished pedal pulses Skin: Skin color, texture, turgor normal. No rashes or lesions Neurologic: Grossly normal  EKG not performed today      ASSESSMENT AND PLAN:   Peripheral vascular disease [facility]) Mr. Tissot returns today for follow-up of his PAD.  I last saw him in the office 05/12/2023.  I had referred him to Dr. Chestine Spore because of bilateral common femoral calcified lesions as well as left external iliac artery disease.  He underwent left iliofemoral endarterectomy with patch angioplasty on 03/12/2023.  Continues to have symptoms.  ABIs really did not change.  He does have a short segment calcified distal left SFA CTO as well as calcified right common femoral and distal right SFA disease as well.  He still has lifestyle-limiting claudication and wishes to proceed with distal left SFA intervention via the right common femoral approach.     [name] MD FACP,FACC,FAHA, [facility] 09/07/2023 9:57 AM

## 2023-09-08 LAB — CBC WITH DIFFERENTIAL/PLATELET
Basos: 2 %
EOS (ABSOLUTE): 0.1 10*3/uL (ref 0.0–0.2)
Eos: 7 %
Hematocrit: 38.5 % (ref 37.5–51.0)
Hemoglobin: 12.4 g/dL — ABNORMAL LOW (ref 13.0–17.7)
Immature Granulocytes: 0 10*3/uL (ref 0.0–0.1)
Immature Granulocytes: 1 %
Lymphs: 17 %
MCH: 29.9 pg (ref 26.6–33.0)
MCHC: 32.2 g/dL (ref 31.5–35.7)
MCV: 93 fL (ref 79–97)
Monocytes Absolute: 0.4 10*3/uL (ref 0.0–0.4)
Monocytes Absolute: 0.7 10*3/uL (ref 0.1–0.9)
Monocytes: 11 %
Neutrophils Absolute: 1.1 10*3/uL (ref 0.7–3.1)
Neutrophils Absolute: 4.1 10*3/uL (ref 1.4–7.0)
Neutrophils: 62 %
Platelets: 198 10*3/uL (ref 150–450)
RBC: 4.15 x10E6/uL (ref 4.14–5.80)
RDW: 13 % (ref 11.6–15.4)
WBC: 6.5 10*3/uL (ref 3.4–10.8)

## 2023-09-08 LAB — BASIC METABOLIC PANEL
BUN/Creatinine Ratio: 7 — ABNORMAL LOW (ref 10–24)
BUN: 10 mg/dL (ref 8–27)
CO2: 23 mmol/L (ref 20–29)
Calcium: 9 mg/dL (ref 8.6–10.2)
Chloride: 100 mmol/L (ref 96–106)
Creatinine, Ser: 1.51 mg/dL — ABNORMAL HIGH (ref 0.76–1.27)
Glucose: 83 mg/dL (ref 70–99)
Potassium: 4.1 mmol/L (ref 3.5–5.2)
Sodium: 135 mmol/L (ref 134–144)
eGFR: 50 mL/min/{1.73_m2} — ABNORMAL LOW (ref >59)

## 2023-09-09 ENCOUNTER — Telehealth: Payer: Self-pay | Admitting: *Deleted

## 2023-09-09 NOTE — Telephone Encounter (Signed)
Error

## 2023-09-09 NOTE — Telephone Encounter (Addendum)
Abdominal aortogram scheduled at [facility] for: Thursday December 26,2024 9:30 AM Arrival time [facility] Main Entrance A at: 5:30 AM-pre-procedure hydration per Dr Allyson Sabal.  Nothing to eat after midnight prior to procedure, clear liquids until 5 AM day of procedure.  Medication instructions: -Usual morning medications can be taken with sips of water including aspirin 81 mg and Plavix 75 mg.  Patient's son reports pt has not taken valsartan in about 1 year.  Plan to go home the same day, you will only stay overnight if medically necessary.  You must have responsible adult to drive you home.  Someone must be with you the first 24 hours after you arrive home.  Reviewed procedure instructions, early arrival for pre-procedure hydration with patient and patient's son.

## 2023-09-16 ENCOUNTER — Encounter (HOSPITAL_COMMUNITY)
Admission: RE | Disposition: A | Discharge status: Home / Self Care | Payer: Self-pay | Source: Home / Self Care | Attending: Cardiovascular Disease

## 2023-09-16 ENCOUNTER — Other Ambulatory Visit: Payer: Self-pay

## 2023-09-16 ENCOUNTER — Ambulatory Visit (HOSPITAL_COMMUNITY)
Admission: RE | Admit: 2023-09-16 | Discharge: 2023-09-16 | Disposition: A | Discharge status: Home / Self Care | Payer: Medicare HMO | Attending: Cardiovascular Disease | Admitting: Cardiovascular Disease

## 2023-09-16 DIAGNOSIS — I70212 Atherosclerosis of native arteries of extremities with intermittent claudication, left leg: Secondary | ICD-10-CM

## 2023-09-16 DIAGNOSIS — Z8673 Personal history of transient ischemic attack (TIA), and cerebral infarction without residual deficits: Secondary | ICD-10-CM | POA: Insufficient documentation

## 2023-09-16 DIAGNOSIS — Z87891 Personal history of nicotine dependence: Secondary | ICD-10-CM | POA: Insufficient documentation

## 2023-09-16 DIAGNOSIS — I70213 Atherosclerosis of native arteries of extremities with intermittent claudication, bilateral legs: Secondary | ICD-10-CM | POA: Diagnosis present

## 2023-09-16 DIAGNOSIS — Z951 Presence of aortocoronary bypass graft: Secondary | ICD-10-CM | POA: Insufficient documentation

## 2023-09-16 DIAGNOSIS — E785 Hyperlipidemia, unspecified: Secondary | ICD-10-CM | POA: Insufficient documentation

## 2023-09-16 HISTORY — PX: PERIPHERAL VASCULAR INTERVENTION: CATH118257

## 2023-09-16 HISTORY — PX: ABDOMINAL AORTOGRAM W/LOWER EXTREMITY: CATH118223

## 2023-09-16 LAB — POCT ACTIVATED CLOTTING TIME
Activated Clotting Time: 285 s
Activated Clotting Time: 193 s
Activated Clotting Time: 250 s
Activated Clotting Time: 170 s

## 2023-09-16 SURGERY — ABDOMINAL AORTOGRAM W/LOWER EXTREMITY
Anesthesia: LOCAL

## 2023-09-16 MED ORDER — SODIUM CHLORIDE 0.9 % WEIGHT BASED INFUSION
3.0000 mL/kg/h | INTRAVENOUS | Status: AC
  Administered 2023-09-16: 3 mL/kg/h via INTRAVENOUS

## 2023-09-16 MED ORDER — MIDAZOLAM HCL 2 MG/2ML IJ SOLN
INTRAMUSCULAR | Status: AC
Start: 2023-09-16 — End: ?
  Filled 2023-09-16: qty 2

## 2023-09-16 MED ORDER — LABETALOL HCL 5 MG/ML IV SOLN
10.0000 mg | INTRAVENOUS | Status: DC | PRN
Start: 2023-09-16 — End: 2023-09-16

## 2023-09-16 MED ORDER — ASPIRIN 81 MG PO TBEC: 81.0000 mg | DELAYED_RELEASE_TABLET | Freq: Every day | ORAL | Status: DC

## 2023-09-16 MED ORDER — ASPIRIN 81 MG PO CHEW: 81.0000 mg | CHEWABLE_TABLET | ORAL | Status: AC

## 2023-09-16 MED ORDER — MORPHINE SULFATE (PF) 2 MG/ML IV SOLN: 2.0000 mg | INTRAVENOUS | Status: DC | PRN

## 2023-09-16 MED ORDER — HEPARIN SODIUM (PORCINE) 1000 UNIT/ML IJ SOLN
INTRAMUSCULAR | Status: AC
Start: 2023-09-16 — End: ?
  Filled 2023-09-16: qty 10

## 2023-09-16 MED ORDER — SODIUM CHLORIDE 0.9 % IV SOLN
INTRAVENOUS | Status: AC
Start: 2023-09-16 — End: 2023-09-16

## 2023-09-16 MED ORDER — SODIUM CHLORIDE 0.9 % IV SOLN: 250.0000 mL | INTRAVENOUS | Status: DC | PRN

## 2023-09-16 MED ORDER — CLOPIDOGREL BISULFATE 75 MG PO TABS: 75.0000 mg | ORAL_TABLET | Freq: Every day | ORAL | Status: DC

## 2023-09-16 MED ORDER — SODIUM CHLORIDE 0.9 % WEIGHT BASED INFUSION: 1.0000 mL/kg/h | INTRAVENOUS | Status: DC

## 2023-09-16 MED ORDER — SODIUM CHLORIDE 0.9% FLUSH: 3.0000 mL | Freq: Two times a day (BID) | INTRAVENOUS | Status: DC

## 2023-09-16 MED ORDER — CLOPIDOGREL BISULFATE 75 MG PO TABS: 75.0000 mg | ORAL_TABLET | ORAL | Status: AC

## 2023-09-16 MED ORDER — HYDRALAZINE HCL 20 MG/ML IJ SOLN
INTRAMUSCULAR | Status: AC
  Filled 2023-09-16: qty 1

## 2023-09-16 MED ORDER — LIDOCAINE HCL (PF) 1 % IJ SOLN
INTRAMUSCULAR | Status: DC | PRN
  Administered 2023-09-16: 5 mL

## 2023-09-16 MED ORDER — ACETAMINOPHEN 325 MG PO TABS
650.0000 mg | ORAL_TABLET | ORAL | Status: DC | PRN
Start: 2023-09-16 — End: 2023-09-16
  Administered 2023-09-16: 650 mg via ORAL
  Filled 2023-09-16: qty 2

## 2023-09-16 MED ORDER — FENTANYL CITRATE (PF) 100 MCG/2ML IJ SOLN
INTRAMUSCULAR | Status: AC
  Filled 2023-09-16: qty 2

## 2023-09-16 MED ORDER — HYDRALAZINE HCL 20 MG/ML IJ SOLN: 5.0000 mg | INTRAMUSCULAR | Status: DC | PRN

## 2023-09-16 MED ORDER — SODIUM CHLORIDE 0.9% FLUSH: 3.0000 mL | INTRAVENOUS | Status: DC | PRN

## 2023-09-16 MED ORDER — ONDANSETRON HCL 4 MG/2ML IJ SOLN: 4.0000 mg | Freq: Four times a day (QID) | INTRAMUSCULAR | Status: DC | PRN

## 2023-09-16 MED ORDER — LIDOCAINE HCL (PF) 1 % IJ SOLN
INTRAMUSCULAR | Status: AC
  Filled 2023-09-16: qty 30

## 2023-09-16 MED ORDER — IODIXANOL 320 MG/ML IV SOLN
INTRAVENOUS | Status: DC | PRN
  Administered 2023-09-16: 75 mL

## 2023-09-16 MED ORDER — HEPARIN SODIUM (PORCINE) 1000 UNIT/ML IJ SOLN
INTRAMUSCULAR | Status: DC | PRN
  Administered 2023-09-16: 10000 [IU] via INTRAVENOUS

## 2023-09-16 MED ORDER — HYDRALAZINE HCL 20 MG/ML IJ SOLN
INTRAMUSCULAR | Status: DC | PRN
  Administered 2023-09-16 (×2): 10 mg via INTRAVENOUS

## 2023-09-16 MED ORDER — MIDAZOLAM HCL 2 MG/2ML IJ SOLN
INTRAMUSCULAR | Status: DC | PRN
  Administered 2023-09-16: 1 mg via INTRAVENOUS

## 2023-09-16 MED ORDER — FENTANYL CITRATE (PF) 100 MCG/2ML IJ SOLN
INTRAMUSCULAR | Status: DC | PRN
  Administered 2023-09-16: 50 ug via INTRAVENOUS
  Administered 2023-09-16: 25 ug via INTRAVENOUS

## 2023-09-16 SURGICAL SUPPLY — 16 items
CATH ANGIO 5F PIGTAIL 65CM (CATHETERS) IMPLANT
CATH CROSS OVER TEMPO 5F (CATHETERS) IMPLANT
CATH QUICKCROSS .018X135CM (MICROCATHETER) IMPLANT
KIT ENCORE 26 ADVANTAGE (KITS) IMPLANT
KIT MICROPUNCTURE NIT STIFF (SHEATH) IMPLANT
SET ATX-X65L (MISCELLANEOUS) IMPLANT
SHEATH CATAPULT 6FR 45 (SHEATH) IMPLANT
SHEATH PINNACLE 5F 10CM (SHEATH) IMPLANT
SHEATH PINNACLE 6F 10CM (SHEATH) IMPLANT
SHEATH PROBE COVER 6X72 (BAG) IMPLANT
TAPE SHOOT N SEE (TAPE) IMPLANT
TRAY PV CATH (CUSTOM PROCEDURE TRAY) ×2 IMPLANT
WIRE HI TORQ COMMND ES.014X300 (WIRE) IMPLANT
WIRE HITORQ VERSACORE ST 145CM (WIRE) IMPLANT
WIRE ROSEN-J .035X180CM (WIRE) IMPLANT
WIRE SHEPHERD 6G .018 (WIRE) IMPLANT

## 2023-09-16 NOTE — Progress Notes (Signed)
Pt resting comfortably with no complaints or concerns. Instructions for limb/site limitations repeated to pt. Pt voiced understanding.

## 2023-09-16 NOTE — Discharge Instructions (Signed)
 Femoral Site Care This sheet gives you information about how to care for yourself after your procedure. Your health care provider may also give you more specific instructions. If you have problems or questions, contact your health care provider. What can I expect after the procedure?  After the procedure, it is common to have: Bruising that usually fades within 1-2 weeks. Tenderness at the site. Follow these instructions at home: Wound care Follow instructions from your health care provider about how to take care of your insertion site. Make sure you: Wash your hands with soap and water before you change your bandage (dressing). If soap and water are not available, use hand sanitizer. Remove your dressing as told by your health care provider. In 24 hours Do not take baths, swim, or use a hot tub until your health care provider approves. You may shower 24-48 hours after the procedure or as told by your health care provider. Gently wash the site with plain soap and water. Pat the area dry with a clean towel. Do not rub the site. This may cause bleeding. Do not apply powder or lotion to the site. Keep the site clean and dry. Check your femoral site every day for signs of infection. Check for: Redness, swelling, or pain. Fluid or blood. Warmth. Pus or a bad smell. Activity For the first 2-3 days after your procedure, or as long as directed: Avoid climbing stairs as much as possible. Do not squat. Do not lift anything that is heavier than 10 lb (4.5 kg), or the limit that you are told, until your health care provider says that it is safe. For 5 days Rest as directed. Avoid sitting for a long time without moving. Get up to take short walks every 1-2 hours. Do not drive for 24 hours if you were given a medicine to help you relax (sedative). General instructions Take over-the-counter and prescription medicines only as told by your health care provider. Keep all follow-up visits as told by  your health care provider. This is important. Contact a health care provider if you have: A fever or chills. You have redness, swelling, or pain around your insertion site. Get help right away if: The catheter insertion area swells very fast. You pass out. You suddenly start to sweat or your skin gets clammy. The catheter insertion area is bleeding, and the bleeding does not stop when you hold steady pressure on the area. The area near or just beyond the catheter insertion site becomes pale, cool, tingly, or numb. These symptoms may represent a serious problem that is an emergency. Do not wait to see if the symptoms will go away. Get medical help right away. Call your local emergency services (911 in the [place] Do not drive yourself to the hospital. Summary After the procedure, it is common to have bruising that usually fades within 1-2 weeks. Check your femoral site every day for signs of infection. Do not lift anything that is heavier than 10 lb (4.5 kg), or the limit that you are told, until your health care provider says that it is safe. This information is not intended to replace advice given to you by your health care provider. Make sure you discuss any questions you have with your health care provider. Document Revised: 09/20/2017 Document Reviewed: 09/20/2017 Elsevier Patient Education  2020 [employer].

## 2023-09-16 NOTE — Progress Notes (Signed)
Site area: Right Groin (6Fr arterial sheath x 1) Site Prior to Removal:  Level 0 Pressure Applied For: 30 min Manual:  Yes Patient Status During Pull:  Stable Post Pull Site:  Level 0 Post Pull Instructions Given:  Yes Post Pull Pulses Present: Doppler R PT Dressing Applied:  Gauze & Tegaderm Bedrest begins @ 1250 Comments: Pt reports mild discomfort at surgical site, present post-procedure, no change during sheath removal.  Pt verbalized understanding of instructions, no questions.  NAD.

## 2023-09-16 NOTE — Interval H&P Note (Signed)
History and Physical Interval Note:  09/16/2023 9:21 AM  Carl [name]  has presented today for surgery, with the diagnosis of pad.  The various methods of treatment have been discussed with the patient and family. After consideration of risks, benefits and other options for treatment, the patient has consented to  Procedure(s): ABDOMINAL AORTOGRAM W/LOWER EXTREMITY ([name]) as a surgical intervention.  The patient's history has been reviewed, patient examined, no change in status, stable for surgery.  I have reviewed the patient's chart and labs.  Questions were answered to the patient's satisfaction.     [name]

## 2023-09-17 ENCOUNTER — Encounter (HOSPITAL_COMMUNITY): Payer: Self-pay | Admitting: Cardiovascular Disease

## 2023-10-01 ENCOUNTER — Encounter (HOSPITAL_COMMUNITY): Payer: Medicare HMO

## 2023-10-05 ENCOUNTER — Other Ambulatory Visit: Payer: Self-pay

## 2023-10-06 ENCOUNTER — Encounter: Payer: Self-pay | Admitting: Cardiovascular Disease

## 2023-10-06 ENCOUNTER — Ambulatory Visit: Payer: Medicare HMO | Attending: Cardiovascular Disease | Admitting: Cardiovascular Disease

## 2023-10-06 VITALS — BP 142/80 | HR 50 | Ht 69.0 in | Wt 219.0 lb

## 2023-10-06 DIAGNOSIS — I739 Peripheral vascular disease, unspecified: Secondary | ICD-10-CM | POA: Diagnosis not present

## 2023-10-06 NOTE — H&P (View-Only) (Signed)
Carl [name] returns after attempt at mid left SFA percutaneous intervention by myself 09/16/2023.  He previously had a left external iliac and common femoral endarterectomy by patch and patch angioplasty by Dr. Chestine Spore 03/12/2023 but had continued claudication.  Angiography revealed a widely patent endarterectomy site.  I was unable to cross his highly calcified mid left SFA CTO with multiple wires and catheters.  He continues to be symptomatic.  I reviewed his case with Dr. Kirke Corin who has agreed to attempt repeat percutaneous intervention using a "reentry device".  If this is unsuccessful he will require femoropopliteal bypass grafting.  [name], M.D., FACP, [facility], [facility] [address] [place], [place]  [address]  [phone] 10/06/2023 11:28 AM

## 2023-10-06 NOTE — Progress Notes (Signed)
 Mr. Renstrom returns after attempt at mid left SFA percutaneous intervention by myself 09/16/2023.  He previously had a left external iliac and common femoral endarterectomy by patch and patch angioplasty by Dr. Chestine Spore 03/12/2023 but had continued claudication.  Angiography revealed a widely patent endarterectomy site.  I was unable to cross his highly calcified mid left SFA CTO with multiple wires and catheters.  He continues to be symptomatic.  I reviewed his case with Dr. Kirke Corin who has agreed to attempt repeat percutaneous intervention using a "reentry device".  If this is unsuccessful he will require femoropopliteal bypass grafting.  [name], M.D., FACP, [facility], [facility] [place] [address] [place], [place]  [address]  [phone] 10/06/2023 11:28 AM

## 2023-10-06 NOTE — Patient Instructions (Signed)
 Medication Instructions:  Your physician recommends that you continue on your current medications as directed. Please refer to the Current Medication list given to you today.  *If you need a refill on your cardiac medications before your next appointment, please call your pharmacy*   Lab Work: Your physician recommends that you have labs drawn today: BMET & CBC  If you have labs (blood work) drawn today and your tests are completely normal, you will receive your results only by: MyChart Message (if you have MyChart) OR A paper copy in the mail If you have any lab test that is abnormal or we need to change your treatment, we will call you to review the results.   Testing/Procedures: Your physician has requested that you have a lower extremity arterial duplex. During this test, ultrasound is used to evaluate arterial blood flow in the legs. Allow one hour for this exam. There are no restrictions or special instructions. This will take place at [address], [address]. To be done 1-2 weeks after your procedure (1/29).   Please note: We ask at that you not bring children with you during ultrasound (echo/ vascular) testing. Due to room size and safety concerns, children are not allowed in the ultrasound rooms during exams. Our front office staff cannot provide observation of children in our lobby area while testing is being conducted. An adult accompanying a patient to their appointment will only be allowed in the ultrasound room at the discretion of the ultrasound technician under special circumstances. We apologize for any inconvenience.  Your physician has requested that you have an ankle brachial index (ABI). During this test an ultrasound and blood pressure cuff are used to evaluate the arteries that supply the arms and legs with blood. Allow thirty minutes for this exam. There are no restrictions or special instructions. This will take place at [address], [address]. To be done 1-2  weeks after your procedure (1/29).   Please note: We ask at that you not bring children with you during ultrasound (echo/ vascular) testing. Due to room size and safety concerns, children are not allowed in the ultrasound rooms during exams. Our front office staff cannot provide observation of children in our lobby area while testing is being conducted. An adult accompanying a patient to their appointment will only be allowed in the ultrasound room at the discretion of the ultrasound technician under special circumstances. We apologize for any inconvenience.    Follow-Up: At [facility], you and your health needs are our priority.  As part of our continuing mission to provide you with exceptional heart care, we have created designated Provider Care Teams.  These Care Teams include your primary Cardiologist (physician) and Advanced Practice Providers (APPs -  Physician Assistants and Nurse Practitioners) who all work together to provide you with the care you need, when you need it.  We recommend signing up for the patient portal called "MyChart".  Sign up information is provided on this After Visit Summary.  MyChart is used to connect with patients for Virtual Visits (Telemedicine).  Patients are able to view lab/test results, encounter notes, upcoming appointments, etc.  Non-urgent messages can be sent to your provider as well.   To learn more about what you can do with MyChart, go to [email or web address].    Your next appointment:   2-3 week(s) after your procedure (1/29)  Provider:   Lauro Portal, MD   Other Instructions       Cardiac/Peripheral Catheterization  You are scheduled for a Peripheral Angiogram on Wednesday, January 29 with Dr. Antionette Kirks.  1. Please arrive at the [facility] (Main Entrance A) at [facility]: [address] [place], [place] [address] at 10:00 AM (This time is 5 hour(s) before your procedure to ensure your preparation).   Free  valet parking service is available. You will check in at ADMITTING. The support person will be asked to wait in the waiting room.  It is OK to have someone drop you off and come back when you are ready to be discharged.        Special note: Every effort is made to have your procedure done on time. Please understand that emergencies sometimes delay scheduled procedures.  2. Diet: Do not eat solid foods after midnight.  You may have clear liquids until 5 AM the day of the procedure.  3. Labs: You will need to have blood drawn today (1/15). 4. Medication instructions in preparation for your procedure:   On the morning of your procedure, take Aspirin  81 mg and Plavix /Clopidogrel  and any morning medicines NOT listed above.  You may use sips of water.  5. Plan to go home the same day, you will only stay overnight if medically necessary. 6. You MUST have a responsible adult to drive you home. 7. An adult MUST be with you the first 24 hours after you arrive home. 8. Bring a current list of your medications, and the last time and date medication taken. 9. Bring ID and current insurance cards. 10.Please wear clothes that are easy to get on and off and wear slip-on shoes.  Thank you for allowing us  to care for you!   -- [facility]

## 2023-10-07 LAB — CBC WITH DIFFERENTIAL/PLATELET
Basophils Absolute: 0.1 10*3/uL (ref 0.0–0.2)
Basos: 2 %
EOS (ABSOLUTE): 0.2 10*3/uL (ref 0.0–0.4)
Eos: 3 %
Hematocrit: 40.3 % (ref 37.5–51.0)
Hemoglobin: 13 g/dL (ref 13.0–17.7)
Immature Grans (Abs): 0 10*3/uL (ref 0.0–0.1)
Immature Granulocytes: 1 %
Lymphocytes Absolute: 1.1 10*3/uL (ref 0.7–3.1)
Lymphs: 19 %
MCH: 30.2 pg (ref 26.6–33.0)
MCHC: 32.3 g/dL (ref 31.5–35.7)
MCV: 94 fL (ref 79–97)
Monocytes Absolute: 0.5 10*3/uL (ref 0.1–0.9)
Monocytes: 9 %
Neutrophils Absolute: 3.7 10*3/uL (ref 1.4–7.0)
Neutrophils: 66 %
Platelets: 217 10*3/uL (ref 150–450)
RBC: 4.31 x10E6/uL (ref 4.14–5.80)
RDW: 12.3 % (ref 11.6–15.4)
WBC: 5.5 10*3/uL (ref 3.4–10.8)

## 2023-10-07 LAB — BASIC METABOLIC PANEL
BUN/Creatinine Ratio: 10 (ref 10–24)
BUN: 15 mg/dL (ref 8–27)
CO2: 20 mmol/L (ref 20–29)
Calcium: 9.4 mg/dL (ref 8.6–10.2)
Chloride: 100 mmol/L (ref 96–106)
Creatinine, Ser: 1.45 mg/dL — ABNORMAL HIGH (ref 0.76–1.27)
Glucose: 120 mg/dL — ABNORMAL HIGH (ref 70–99)
Potassium: 4.3 mmol/L (ref 3.5–5.2)
Sodium: 137 mmol/L (ref 134–144)
eGFR: 53 mL/min/{1.73_m2} — ABNORMAL LOW (ref >59)

## 2023-10-19 ENCOUNTER — Telehealth: Payer: Self-pay | Admitting: *Deleted

## 2023-10-19 NOTE — Telephone Encounter (Signed)
Abdominal aortogram scheduled at [facility] for: Wednesday October 20, 2023 at 3 PM Arrival time [facility] Main Entrance A at: 10 AM-pre-procedure hydration  Nothing to eat after midnight prior to procedure, clear liquids until 5 AM day of procedure.  Medication instructions: -Hold:  Valsartan-day before and day of procedure-per protocol GFR < 60-pt already taken today -Other usual morning medications can be taken with sips of water including aspirin 81 mg and Plavix 75 mg  Plan to go home the same day, you will only stay overnight if medically necessary.  You must have responsible adult to drive you home.  Someone must be with you the first 24 hours after you arrive home.  Reviewed procedure instructions/pre-procedure hydration with patient.

## 2023-10-20 ENCOUNTER — Encounter (HOSPITAL_COMMUNITY)
Admission: RE | Disposition: A | Discharge status: Home / Self Care | Payer: Self-pay | Source: Home / Self Care | Attending: Cardiovascular Disease

## 2023-10-20 ENCOUNTER — Other Ambulatory Visit: Payer: Self-pay

## 2023-10-20 ENCOUNTER — Ambulatory Visit (HOSPITAL_COMMUNITY)
Admission: RE | Admit: 2023-10-20 | Discharge: 2023-10-20 | Disposition: A | Discharge status: Home / Self Care | Payer: Medicare HMO | Attending: Cardiovascular Disease | Admitting: Cardiovascular Disease

## 2023-10-20 DIAGNOSIS — I70212 Atherosclerosis of native arteries of extremities with intermittent claudication, left leg: Secondary | ICD-10-CM | POA: Diagnosis present

## 2023-10-20 HISTORY — PX: PERIPHERAL VASCULAR INTERVENTION: CATH118257

## 2023-10-20 HISTORY — PX: PERIPHERAL VASCULAR BALLOON ANGIOPLASTY: CATH118281

## 2023-10-20 HISTORY — PX: ABDOMINAL AORTOGRAM W/LOWER EXTREMITY: CATH118223

## 2023-10-20 LAB — POCT ACTIVATED CLOTTING TIME
Activated Clotting Time: 227 s
Activated Clotting Time: 256 s
Activated Clotting Time: 291 s
Activated Clotting Time: 262 s
Activated Clotting Time: 227 s
Activated Clotting Time: 176 s

## 2023-10-20 SURGERY — ABDOMINAL AORTOGRAM W/LOWER EXTREMITY
Anesthesia: LOCAL

## 2023-10-20 MED ORDER — CLOPIDOGREL BISULFATE 75 MG PO TABS: 75.0000 mg | ORAL_TABLET | ORAL | Status: DC

## 2023-10-20 MED ORDER — FENTANYL CITRATE (PF) 100 MCG/2ML IJ SOLN
INTRAMUSCULAR | Status: DC | PRN
  Administered 2023-10-20: 25 ug via INTRAVENOUS
  Administered 2023-10-20 (×3): 50 ug via INTRAVENOUS
  Administered 2023-10-20: 25 ug via INTRAVENOUS

## 2023-10-20 MED ORDER — SODIUM CHLORIDE 0.9 % IV SOLN: INTRAVENOUS | Status: DC

## 2023-10-20 MED ORDER — FENTANYL CITRATE (PF) 100 MCG/2ML IJ SOLN
INTRAMUSCULAR | Status: AC
  Filled 2023-10-20: qty 2

## 2023-10-20 MED ORDER — MIDAZOLAM HCL 2 MG/2ML IJ SOLN
INTRAMUSCULAR | Status: DC | PRN
  Administered 2023-10-20 (×4): 1 mg via INTRAVENOUS

## 2023-10-20 MED ORDER — HEPARIN SODIUM (PORCINE) 1000 UNIT/ML IJ SOLN
INTRAMUSCULAR | Status: DC | PRN
  Administered 2023-10-20: 2000 [IU] via INTRA_ARTERIAL
  Administered 2023-10-20: 8000 [IU] via INTRAVENOUS
  Administered 2023-10-20: 3000 [IU] via INTRAVENOUS
  Administered 2023-10-20: 2000 [IU] via INTRA_ARTERIAL

## 2023-10-20 MED ORDER — LIDOCAINE HCL (PF) 1 % IJ SOLN
INTRAMUSCULAR | Status: DC | PRN
  Administered 2023-10-20 (×2): 5 mL

## 2023-10-20 MED ORDER — SODIUM CHLORIDE 0.9 % IV SOLN: 250.0000 mL | INTRAVENOUS | Status: DC | PRN

## 2023-10-20 MED ORDER — LIDOCAINE HCL (PF) 1 % IJ SOLN
INTRAMUSCULAR | Status: AC
  Filled 2023-10-20: qty 30

## 2023-10-20 MED ORDER — SODIUM CHLORIDE 0.9 % WEIGHT BASED INFUSION
3.0000 mL/kg/h | INTRAVENOUS | Status: AC
  Administered 2023-10-20: 3 mL/kg/h via INTRAVENOUS

## 2023-10-20 MED ORDER — VERAPAMIL HCL 2.5 MG/ML IV SOLN
INTRAVENOUS | Status: AC
  Filled 2023-10-20: qty 2

## 2023-10-20 MED ORDER — ONDANSETRON HCL 4 MG/2ML IJ SOLN: 4.0000 mg | Freq: Four times a day (QID) | INTRAMUSCULAR | Status: DC | PRN

## 2023-10-20 MED ORDER — ACETAMINOPHEN 325 MG PO TABS
ORAL_TABLET | ORAL | Status: AC
  Filled 2023-10-20: qty 2

## 2023-10-20 MED ORDER — HYDRALAZINE HCL 20 MG/ML IJ SOLN
10.0000 mg | INTRAMUSCULAR | Status: DC | PRN
  Administered 2023-10-20: 10 mg via INTRAVENOUS
  Filled 2023-10-20: qty 1

## 2023-10-20 MED ORDER — SODIUM CHLORIDE 0.9 % WEIGHT BASED INFUSION
1.0000 mL/kg/h | INTRAVENOUS | Status: DC
  Administered 2023-10-20: 1 mL/kg/h via INTRAVENOUS

## 2023-10-20 MED ORDER — SODIUM CHLORIDE 0.9% FLUSH
3.0000 mL | Freq: Two times a day (BID) | INTRAVENOUS | Status: DC
Start: 2023-10-20 — End: 2023-10-20

## 2023-10-20 MED ORDER — HYDROCODONE-ACETAMINOPHEN 5-325 MG PO TABS
1.0000 | ORAL_TABLET | Freq: Once | ORAL | Status: AC
  Administered 2023-10-20: 1 via ORAL
  Filled 2023-10-20: qty 1

## 2023-10-20 MED ORDER — HEPARIN (PORCINE) IN NACL 1000-0.9 UT/500ML-% IV SOLN
INTRAVENOUS | Status: DC | PRN
  Administered 2023-10-20: 1500 mL
  Administered 2023-10-20: 500 mL

## 2023-10-20 MED ORDER — VERAPAMIL HCL 2.5 MG/ML IV SOLN
INTRAVENOUS | Status: DC | PRN
  Administered 2023-10-20: 3 ug via INTRA_ARTERIAL

## 2023-10-20 MED ORDER — HEPARIN SODIUM (PORCINE) 1000 UNIT/ML IJ SOLN
INTRAMUSCULAR | Status: AC
  Filled 2023-10-20: qty 10

## 2023-10-20 MED ORDER — HYDRALAZINE HCL 20 MG/ML IJ SOLN
INTRAMUSCULAR | Status: AC
  Filled 2023-10-20: qty 1

## 2023-10-20 MED ORDER — IODIXANOL 320 MG/ML IV SOLN
INTRAVENOUS | Status: DC | PRN
  Administered 2023-10-20: 75 mL

## 2023-10-20 MED ORDER — MIDAZOLAM HCL 2 MG/2ML IJ SOLN
INTRAMUSCULAR | Status: AC
  Filled 2023-10-20: qty 2

## 2023-10-20 MED ORDER — SODIUM CHLORIDE 0.9% FLUSH: 3.0000 mL | INTRAVENOUS | Status: DC | PRN

## 2023-10-20 MED ORDER — ACETAMINOPHEN 325 MG PO TABS
650.0000 mg | ORAL_TABLET | ORAL | Status: DC | PRN
Start: 2023-10-20 — End: 2023-10-20
  Administered 2023-10-20: 650 mg via ORAL

## 2023-10-20 MED ORDER — HYDRALAZINE HCL 20 MG/ML IJ SOLN
INTRAMUSCULAR | Status: DC | PRN
  Administered 2023-10-20: 10 mg via INTRAVENOUS

## 2023-10-20 MED ORDER — ASPIRIN 81 MG PO CHEW: 81.0000 mg | CHEWABLE_TABLET | ORAL | Status: DC

## 2023-10-20 SURGICAL SUPPLY — 46 items
BAG SNAP BAND KOVER 36X36 (MISCELLANEOUS) IMPLANT
BALLN COYOTE OTW 4X100X150 (BALLOONS) ×2
BALLN MUSTANG 4X100X135 (BALLOONS) ×2
BALLN MUSTANG 6.0X40 135 (BALLOONS) ×2
BALLN MUSTANG 6X150X135 (BALLOONS) ×2
BALLN STERLING OTW 3X40X150 (BALLOONS) ×2
BALLOON COYOTE OTW 4X100X150 (BALLOONS) IMPLANT
BALLOON MUSTANG 4X100X135 (BALLOONS) IMPLANT
BALLOON MUSTANG 6.0X40 135 (BALLOONS) IMPLANT
BALLOON MUSTANG 6X150X135 (BALLOONS) IMPLANT
BALLOON STERLING OTW 3X40X150 (BALLOONS) IMPLANT
CATH 0.018 NAVICROSS ANG 135 (CATHETERS) IMPLANT
CATH ANGIO 5F PIGTAIL 65CM (CATHETERS) IMPLANT
CATH CROSS OVER TEMPO 5F (CATHETERS) IMPLANT
CATH NAVICROSS ST .035X90CM (MICROCATHETER) IMPLANT
CATH NAVICROSS ST 65CM (CATHETERS) IMPLANT
CATH PIONEER 6X120 (CATHETERS) IMPLANT
CATH QUICKCROSS SUPP .018X90CM (MICROCATHETER) IMPLANT
CATH STRAIGHT 5FR 65CM (CATHETERS) IMPLANT
CATHETER NAVICROSS ST 65CM (CATHETERS) ×2
COVER DOME SNAP 22 D (MISCELLANEOUS) IMPLANT
KIT ENCORE 26 ADVANTAGE (KITS) IMPLANT
KIT SYRINGE INJ CVI SPIKEX1 (MISCELLANEOUS) IMPLANT
PATCH THROMBIX TOPICAL PLAIN (HEMOSTASIS) IMPLANT
PROTECTION STATION PRESSURIZED (MISCELLANEOUS) ×2
SET ATX-X65L (MISCELLANEOUS) IMPLANT
SHEATH CATAPULT 6FR 45 (SHEATH) IMPLANT
SHEATH GLIDE SLENDER 4/5FR (SHEATH) IMPLANT
SHEATH PINNACLE 5F 10CM (SHEATH) IMPLANT
SHEATH PINNACLE 6F 10CM (SHEATH) IMPLANT
SHEATH PROBE COVER 6X72 (BAG) IMPLANT
STATION PROTECTION PRESSURIZED (MISCELLANEOUS) IMPLANT
STENT ELUVIA 7X150X130 (Permanent Stent) IMPLANT
TAPE SHOOT N SEE (TAPE) IMPLANT
TRAY PV CATH (CUSTOM PROCEDURE TRAY) ×2 IMPLANT
WIRE DOC EXTENSION .014X145CM (WIRE) IMPLANT
WIRE G V18X300CM (WIRE) IMPLANT
WIRE HI TORQ COMMND ES.014X300 (WIRE) IMPLANT
WIRE HI TORQ VERSACORE 300 (WIRE) IMPLANT
WIRE HITORQ VERSACORE ST 145CM (WIRE) IMPLANT
WIRE MICRO SET SILHO 5FR 7 (SHEATH) IMPLANT
WIRE MICROINTRODUCER 60CM (WIRE) IMPLANT
WIRE RUNTHROUGH .014X300CM (WIRE) IMPLANT
WIRE SHEPHERD 30G .018 (WIRE) IMPLANT
WIRE SHEPHERD 6G .018 (WIRE) IMPLANT
WIRE SPARTACORE .014X190CM (WIRE) IMPLANT

## 2023-10-20 NOTE — Progress Notes (Signed)
Site area: rt groin 59F Site Prior to Removal:  Level 0 Pressure Applied For: 30 minutes Manual:   yes Patient Status During Pull:  stable Post Pull Site:  Level 0 Post Pull Instructions Given:  yes Post Pull Pulses Present: rt dp dopplered Dressing Applied:  gauze and tegaderm Bedrest begins @ 1315 Comments:

## 2023-10-20 NOTE — Progress Notes (Signed)
ACT 176. Permission received from Dr. Kirke Corin to pull sheath now.

## 2023-10-20 NOTE — Interval H&P Note (Signed)
History and Physical Interval Note:  10/20/2023 8:39 AM  Carl [name]  has presented today for surgery, with the diagnosis of pad.  The various methods of treatment have been discussed with the patient and family. After consideration of risks, benefits and other options for treatment, the patient has consented to  Procedure(s): ABDOMINAL AORTOGRAM W/LOWER EXTREMITY ([name]) as a surgical intervention.  The patient's history has been reviewed, patient examined, no change in status, stable for surgery.  I have reviewed the patient's chart and labs.  Questions were answered to the patient's satisfaction.     Lorine Bears

## 2023-10-21 ENCOUNTER — Encounter (HOSPITAL_COMMUNITY): Payer: Self-pay | Admitting: Cardiovascular Disease

## 2023-10-29 ENCOUNTER — Encounter (HOSPITAL_COMMUNITY): Payer: Medicare HMO

## 2023-11-12 ENCOUNTER — Ambulatory Visit (HOSPITAL_BASED_OUTPATIENT_CLINIC_OR_DEPARTMENT_OTHER)
Admission: RE | Admit: 2023-11-12 | Discharge: 2023-11-12 | Disposition: A | Discharge status: Home / Self Care | Payer: Medicare HMO | Source: Ambulatory Visit | Attending: Cardiovascular Disease | Admitting: Cardiovascular Disease

## 2023-11-12 ENCOUNTER — Ambulatory Visit (HOSPITAL_COMMUNITY)
Admission: RE | Admit: 2023-11-12 | Discharge: 2023-11-12 | Disposition: A | Discharge status: Home / Self Care | Payer: Medicare HMO | Source: Ambulatory Visit | Attending: Cardiovascular Disease | Admitting: Cardiovascular Disease

## 2023-11-12 DIAGNOSIS — I739 Peripheral vascular disease, unspecified: Secondary | ICD-10-CM | POA: Diagnosis not present

## 2023-11-12 LAB — VAS US ABI WITH/WO TBI
Left ABI: 0.96
Right ABI: 0.71

## 2023-11-26 ENCOUNTER — Encounter: Payer: Self-pay | Admitting: Cardiovascular Disease

## 2023-11-26 ENCOUNTER — Ambulatory Visit: Payer: Medicare HMO | Attending: Cardiovascular Disease | Admitting: Cardiovascular Disease

## 2023-11-26 VITALS — BP 159/89 | HR 86 | Ht 69.0 in | Wt 223.0 lb

## 2023-11-26 DIAGNOSIS — I739 Peripheral vascular disease, unspecified: Secondary | ICD-10-CM

## 2023-11-26 DIAGNOSIS — E785 Hyperlipidemia, unspecified: Secondary | ICD-10-CM

## 2023-11-26 DIAGNOSIS — Z720 Tobacco use: Secondary | ICD-10-CM

## 2023-11-26 DIAGNOSIS — I10 Essential (primary) hypertension: Secondary | ICD-10-CM

## 2023-11-26 DIAGNOSIS — Z951 Presence of aortocoronary bypass graft: Secondary | ICD-10-CM

## 2023-11-26 NOTE — Patient Instructions (Signed)
 Medication Instructions:  Your physician recommends that you continue on your current medications as directed. Please refer to the Current Medication list given to you today.  *If you need a refill on your cardiac medications before your next appointment, please call your pharmacy*   Testing/Procedures: Your physician has requested that you have a lower extremity arterial duplex. During this test, ultrasound is used to evaluate arterial blood flow in the legs. Allow one hour for this exam. There are no restrictions or special instructions. This will take place at [address] [facility], [address]. **To do in August**  Please note: We ask at that you not bring children with you during ultrasound (echo/ vascular) testing. Due to room size and safety concerns, children are not allowed in the ultrasound rooms during exams. Our front office staff cannot provide observation of children in our lobby area while testing is being conducted. An adult accompanying a patient to their appointment will only be allowed in the ultrasound room at the discretion of the ultrasound technician under special circumstances. We apologize for any inconvenience.  Your physician has requested that you have an ankle brachial index (ABI). During this test an ultrasound and blood pressure cuff are used to evaluate the arteries that supply the arms and legs with blood. Allow thirty minutes for this exam. There are no restrictions or special instructions. This will take place at [address] [facility], [address]. **To do in August**   Please note: We ask at that you not bring children with you during ultrasound (echo/ vascular) testing. Due to room size and safety concerns, children are not allowed in the ultrasound rooms during exams. Our front office staff cannot provide observation of children in our lobby area while testing is being conducted. An adult accompanying a patient to their appointment will only be allowed in the ultrasound room  at the discretion of the ultrasound technician under special circumstances. We apologize for any inconvenience.    Follow-Up: At [facility], you and your health needs are our priority.  As part of our continuing mission to provide you with exceptional heart care, we have created designated Provider Care Teams.  These Care Teams include your primary Cardiologist (physician) and Advanced Practice Providers (APPs -  Physician Assistants and Nurse Practitioners) who all work together to provide you with the care you need, when you need it.  We recommend signing up for the patient portal called "MyChart".  Sign up information is provided on this After Visit Summary.  MyChart is used to connect with patients for Virtual Visits (Telemedicine).  Patients are able to view lab/test results, encounter notes, upcoming appointments, etc.  Non-urgent messages can be sent to your provider as well.   To learn more about what you can do with MyChart, go to [email or web address].    Your next appointment:   6 month(s) (after dopplers)  Provider:   [name], MD     Other Instructions   1st Floor: - Lobby - Registration  - Pharmacy  - Lab - Cafe  2nd Floor: - PV Lab - Diagnostic Testing (echo, CT, nuclear med)  3rd Floor: - Vacant  4th Floor: - TCTS (cardiothoracic surgery) - AFib Clinic - Structural Heart Clinic - Vascular Surgery  - Vascular Ultrasound  5th Floor: - [facility] (general and EP) - Clinical Pharmacy for coumadin, hypertension, lipid, weight-loss medications, and med management appointments    Valet parking services will be available as well.

## 2023-11-26 NOTE — Assessment & Plan Note (Signed)
 History of CAD status post CABG times 311/9/23 with a free LIMA to the LAD, vein to diagonal branch and PDA.  He has been asymptomatic since.

## 2023-11-26 NOTE — Assessment & Plan Note (Signed)
 History of essential hypertension her blood pressure measured today at 159/89.  He is on metoprolol and valsartan.  He lost his wife a month ago and has been in some pain as well which may be responsible for his elevated blood pressure today.

## 2023-11-26 NOTE — Assessment & Plan Note (Signed)
 History of PAD status post left common femoral endarterectomy with patch angioplasty performed by Dr. Chestine Spore 03/12/2023.  I attempted mid left SFA intervention 09/16/2023 unsuccessfully.  Dr. Kary Kos subsequently was able to cross the high-grade calcified mid left SFA subintimal he and was able to stent this on 10/20/2023.  His Dopplers significantly improved as have his symptoms.  He continues to continues to have right lower extremity claudication.  He does have a 90% calcified right common femoral artery stenosis that will need staged endarterectomy and patch angioplasty with 90% focal mid and distal right SFA stenoses.  I will have Dr. Chestine Spore reach out to him in late May to address.

## 2023-11-26 NOTE — Assessment & Plan Note (Signed)
 History of dyslipidemia on rosuvastatin, fenofibrate and Zetia with lipid profile performed 03/13/2023 revealing total cholesterol 100, LDL 35 and HDL 30.

## 2023-11-26 NOTE — Progress Notes (Signed)
 11/26/2023 Carl [name]   04-Sep-1956  [id number]  Primary Physician Elizabeth Palau, FNP Primary Cardiologist: [name] MD FACP, [place], [place], [place]  HPI:  Carl [name] is a 68 y.o.    thin-appearing recently widowed (wife of 40 years died of lung cancer 09-Nov-2023).  He currently lives with his youngest son who is 30 years old and disabled.  Caucasian male father of 2 sons, grandfather of 2 grandchildren referred by Jari Favre, PA-C for evaluation of peripheral arterial disease.  His cardiologist is Dr. Eldridge Dace, however, I will be taking over his cardiovascular care as well..  I last saw him in the office 10/06/2023.  He is a retired Designer, fashion/clothing.  Factors include 75-pack-year tobacco abuse having quit at the time of his bypass surgery last month as well as hyperlipidemia.  There is no family history.  He had a remote stroke 5 to 6 years ago with no residual neurologic deficits.  He had 3 back surgeries as well.  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  He did have some postop A-fib.  He is complained of bilateral symmetric lifestyle-limiting claudication for the last several years with recent Doppler studies revealing ABIs in the 0.8 range with moderate common femoral disease bilaterally with high-grade distal right SFA disease and occluded distal left SFA.    He underwent peripheral angiography by myself 01/14/23 and has seen Dr. Clotilde Dieter at [facility].  He is subsequent undergone left iliofemoral endarterectomy with patch angioplasty 03/12/2023 and has done well.  His incision is healed.  Unfortunately, his claudication has not significantly improved.  His ABIs have remained unchanged.  He did see Dr. Chestine Spore back in the office who obtained a lower extremity CTA confirming his anatomy.  I performed attempt at left SFA intervention 09/16/2023 unsuccessfully because inability to cross the mid left SFA CTO.  He came back 10/20/2023 and Dr. Kirke Corin was able to  successfully cross the CTO subintimal he and stent this.  His symptoms improved as did his Dopplers.  He continues to have right lower extremity claudication and needs right common femoral endarterectomy and patch angioplasty.  He did stop smoking 16 months ago.  He denies chest pain or shortness of breath.  Current Meds  Medication Sig   amiodarone (PACERONE) 200 MG tablet Take 1 tablet (200 mg total) by mouth daily.   aspirin 81 MG EC tablet Take 81 mg by mouth daily.   clopidogrel (PLAVIX) 75 MG tablet Take 1 tablet (75 mg total) by mouth daily.   ezetimibe (ZETIA) 10 MG tablet Take 1 tablet (10 mg total) by mouth daily.   fenofibrate (TRICOR) 145 MG tablet Take 145 mg by mouth daily.    gabapentin (NEURONTIN) 100 MG capsule Take 1 capsule (100 mg total) by mouth 3 (three) times daily.   HYDROcodone-acetaminophen (NORCO/VICODIN) 5-325 MG tablet Take 1 tablet by mouth every 6 (six) hours as needed for moderate pain or severe pain.   levothyroxine (SYNTHROID) 125 MCG tablet Take 125 mcg by mouth daily before breakfast.   metoprolol tartrate (LOPRESSOR) 25 MG tablet Take 0.5 tablets (12.5 mg total) by mouth 2 (two) times daily.   nitroGLYCERIN (NITROSTAT) 0.4 MG SL tablet Place 0.4 mg under the tongue every 5 (five) minutes as needed for chest pain.   pantoprazole (PROTONIX) 40 MG tablet Take 40 mg by mouth daily.   rosuvastatin (CRESTOR) 40 MG tablet Take 40 mg  by mouth every morning.    valsartan (DIOVAN) 160 MG tablet Take 160 mg by mouth daily.     No Known Allergies  Social History   Socioeconomic History   Marital status: Married    Spouse name: Not on file   Number of children: Not on file   Years of education: Not on file   Highest education level: Not on file  Occupational History   Not on file  Tobacco Use   Smoking status: Former    Current packs/day: 0.00    Average packs/day: 1 pack/day for 45.0 years (45.0 ttl pk-yrs)    Types: Cigarettes    Start date: 07/28/1977     Quit date: 07/28/2022    Years since quitting: 1.3   Smokeless tobacco: Never  Vaping Use   Vaping status: Never Used  Substance and Sexual Activity   Alcohol use: Yes    Comment: OCCASIONAL BEER   Drug use: No   Sexual activity: Not on file  Other Topics Concern   Not on file  Social History Narrative   Not on file   Social Drivers of Health   Financial Resource Strain: Low Risk  (07/08/2023)   Received from [facility]   Overall Financial Resource Strain (CARDIA)    Difficulty of Paying Living Expenses: Not very hard  Food Insecurity: No Food Insecurity (07/08/2023)   Received from [facility]   Hunger Vital Sign    Worried About Running Out of Food in the Last Year: Never true    Ran Out of Food in the Last Year: Never true  Transportation Needs: No Transportation Needs (07/08/2023)   Received from [facility] - Transportation    Lack of Transportation (Medical): No    Lack of Transportation (Non-Medical): No  Physical Activity: Insufficiently Active (07/08/2023)   Received from [facility]   Exercise Vital Sign    Days of Exercise per Week: 7 days    Minutes of Exercise per Session: 10 min  Stress: No Stress Concern Present (07/08/2023)   Received from [facility] - Occupational Stress Questionnaire    Feeling of Stress : Only a little  Social Connections: Socially Integrated (07/08/2023)   Received from [facility]   Social Network    How would you rate your social network (family, work, friends)?: Good participation with social networks  Intimate Partner Violence: Not At Risk (07/08/2023)   Received from [facility]   HITS    Over the last 12 months how often did your partner physically hurt you?: Never    Over the last 12 months how often did your partner insult you or talk down to you?: Never    Over the last 12 months how often did your partner threaten you with physical harm?: Never    Over  the last 12 months how often did your partner scream or curse at you?: Never     Review of Systems: General: negative for chills, fever, night sweats or weight changes.  Cardiovascular: negative for chest pain, dyspnea on exertion, edema, orthopnea, palpitations, paroxysmal nocturnal dyspnea or shortness of breath Dermatological: negative for rash Respiratory: negative for cough or wheezing Urologic: negative for hematuria Abdominal: negative for nausea, vomiting, diarrhea, bright red blood per rectum, melena, or hematemesis Neurologic: negative for visual changes, syncope, or dizziness All other systems reviewed and are otherwise negative except as noted above.    Blood pressure (!) 159/89, pulse 86,  height 5\' 9"  (1.753 m), weight 223 lb (101.2 kg), SpO2 97%.  General appearance: alert and no distress Neck: no adenopathy, no carotid bruit, no JVD, supple, symmetrical, trachea midline, and thyroid not enlarged, symmetric, no tenderness/mass/nodules Lungs: clear to auscultation bilaterally Heart: regular rate and rhythm, S1, S2 normal, no murmur, click, rub or gallop Extremities: extremities normal, atraumatic, no cyanosis or edema Pulses: Decreased right pedal pulse Skin: Skin color, texture, turgor normal. No rashes or lesions Neurologic: Grossly normal  EKG not performed today      ASSESSMENT AND PLAN:   Hypertension History of essential hypertension her blood pressure measured today at 159/89.  He is on metoprolol and valsartan.  He lost his wife a month ago and has been in some pain as well which may be responsible for his elevated blood pressure today.  Dyslipidemia History of dyslipidemia on rosuvastatin, fenofibrate and Zetia with lipid profile performed 03/13/2023 revealing total cholesterol 100, LDL 35 and HDL 30.  Tobacco abuse History of discontinue tobacco abuse 16 months ago.  Peripheral vascular disease (HCC) History of PAD status post left common femoral  endarterectomy with patch angioplasty performed by Dr. Chestine Spore 03/12/2023.  I attempted mid left SFA intervention 09/16/2023 unsuccessfully.  Dr. Kary Kos subsequently was able to cross the high-grade calcified mid left SFA subintimal he and was able to stent this on 10/20/2023.  His Dopplers significantly improved as have his symptoms.  He continues to continues to have right lower extremity claudication.  He does have a 90% calcified right common femoral artery stenosis that will need staged endarterectomy and patch angioplasty with 90% focal mid and distal right SFA stenoses.  I will have Dr. Chestine Spore reach out to him in late May to address.  S/P CABG x 3 History of CAD status post CABG times 311/9/23 with a free LIMA to the LAD, vein to diagonal branch and PDA.  He has been asymptomatic since.     [name] MD FACP,FACC,FAHA, [facility] 11/26/2023 9:21 AM

## 2023-11-26 NOTE — Assessment & Plan Note (Signed)
 History of discontinue tobacco abuse 16 months ago.

## 2023-12-24 ENCOUNTER — Other Ambulatory Visit: Payer: Self-pay | Admitting: Cardiovascular Disease
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
## 2024-02-08 ENCOUNTER — Encounter: Payer: Self-pay | Admitting: Vascular Surgery

## 2024-02-08 ENCOUNTER — Other Ambulatory Visit: Payer: Self-pay

## 2024-02-08 ENCOUNTER — Ambulatory Visit: Attending: Vascular Surgery | Admitting: Vascular Surgery

## 2024-02-08 VITALS — BP 150/79 | HR 64 | Temp 98.0°F | Resp 20 | Ht 69.0 in | Wt 213.7 lb

## 2024-02-08 DIAGNOSIS — I70219 Atherosclerosis of native arteries of extremities with intermittent claudication, unspecified extremity: Secondary | ICD-10-CM

## 2024-02-15 NOTE — Pre-Procedure Instructions (Signed)
 Surgical Instructions   Your procedure is scheduled on February 21, 2024. Report to [facility] Main Entrance "A" at 5:30 A.M., then check in with the Admitting office. Any questions or running late day of surgery: call [phone]  Questions prior to your surgery date: call [phone], Monday-Friday, 8am-4pm. If you experience any cold or flu symptoms such as cough, fever, chills, shortness of breath, etc. between now and your scheduled surgery, please notify us  at the above number.     Remember:  Do not eat or drink after midnight the night before your surgery    Take these medicines the morning of surgery with A SIP OF WATER: amiodarone  (PACERONE )  aspirin   clopidogrel  (PLAVIX )  ezetimibe  (ZETIA )  fenofibrate  (TRICOR )  levothyroxine  (SYNTHROID )  metoprolol  tartrate (LOPRESSOR )  pantoprazole  (PROTONIX )  rosuvastatin  (CRESTOR )    May take these medicines IF NEEDED: HYDROcodone -acetaminophen  (NORCO/VICODIN)  nitroGLYCERIN  (NITROSTAT ) - if dose taken prior to surgery, please call either of the above phone numbers   One week prior to surgery, STOP taking any Aleve, Naproxen, Ibuprofen, Motrin, Advil, Goody's, BC's, all herbal medications, fish oil, and non-prescription vitamins.                     Do NOT Smoke (Tobacco/Vaping) for 24 hours prior to your procedure.  If you use a CPAP at night, you may bring your mask/headgear for your overnight stay.   You will be asked to remove any contacts, glasses, piercing's, hearing aid's, dentures/partials prior to surgery. Please bring cases for these items if needed.    Patients discharged the day of surgery will not be allowed to drive home, and someone needs to stay with them for 24 hours.  SURGICAL WAITING ROOM VISITATION Patients may have no more than 2 support people in the waiting area - these visitors may rotate.   Pre-op nurse will coordinate an appropriate time for 1 ADULT support person, who may not rotate, to accompany  patient in pre-op.  Children under the age of 63 must have an adult with them who is not the patient and must remain in the main waiting area with an adult.  If the patient needs to stay at the hospital during part of their recovery, the visitor guidelines for inpatient rooms apply.  Please refer to the [facility] website for the visitor guidelines for any additional information.   If you received a COVID test during your pre-op visit  it is requested that you wear a mask when out in public, stay away from anyone that may not be feeling well and notify your surgeon if you develop symptoms. If you have been in contact with anyone that has tested positive in the last 10 days please notify you surgeon.      Pre-operative CHG Bathing Instructions   You can play a key role in reducing the risk of infection after surgery. Your skin needs to be as free of germs as possible. You can reduce the number of germs on your skin by washing with CHG (chlorhexidine  gluconate) soap before surgery. CHG is an antiseptic soap that kills germs and continues to kill germs even after washing.   DO NOT use if you have an allergy to chlorhexidine /CHG or antibacterial soaps. If your skin becomes reddened or irritated, stop using the CHG and notify one of our RNs at [phone].              TAKE A SHOWER THE NIGHT BEFORE SURGERY AND THE DAY OF  SURGERY    Please keep in mind the following:  DO NOT shave, including legs and underarms, 48 hours prior to surgery.   You may shave your face before/day of surgery.  Place clean sheets on your bed the night before surgery Use a clean washcloth (not used since being washed) for each shower. DO NOT sleep with pet's night before surgery.  CHG Shower Instructions:  Wash your face and private area with normal soap. If you choose to wash your hair, wash first with your normal shampoo.  After you use shampoo/soap, rinse your hair and body thoroughly to remove shampoo/soap  residue.  Turn the water OFF and apply half the bottle of CHG soap to a CLEAN washcloth.  Apply CHG soap ONLY FROM YOUR NECK DOWN TO YOUR TOES (washing for 3-5 minutes)  DO NOT use CHG soap on face, private areas, open wounds, or sores.  Pay special attention to the area where your surgery is being performed.  If you are having back surgery, having someone wash your back for you may be helpful. Wait 2 minutes after CHG soap is applied, then you may rinse off the CHG soap.  Pat dry with a clean towel  Put on clean pajamas    Additional instructions for the day of surgery: DO NOT APPLY any lotions, deodorants, cologne, or perfumes.   Do not wear jewelry or makeup Do not wear nail polish, gel polish, artificial nails, or any other type of covering on natural nails (fingers and toes) Do not bring valuables to the hospital. [facility] is not responsible for valuables/personal belongings. Put on clean/comfortable clothes.  Please brush your teeth.  Ask your nurse before applying any prescription medications to the skin.

## 2024-02-16 ENCOUNTER — Encounter (HOSPITAL_COMMUNITY): Payer: Self-pay

## 2024-02-16 ENCOUNTER — Other Ambulatory Visit: Payer: Self-pay

## 2024-02-16 ENCOUNTER — Encounter (HOSPITAL_COMMUNITY)
Admission: RE | Admit: 2024-02-16 | Discharge: 2024-02-16 | Disposition: A | Discharge status: Home / Self Care | Source: Ambulatory Visit | Attending: Vascular Surgery | Admitting: Vascular Surgery

## 2024-02-16 VITALS — BP 157/83 | HR 66 | Temp 98.9°F | Resp 19 | Ht 69.0 in | Wt 216.0 lb

## 2024-02-16 DIAGNOSIS — I70211 Atherosclerosis of native arteries of extremities with intermittent claudication, right leg: Secondary | ICD-10-CM | POA: Diagnosis not present

## 2024-02-16 DIAGNOSIS — I129 Hypertensive chronic kidney disease with stage 1 through stage 4 chronic kidney disease, or unspecified chronic kidney disease: Secondary | ICD-10-CM | POA: Insufficient documentation

## 2024-02-16 DIAGNOSIS — Z8673 Personal history of transient ischemic attack (TIA), and cerebral infarction without residual deficits: Secondary | ICD-10-CM | POA: Diagnosis not present

## 2024-02-16 DIAGNOSIS — I70219 Atherosclerosis of native arteries of extremities with intermittent claudication, unspecified extremity: Secondary | ICD-10-CM

## 2024-02-16 DIAGNOSIS — I2510 Atherosclerotic heart disease of native coronary artery without angina pectoris: Secondary | ICD-10-CM | POA: Insufficient documentation

## 2024-02-16 DIAGNOSIS — Z01818 Encounter for other preprocedural examination: Secondary | ICD-10-CM | POA: Diagnosis present

## 2024-02-16 DIAGNOSIS — Z951 Presence of aortocoronary bypass graft: Secondary | ICD-10-CM | POA: Diagnosis not present

## 2024-02-16 DIAGNOSIS — Z87891 Personal history of nicotine dependence: Secondary | ICD-10-CM | POA: Diagnosis not present

## 2024-02-16 DIAGNOSIS — N189 Chronic kidney disease, unspecified: Secondary | ICD-10-CM | POA: Insufficient documentation

## 2024-02-16 HISTORY — DX: Chronic kidney disease, unspecified: N18.9

## 2024-02-16 LAB — COMPREHENSIVE METABOLIC PANEL WITH GFR
ALT: 24 U/L (ref 0–44)
AST: 27 U/L (ref 15–41)
Albumin: 4.3 g/dL (ref 3.5–5.0)
Alkaline Phosphatase: 27 U/L — ABNORMAL LOW (ref 38–126)
Anion gap: 9 (ref 5–15)
BUN: 17 mg/dL (ref 8–23)
CO2: 24 mmol/L (ref 22–32)
Calcium: 9.6 mg/dL (ref 8.9–10.3)
Chloride: 103 mmol/L (ref 98–111)
Creatinine, Ser: 1.62 mg/dL — ABNORMAL HIGH (ref 0.61–1.24)
GFR, Estimated: 46 mL/min — ABNORMAL LOW (ref >60)
Glucose, Bld: 97 mg/dL (ref 70–99)
Potassium: 4.5 mmol/L (ref 3.5–5.1)
Sodium: 136 mmol/L (ref 135–145)
Total Bilirubin: 0.9 mg/dL (ref 0.0–1.2)
Total Protein: 6.8 g/dL (ref 6.5–8.1)

## 2024-02-16 LAB — TYPE AND SCREEN
ABO/RH(D): A POS
Antibody Screen: NEGATIVE

## 2024-02-16 LAB — URINALYSIS, ROUTINE W REFLEX MICROSCOPIC
Bilirubin Urine: NEGATIVE
Glucose, UA: NEGATIVE mg/dL
Hgb urine dipstick: NEGATIVE
Ketones, ur: NEGATIVE mg/dL
Leukocytes,Ua: NEGATIVE
Nitrite: NEGATIVE
Protein, ur: NEGATIVE mg/dL
Specific Gravity, Urine: 1.019 (ref 1.005–1.030)
pH: 5 (ref 5.0–8.0)

## 2024-02-16 LAB — CBC
HCT: 37.9 % — ABNORMAL LOW (ref 39.0–52.0)
Hemoglobin: 12.5 g/dL — ABNORMAL LOW (ref 13.0–17.0)
MCH: 29.8 pg (ref 26.0–34.0)
MCHC: 33 g/dL (ref 30.0–36.0)
MCV: 90.5 fL (ref 80.0–100.0)
Platelets: 173 10*3/uL (ref 150–400)
RBC: 4.19 MIL/uL — ABNORMAL LOW (ref 4.22–5.81)
RDW: 12.2 % (ref 11.5–15.5)
WBC: 8.2 10*3/uL (ref 4.0–10.5)
nRBC: 0 % (ref 0.0–0.2)

## 2024-02-16 LAB — APTT: aPTT: 32 s (ref 24–36)

## 2024-02-16 LAB — SURGICAL PCR SCREEN
MRSA, PCR: NEGATIVE
Staphylococcus aureus: NEGATIVE

## 2024-02-16 LAB — PROTIME-INR
INR: 1 (ref 0.8–1.2)
Prothrombin Time: 13.7 s (ref 11.4–15.2)

## 2024-02-16 NOTE — Progress Notes (Signed)
 PCP - Kirstin A. Shepperson PA Cardiologist - Katheryne Pane, Frederico Jan, MD   PPM/ICD - denies Device Orders - n/a Rep Notified - n/a  Chest x-ray - 08-12-23 EKG - 08-12-23 Stress Test - 07-01-22 ECHO - 07-10-22 Cardiac Cath - 07-28-22  Sleep Study - denies CPAP - n/a  Dm denies  Blood Thinner Instructions:clopidogrel  (PLAVIX ) continue Aspirin  Instructions:aspirin  continue  ERAS Protcol -NPO   COVID TEST- n/a   Anesthesia review: yes  Patient denies shortness of breath, fever, cough and chest pain at PAT appointment   All instructions explained to the patient, with a verbal understanding of the material. Patient agrees to go over the instructions while at home for a better understanding. Patient also instructed to self quarantine after being tested for COVID-19. The opportunity to ask questions was provided.

## 2024-02-17 ENCOUNTER — Encounter (HOSPITAL_COMMUNITY): Payer: Self-pay
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
## 2024-02-21 ENCOUNTER — Encounter (HOSPITAL_COMMUNITY)
Admission: RE | Disposition: A | Discharge status: Home / Self Care | Payer: Self-pay | Source: Home / Self Care | Attending: Vascular Surgery

## 2024-02-21 ENCOUNTER — Inpatient Hospital Stay (HOSPITAL_COMMUNITY): Payer: Self-pay | Admitting: Vascular Surgery

## 2024-02-21 ENCOUNTER — Inpatient Hospital Stay (HOSPITAL_COMMUNITY)
Admission: RE | Admit: 2024-02-21 | Discharge: 2024-02-23 | DRG: 272 | Disposition: A | Discharge status: Home / Self Care | Attending: Vascular Surgery | Admitting: Vascular Surgery

## 2024-02-21 ENCOUNTER — Other Ambulatory Visit: Payer: Self-pay

## 2024-02-21 ENCOUNTER — Encounter (HOSPITAL_COMMUNITY): Payer: Self-pay | Admitting: Vascular Surgery

## 2024-02-21 ENCOUNTER — Inpatient Hospital Stay (HOSPITAL_COMMUNITY)

## 2024-02-21 ENCOUNTER — Inpatient Hospital Stay (HOSPITAL_COMMUNITY): Payer: Self-pay | Admitting: Anesthesiology

## 2024-02-21 DIAGNOSIS — Z79890 Hormone replacement therapy (postmenopausal): Secondary | ICD-10-CM

## 2024-02-21 DIAGNOSIS — Z951 Presence of aortocoronary bypass graft: Secondary | ICD-10-CM | POA: Diagnosis not present

## 2024-02-21 DIAGNOSIS — I2510 Atherosclerotic heart disease of native coronary artery without angina pectoris: Secondary | ICD-10-CM | POA: Diagnosis present

## 2024-02-21 DIAGNOSIS — K219 Gastro-esophageal reflux disease without esophagitis: Secondary | ICD-10-CM | POA: Diagnosis present

## 2024-02-21 DIAGNOSIS — E039 Hypothyroidism, unspecified: Secondary | ICD-10-CM | POA: Diagnosis present

## 2024-02-21 DIAGNOSIS — I10 Essential (primary) hypertension: Secondary | ICD-10-CM

## 2024-02-21 DIAGNOSIS — I70211 Atherosclerosis of native arteries of extremities with intermittent claudication, right leg: Secondary | ICD-10-CM

## 2024-02-21 DIAGNOSIS — Z8673 Personal history of transient ischemic attack (TIA), and cerebral infarction without residual deficits: Secondary | ICD-10-CM

## 2024-02-21 DIAGNOSIS — E669 Obesity, unspecified: Secondary | ICD-10-CM

## 2024-02-21 DIAGNOSIS — Z79899 Other long term (current) drug therapy: Secondary | ICD-10-CM | POA: Diagnosis not present

## 2024-02-21 DIAGNOSIS — Z8249 Family history of ischemic heart disease and other diseases of the circulatory system: Secondary | ICD-10-CM

## 2024-02-21 DIAGNOSIS — E785 Hyperlipidemia, unspecified: Secondary | ICD-10-CM | POA: Diagnosis present

## 2024-02-21 DIAGNOSIS — Z7982 Long term (current) use of aspirin: Secondary | ICD-10-CM | POA: Diagnosis not present

## 2024-02-21 DIAGNOSIS — I739 Peripheral vascular disease, unspecified: Secondary | ICD-10-CM | POA: Diagnosis present

## 2024-02-21 DIAGNOSIS — Z87891 Personal history of nicotine dependence: Secondary | ICD-10-CM

## 2024-02-21 DIAGNOSIS — I4891 Unspecified atrial fibrillation: Secondary | ICD-10-CM | POA: Diagnosis not present

## 2024-02-21 DIAGNOSIS — I7090 Unspecified atherosclerosis: Principal | ICD-10-CM | POA: Diagnosis present

## 2024-02-21 DIAGNOSIS — Z7902 Long term (current) use of antithrombotics/antiplatelets: Secondary | ICD-10-CM

## 2024-02-21 DIAGNOSIS — I7092 Chronic total occlusion of artery of the extremities: Secondary | ICD-10-CM | POA: Diagnosis not present

## 2024-02-21 HISTORY — PX: AORTOGRAM: SHX6300

## 2024-02-21 HISTORY — PX: ENDARTERECTOMY FEMORAL: SHX5804

## 2024-02-21 HISTORY — PX: INTRAOPERATIVE ARTERIOGRAM: SHX5157

## 2024-02-21 HISTORY — PX: INSERTION OF ILIAC STENT: SHX6256

## 2024-02-21 LAB — CBC
HCT: 32.3 % — ABNORMAL LOW (ref 39.0–52.0)
Hemoglobin: 10.7 g/dL — ABNORMAL LOW (ref 13.0–17.0)
MCH: 30.1 pg (ref 26.0–34.0)
MCHC: 33.1 g/dL (ref 30.0–36.0)
MCV: 91 fL (ref 80.0–100.0)
Platelets: 147 10*3/uL — ABNORMAL LOW (ref 150–400)
RBC: 3.55 MIL/uL — ABNORMAL LOW (ref 4.22–5.81)
RDW: 12.5 % (ref 11.5–15.5)
WBC: 4.8 10*3/uL (ref 4.0–10.5)
nRBC: 0 % (ref 0.0–0.2)

## 2024-02-21 LAB — CREATININE, SERUM
Creatinine, Ser: 1.98 mg/dL — ABNORMAL HIGH (ref 0.61–1.24)
GFR, Estimated: 36 mL/min — ABNORMAL LOW (ref >60)

## 2024-02-21 SURGERY — ENDARTERECTOMY, FEMORAL
Anesthesia: General | Site: Groin | Laterality: Right

## 2024-02-21 MED ORDER — PHENYLEPHRINE HCL-NACL 20-0.9 MG/250ML-% IV SOLN
INTRAVENOUS | Status: DC | PRN
  Administered 2024-02-21: 30 ug/min via INTRAVENOUS

## 2024-02-21 MED ORDER — ACETAMINOPHEN 500 MG PO TABS: 1000.0000 mg | ORAL_TABLET | Freq: Once | ORAL | Status: DC

## 2024-02-21 MED ORDER — POTASSIUM CHLORIDE CRYS ER 20 MEQ PO TBCR: 20.0000 meq | EXTENDED_RELEASE_TABLET | Freq: Every day | ORAL | Status: DC | PRN

## 2024-02-21 MED ORDER — ACETAMINOPHEN 325 MG PO TABS
325.0000 mg | ORAL_TABLET | ORAL | Status: DC | PRN
  Administered 2024-02-22 – 2024-02-23 (×2): 650 mg via ORAL
  Filled 2024-02-21 (×2): qty 2

## 2024-02-21 MED ORDER — ACETAMINOPHEN 650 MG RE SUPP: 325.0000 mg | RECTAL | Status: DC | PRN

## 2024-02-21 MED ORDER — EPHEDRINE SULFATE-NACL 50-0.9 MG/10ML-% IV SOSY
PREFILLED_SYRINGE | INTRAVENOUS | Status: DC | PRN
  Administered 2024-02-21 (×2): 5 mg via INTRAVENOUS
  Administered 2024-02-21: 10 mg via INTRAVENOUS

## 2024-02-21 MED ORDER — SUGAMMADEX SODIUM 200 MG/2ML IV SOLN
INTRAVENOUS | Status: DC | PRN
  Administered 2024-02-21: 193.2 mg via INTRAVENOUS

## 2024-02-21 MED ORDER — METOPROLOL TARTRATE 5 MG/5ML IV SOLN: 2.0000 mg | INTRAVENOUS | Status: DC | PRN

## 2024-02-21 MED ORDER — CEFAZOLIN SODIUM-DEXTROSE 2-4 GM/100ML-% IV SOLN
2.0000 g | Freq: Three times a day (TID) | INTRAVENOUS | Status: AC
  Administered 2024-02-21 (×2): 2 g via INTRAVENOUS
  Filled 2024-02-21 (×2): qty 100

## 2024-02-21 MED ORDER — ONDANSETRON HCL 4 MG/2ML IJ SOLN
INTRAMUSCULAR | Status: DC | PRN
  Administered 2024-02-21: 4 mg via INTRAVENOUS

## 2024-02-21 MED ORDER — ONDANSETRON HCL 4 MG/2ML IJ SOLN: 4.0000 mg | Freq: Four times a day (QID) | INTRAMUSCULAR | Status: DC | PRN

## 2024-02-21 MED ORDER — SODIUM CHLORIDE 0.9 % IV SOLN: 500.0000 mL | Freq: Once | INTRAVENOUS | Status: DC | PRN

## 2024-02-21 MED ORDER — OXYCODONE-ACETAMINOPHEN 5-325 MG PO TABS
1.0000 | ORAL_TABLET | ORAL | Status: DC | PRN
  Administered 2024-02-21: 2 via ORAL
  Administered 2024-02-21: 1 via ORAL
  Administered 2024-02-22 – 2024-02-23 (×6): 2 via ORAL
  Filled 2024-02-21: qty 2
  Filled 2024-02-21: qty 1
  Filled 2024-02-21 (×6): qty 2

## 2024-02-21 MED ORDER — HYDRALAZINE HCL 20 MG/ML IJ SOLN: 5.0000 mg | INTRAMUSCULAR | Status: DC | PRN

## 2024-02-21 MED ORDER — HYDROMORPHONE HCL 1 MG/ML IJ SOLN
INTRAMUSCULAR | Status: AC
  Filled 2024-02-21: qty 1

## 2024-02-21 MED ORDER — CHLORHEXIDINE GLUCONATE CLOTH 2 % EX PADS: 6.0000 | MEDICATED_PAD | Freq: Once | CUTANEOUS | Status: DC

## 2024-02-21 MED ORDER — HEMOSTATIC AGENTS (NO CHARGE) OPTIME
TOPICAL | Status: DC | PRN
  Administered 2024-02-21: 1 via TOPICAL

## 2024-02-21 MED ORDER — HEPARIN SODIUM (PORCINE) 1000 UNIT/ML IJ SOLN
INTRAMUSCULAR | Status: DC | PRN
  Administered 2024-02-21: 10000 [IU] via INTRAVENOUS
  Administered 2024-02-21: 2000 [IU] via INTRAVENOUS
  Administered 2024-02-21: 4000 [IU] via INTRAVENOUS

## 2024-02-21 MED ORDER — HEPARIN SODIUM (PORCINE) 1000 UNIT/ML IJ SOLN
INTRAMUSCULAR | Status: AC
  Filled 2024-02-21: qty 10

## 2024-02-21 MED ORDER — PROTAMINE SULFATE 10 MG/ML IV SOLN
INTRAVENOUS | Status: AC
  Filled 2024-02-21: qty 15

## 2024-02-21 MED ORDER — FENTANYL CITRATE (PF) 250 MCG/5ML IJ SOLN
INTRAMUSCULAR | Status: AC
  Filled 2024-02-21: qty 5

## 2024-02-21 MED ORDER — LIDOCAINE 2% (20 MG/ML) 5 ML SYRINGE
INTRAMUSCULAR | Status: DC | PRN
  Administered 2024-02-21: 100 mg via INTRAVENOUS

## 2024-02-21 MED ORDER — HYDROMORPHONE HCL 1 MG/ML IJ SOLN
0.2500 mg | INTRAMUSCULAR | Status: DC | PRN
  Administered 2024-02-21 (×3): 0.5 mg via INTRAVENOUS

## 2024-02-21 MED ORDER — PROTAMINE SULFATE 10 MG/ML IV SOLN
INTRAVENOUS | Status: DC | PRN
  Administered 2024-02-21: 50 mg via INTRAVENOUS

## 2024-02-21 MED ORDER — LEVOTHYROXINE SODIUM 25 MCG PO TABS
125.0000 ug | ORAL_TABLET | Freq: Every day | ORAL | Status: DC
  Administered 2024-02-22 – 2024-02-23 (×2): 125 ug via ORAL
  Filled 2024-02-21 (×2): qty 1

## 2024-02-21 MED ORDER — LABETALOL HCL 5 MG/ML IV SOLN
INTRAVENOUS | Status: DC | PRN
  Administered 2024-02-21: 10 mg via INTRAVENOUS

## 2024-02-21 MED ORDER — ROCURONIUM BROMIDE 10 MG/ML (PF) SYRINGE
PREFILLED_SYRINGE | INTRAVENOUS | Status: DC | PRN
  Administered 2024-02-21: 30 mg via INTRAVENOUS
  Administered 2024-02-21: 70 mg via INTRAVENOUS

## 2024-02-21 MED ORDER — NICOTINE 21 MG/24HR TD PT24: 21.0000 mg | MEDICATED_PATCH | Freq: Every day | TRANSDERMAL | Status: DC | PRN

## 2024-02-21 MED ORDER — NITROGLYCERIN 0.4 MG SL SUBL: 0.4000 mg | SUBLINGUAL_TABLET | SUBLINGUAL | Status: DC | PRN

## 2024-02-21 MED ORDER — MAGNESIUM SULFATE 2 GM/50ML IV SOLN: 2.0000 g | Freq: Every day | INTRAVENOUS | Status: DC | PRN

## 2024-02-21 MED ORDER — AMIODARONE HCL 200 MG PO TABS
200.0000 mg | ORAL_TABLET | Freq: Every day | ORAL | Status: DC
  Administered 2024-02-22 – 2024-02-23 (×2): 200 mg via ORAL
  Filled 2024-02-21 (×2): qty 1

## 2024-02-21 MED ORDER — POLYETHYLENE GLYCOL 3350 17 G PO PACK
17.0000 g | PACK | Freq: Every day | ORAL | Status: DC | PRN
Start: 2024-02-21 — End: 2024-02-23

## 2024-02-21 MED ORDER — EZETIMIBE 10 MG PO TABS
10.0000 mg | ORAL_TABLET | Freq: Every day | ORAL | Status: DC
  Administered 2024-02-22 – 2024-02-23 (×2): 10 mg via ORAL
  Filled 2024-02-21 (×2): qty 1

## 2024-02-21 MED ORDER — CLEVIDIPINE BUTYRATE 0.5 MG/ML IV EMUL
INTRAVENOUS | Status: AC
Start: 2024-02-21 — End: ?
  Filled 2024-02-21: qty 50

## 2024-02-21 MED ORDER — ALUM & MAG HYDROXIDE-SIMETH 200-200-20 MG/5ML PO SUSP: 15.0000 mL | ORAL | Status: DC | PRN

## 2024-02-21 MED ORDER — CEFAZOLIN SODIUM 1 G IJ SOLR
INTRAMUSCULAR | Status: AC
  Filled 2024-02-21: qty 20

## 2024-02-21 MED ORDER — LABETALOL HCL 5 MG/ML IV SOLN: 10.0000 mg | INTRAVENOUS | Status: DC | PRN

## 2024-02-21 MED ORDER — MIDAZOLAM HCL 2 MG/2ML IJ SOLN
INTRAMUSCULAR | Status: AC
Start: 2024-02-21 — End: 2024-02-21
  Filled 2024-02-21: qty 2

## 2024-02-21 MED ORDER — CEFAZOLIN SODIUM-DEXTROSE 2-4 GM/100ML-% IV SOLN
2.0000 g | INTRAVENOUS | Status: AC
  Administered 2024-02-21: 2 g via INTRAVENOUS

## 2024-02-21 MED ORDER — CEFAZOLIN SODIUM-DEXTROSE 2-4 GM/100ML-% IV SOLN
INTRAVENOUS | Status: AC
  Filled 2024-02-21: qty 100

## 2024-02-21 MED ORDER — PROPOFOL 10 MG/ML IV BOLUS
INTRAVENOUS | Status: DC | PRN
  Administered 2024-02-21: 120 mg via INTRAVENOUS
  Administered 2024-02-21: 40 mg via INTRAVENOUS

## 2024-02-21 MED ORDER — CHLORHEXIDINE GLUCONATE 0.12 % MT SOLN
OROMUCOSAL | Status: AC
  Administered 2024-02-21: 15 mL via OROMUCOSAL
  Filled 2024-02-21: qty 15

## 2024-02-21 MED ORDER — 0.9 % SODIUM CHLORIDE (POUR BTL) OPTIME
TOPICAL | Status: DC | PRN
Start: 2024-02-21 — End: 2024-02-21
  Administered 2024-02-21: 2000 mL

## 2024-02-21 MED ORDER — ORAL CARE MOUTH RINSE: 15.0000 mL | Freq: Once | OROMUCOSAL | Status: AC

## 2024-02-21 MED ORDER — DOCUSATE SODIUM 100 MG PO CAPS
100.0000 mg | ORAL_CAPSULE | Freq: Every day | ORAL | Status: DC
  Administered 2024-02-22 – 2024-02-23 (×2): 100 mg via ORAL
  Filled 2024-02-21 (×2): qty 1

## 2024-02-21 MED ORDER — HEPARIN SODIUM (PORCINE) 5000 UNIT/ML IJ SOLN
5000.0000 [IU] | Freq: Three times a day (TID) | INTRAMUSCULAR | Status: DC
  Administered 2024-02-21 – 2024-02-22 (×5): 5000 [IU] via SUBCUTANEOUS
  Filled 2024-02-21 (×6): qty 1

## 2024-02-21 MED ORDER — FENTANYL CITRATE (PF) 250 MCG/5ML IJ SOLN
INTRAMUSCULAR | Status: DC | PRN
  Administered 2024-02-21: 100 ug via INTRAVENOUS

## 2024-02-21 MED ORDER — ACETAMINOPHEN 500 MG PO TABS
500.0000 mg | ORAL_TABLET | Freq: Once | ORAL | Status: AC
  Administered 2024-02-21: 500 mg via ORAL

## 2024-02-21 MED ORDER — PANTOPRAZOLE SODIUM 40 MG PO TBEC
40.0000 mg | DELAYED_RELEASE_TABLET | Freq: Every day | ORAL | Status: DC
  Administered 2024-02-21 – 2024-02-23 (×3): 40 mg via ORAL
  Filled 2024-02-21 (×3): qty 1

## 2024-02-21 MED ORDER — DROPERIDOL 2.5 MG/ML IJ SOLN: 0.6250 mg | Freq: Once | INTRAMUSCULAR | Status: DC | PRN

## 2024-02-21 MED ORDER — GUAIFENESIN-DM 100-10 MG/5ML PO SYRP: 15.0000 mL | ORAL_SOLUTION | ORAL | Status: DC | PRN

## 2024-02-21 MED ORDER — ASPIRIN 81 MG PO TBEC
81.0000 mg | DELAYED_RELEASE_TABLET | Freq: Every day | ORAL | Status: DC
  Administered 2024-02-22 – 2024-02-23 (×2): 81 mg via ORAL
  Filled 2024-02-21 (×2): qty 1

## 2024-02-21 MED ORDER — ACETAMINOPHEN 500 MG PO TABS
ORAL_TABLET | ORAL | Status: AC
  Filled 2024-02-21: qty 2

## 2024-02-21 MED ORDER — HEPARIN 6000 UNIT IRRIGATION SOLUTION
Status: AC
Start: 2024-02-21 — End: ?
  Filled 2024-02-21: qty 500

## 2024-02-21 MED ORDER — HEPARIN SODIUM (PORCINE) 1000 UNIT/ML IJ SOLN
INTRAMUSCULAR | Status: DC | PRN
Start: 2024-02-21 — End: 2024-02-21

## 2024-02-21 MED ORDER — DEXAMETHASONE SODIUM PHOSPHATE 10 MG/ML IJ SOLN
INTRAMUSCULAR | Status: DC | PRN
  Administered 2024-02-21: 10 mg via INTRAVENOUS

## 2024-02-21 MED ORDER — CHLORHEXIDINE GLUCONATE 0.12 % MT SOLN: 15.0000 mL | Freq: Once | OROMUCOSAL | Status: AC

## 2024-02-21 MED ORDER — MORPHINE SULFATE (PF) 2 MG/ML IV SOLN: 2.0000 mg | INTRAVENOUS | Status: DC | PRN

## 2024-02-21 MED ORDER — METOPROLOL TARTRATE 12.5 MG HALF TABLET
12.5000 mg | ORAL_TABLET | Freq: Two times a day (BID) | ORAL | Status: DC
  Administered 2024-02-21 – 2024-02-23 (×5): 12.5 mg via ORAL
  Filled 2024-02-21 (×5): qty 1

## 2024-02-21 MED ORDER — MIDAZOLAM HCL 2 MG/2ML IJ SOLN
INTRAMUSCULAR | Status: DC | PRN
  Administered 2024-02-21: 2 mg via INTRAVENOUS

## 2024-02-21 MED ORDER — EPHEDRINE 5 MG/ML INJ
INTRAVENOUS | Status: AC
  Filled 2024-02-21: qty 5

## 2024-02-21 MED ORDER — CLOPIDOGREL BISULFATE 75 MG PO TABS
75.0000 mg | ORAL_TABLET | Freq: Every day | ORAL | Status: DC
  Administered 2024-02-22 – 2024-02-23 (×2): 75 mg via ORAL
  Filled 2024-02-21 (×2): qty 1

## 2024-02-21 MED ORDER — IODIXANOL 320 MG/ML IV SOLN
INTRAVENOUS | Status: DC | PRN
  Administered 2024-02-21: 70 mL via INTRAVENOUS

## 2024-02-21 MED ORDER — HEPARIN 6000 UNIT IRRIGATION SOLUTION
Status: DC | PRN
  Administered 2024-02-21: 1

## 2024-02-21 MED ORDER — PHENOL 1.4 % MT LIQD: 1.0000 | OROMUCOSAL | Status: DC | PRN

## 2024-02-21 MED ORDER — IRBESARTAN 150 MG PO TABS
150.0000 mg | ORAL_TABLET | Freq: Every day | ORAL | Status: DC
  Administered 2024-02-21 – 2024-02-23 (×3): 150 mg via ORAL
  Filled 2024-02-21 (×3): qty 1

## 2024-02-21 MED ORDER — PHENYLEPHRINE 80 MCG/ML (10ML) SYRINGE FOR IV PUSH (FOR BLOOD PRESSURE SUPPORT)
PREFILLED_SYRINGE | INTRAVENOUS | Status: DC | PRN
  Administered 2024-02-21 (×3): 80 ug via INTRAVENOUS

## 2024-02-21 MED ORDER — SODIUM CHLORIDE 0.9 % IV SOLN: INTRAVENOUS | Status: DC

## 2024-02-21 MED ORDER — ALBUMIN HUMAN 5 % IV SOLN
INTRAVENOUS | Status: DC | PRN
Start: 2024-02-21 — End: 2024-02-21

## 2024-02-21 MED ORDER — PROPOFOL 500 MG/50ML IV EMUL
INTRAVENOUS | Status: DC | PRN
  Administered 2024-02-21: 30 ug/kg/min via INTRAVENOUS

## 2024-02-21 MED ORDER — ACETAMINOPHEN 500 MG PO TABS: 500.0000 mg | ORAL_TABLET | Freq: Once | ORAL | Status: DC

## 2024-02-21 MED ORDER — LACTATED RINGERS IV SOLN: INTRAVENOUS | Status: DC

## 2024-02-21 MED ORDER — ROSUVASTATIN CALCIUM 20 MG PO TABS
40.0000 mg | ORAL_TABLET | Freq: Every morning | ORAL | Status: DC
  Administered 2024-02-23: 40 mg via ORAL
  Filled 2024-02-21: qty 2

## 2024-02-21 MED ORDER — SODIUM CHLORIDE 0.9 % IV SOLN: INTRAVENOUS | Status: AC

## 2024-02-21 SURGICAL SUPPLY — 59 items
BAG COUNTER SPONGE SURGICOUNT (BAG) ×2 IMPLANT
BAG SNAP BAND KOVER 36X36 (MISCELLANEOUS) IMPLANT
BALLOON MUSTANG 6X100X135 (BALLOONS) IMPLANT
BALLOON MUSTANG 6X150X135 (BALLOONS) IMPLANT
CANISTER SUCTION 3000ML PPV (SUCTIONS) ×2 IMPLANT
CATH BEACON 5 .035 65 KMP TIP (CATHETERS) IMPLANT
CATH OMNI FLUSH .035X70CM (CATHETERS) IMPLANT
CATH OMNI FLUSH 5F 65CM (CATHETERS) ×2 IMPLANT
CATH QUICKCROSS SUPP .035X90CM (MICROCATHETER) IMPLANT
CHLORAPREP W/TINT 26 (MISCELLANEOUS) ×2 IMPLANT
CLIP TI MEDIUM 24 (CLIP) ×2 IMPLANT
CLIP TI WIDE RED SMALL 24 (CLIP) ×2 IMPLANT
CLOSURE MYNX CONTROL 6F/7F (Vascular Products) IMPLANT
COVER DOME SNAP 22 D (MISCELLANEOUS) IMPLANT
DERMABOND ADVANCED .7 DNX12 (GAUZE/BANDAGES/DRESSINGS) ×2 IMPLANT
DEVICE CLOSURE MYNXGRIP 6/7F (Vascular Products) IMPLANT
DRAPE C-ARM 42X72 X-RAY (DRAPES) IMPLANT
ELECTRODE REM PT RTRN 9FT ADLT (ELECTROSURGICAL) ×2 IMPLANT
EVACUATOR SILICONE 100CC (DRAIN) IMPLANT
GLIDEWIRE ADV .035X180CM (WIRE) IMPLANT
GLIDEWIRE ADV .035X260CM (WIRE) IMPLANT
GLOVE BIO SURGEON STRL SZ7.5 (GLOVE) ×2 IMPLANT
GLOVE BIOGEL PI IND STRL 8 (GLOVE) ×2 IMPLANT
GOWN STRL REUS W/ TWL LRG LVL3 (GOWN DISPOSABLE) ×6 IMPLANT
GOWN STRL REUS W/ TWL XL LVL3 (GOWN DISPOSABLE) ×4 IMPLANT
HEMOSTAT SNOW SURGICEL 2X4 (HEMOSTASIS) IMPLANT
KIT BASIN OR (CUSTOM PROCEDURE TRAY) ×2 IMPLANT
KIT ENCORE 26 ADVANTAGE (KITS) IMPLANT
KIT TURNOVER KIT B (KITS) ×2 IMPLANT
LOOP VESSEL MINI RED (MISCELLANEOUS) IMPLANT
NS IRRIG 1000ML POUR BTL (IV SOLUTION) ×4 IMPLANT
PACK ENDO MINOR (CUSTOM PROCEDURE TRAY) ×2 IMPLANT
PACK PERIPHERAL VASCULAR (CUSTOM PROCEDURE TRAY) ×2 IMPLANT
PAD ARMBOARD POSITIONER FOAM (MISCELLANEOUS) ×4 IMPLANT
PATCH VASC XENOSURE 1X6 (Vascular Products) IMPLANT
SET MICROPUNCTURE 5F STIFF (MISCELLANEOUS) ×2 IMPLANT
SHEATH PINNACLE 5F 10CM (SHEATH) ×2 IMPLANT
SHEATH PINNACLE 6F 10CM (SHEATH) IMPLANT
SHEATH PINNACLE 8F 10CM (SHEATH) ×2 IMPLANT
SHEATH PINNACLE ST 6F 45CM (SHEATH) IMPLANT
STENT ELUVIA 7X100X130 (Permanent Stent) IMPLANT
STENT ELUVIA 7X150X130 (Permanent Stent) IMPLANT
STOPCOCK MORSE 400PSI 3WAY (MISCELLANEOUS) IMPLANT
SUT MNCRL AB 4-0 PS2 18 (SUTURE) ×2 IMPLANT
SUT PROLENE 5 0 C 1 24 (SUTURE) ×2 IMPLANT
SUT PROLENE 6 0 BV (SUTURE) IMPLANT
SUT VIC AB 2-0 CT1 TAPERPNT 27 (SUTURE) ×2 IMPLANT
SUT VIC AB 3-0 SH 27X BRD (SUTURE) ×2 IMPLANT
SYR MEDRAD MARK V 150ML (SYRINGE) IMPLANT
TOWEL GREEN STERILE (TOWEL DISPOSABLE) ×2 IMPLANT
TRAY FOLEY MTR SLVR 16FR STAT (SET/KITS/TRAYS/PACK) IMPLANT
TUBING HIGH PRESSURE 120CM (CONNECTOR) IMPLANT
UNDERPAD 30X36 HEAVY ABSORB (UNDERPADS AND DIAPERS) ×2 IMPLANT
WATER STERILE IRR 1000ML POUR (IV SOLUTION) ×2 IMPLANT
WIRE AMPLATZ SS-J .035X180CM (WIRE) IMPLANT
WIRE AMPLATZ SS-J .035X260CM (WIRE) IMPLANT
WIRE BENTSON .035X145CM (WIRE) ×2 IMPLANT
WIRE G V18X300CM (WIRE) IMPLANT
WIRE TORQFLEX AUST .018X40CM (WIRE) IMPLANT

## 2024-02-21 NOTE — Anesthesia Postprocedure Evaluation (Signed)
 Anesthesia Post Note  Patient: LOXLEY SCHMALE  Procedure(s) Performed: RIGHT FEMORAL ENDARTERECTOMY WITH PATCH ANGIOPLASTY (Right: Groin) INSERTION, STENT, ARTERY, ABOVE KNEE POPLITEAL (Right: Groin) INTRA OPERATIVE ARTERIOGRAM AORTOGRAM     Patient location during evaluation: PACU Anesthesia Type: General Level of consciousness: sedated and patient cooperative Pain management: pain level controlled Vital Signs Assessment: post-procedure vital signs reviewed and stable Respiratory status: spontaneous breathing Cardiovascular status: stable Anesthetic complications: no   No notable events documented.  Last Vitals:  Vitals:   02/21/24 1430 02/21/24 1500  BP: 130/71 134/76  Pulse: (!) 56 (!) 53  Resp: 16 11  Temp:    SpO2: 99% 100%    Last Pain:  Vitals:   02/21/24 1525  TempSrc:   PainSc: 5                  Gorman Laughter

## 2024-02-21 NOTE — Op Note (Signed)
 Date: February 21, 2024  Preoperative diagnosis: Disabling right lower extremity claudication with right common femoral, SFA and popliteal disease  Postoperative diagnosis: Same  Procedure: 1.  Right common femoral endarterectomy with bovine pericardial patch angioplasty 2.  Ultrasound-guided access left common femoral artery 3.  Right iliac arteriogram with right lower extremity arteriogram including catheter selection of the right below-knee popliteal artery 4.  Angioplasty and stent of distal right SFA above-knee popliteal artery (7 mm x 150 mm Eluvia postdilated with a 6 mm Mustang) 5.  Angioplasty and stent of the proximal right SFA 7 (7 mm x 100 mm Eluvia postdilated with a 6 mm Mustang) 6. Mynx closure left common femoral artery  Surgeon: Dr. [name], MD  Assistant: Naida Austria, PA  Indications: 68 year old male with disabling right lower extremity claudication with high-grade right common femoral stenosis as well as infrainguinal disease in the SFA popliteal artery.  He presents for right lower extremity revascularization after risks benefits discussed.  Assistant was needed given the complexity the case and also for wire and sheath exchange.  Findings: Transverse incision in the right groin.  After we dissected out the SFA and profunda and the distal external iliac artery there was extensive endarterectomy of the right common femoral with placement of a bovine pericardial patch.  I then tried to access the femoral patch antegrade but it was difficult to get into the SFA.  I then elected to access the left common femoral artery and came over the aortic bifurcation.  Right lower extremity angiogram was obtained that showed widely patent common femoral and profunda after endarterectomy.  The SFA had a proximal 50 to 60% stenosis that was stented as well as a mid to distal SFA had a chronic total occlusion with a second high-grade stenosis >80% in the above-knee popliteal artery  that were also stented with 7 mm Eluvia stents.  Mynx closure of the left common femoral artery.  Anesthesia: General  Details: Patient was taken to the operating room after informed consent was obtained.  Placed on the operating table in the supine position.  General endotracheal anesthesia was induced.  The bilateral groins were then prepped and draped in standard sterile fashion.  Antibiotics were given and timeout performed.  Initially made a transverse incision in the right groin.  Dissected down Bovie cautery.  Used cerebellum retractors.  The SFA and profunda were dissected out as well as the distal external iliac and all these were controlled with Vesseloops.  Patient was given 100 units/kg IV heparin .  Checked ACT to ensure it was greater than 250.  We used Vesseloops to control the SFA and profunda and a Henley clamp on the distal external iliac.  The right common femoral artery was opened with 11 blade scalpel Potts scissors.  We then performed extensive endarterectomy with [facility] elevator over the distal external iliac and common femoral artery with the help of my assistant.  The distal endpoint was then tacked down with multiple 6-0 Prolene's just before the takeoff of the profunda.  I did not carry this onto the profunda as this was patent on angiogram without flow-limiting stenosis.  We then brought a bovine patch on the field and this was sewn in place with 5-0 Prolene parachute technique and de-aired prior to completion.  I did have to put several repair sutures in it.  I then planned to shoot right lower extremity angiogram and tried to access the patch for antegrade access.  Due to his obesity it  made the angle somewhat challenging.  I ultimately elected to access the left common femoral artery that was already prepped in the field.  This was accessed under ultrasound guidance with micro access needle placed a microwire and micro sheath.  I then used a Glidewire advantage and exchanged for a  short 6 [place] sheath.  I then took an Omni Flush catheter into the abdominal aorta and we got a iliac arteriogram of the right lower extremity with a mobile C arm.  I then cannulated the external iliac artery and passed the wire into the common femoral on the right.  We then got right lower extremity runoff that showed about a 50 to 60% stenosis in the proximal SFA with a separate short chronic total occlusion in the mid to distal SFA and a third high-grade over 80% stenosis in the above-knee popliteal artery.  I then upsized to a 6 French sheath in the left groin over the aortic bifurcation.  I used quick cross catheter and I was able to cross all the stenosis in the right SFA above-knee popliteal artery antegrade.  Initially I stented the distal SFA chronic total occlusion and above-knee popliteal stenosis with a 7 mm x 150 mm Eluvia postdilated with a 6 mm Mustang.  I then came more proximally and then treated the more proximal SFA disease with a 7 mm x 100 mm Eluvia postdilated with a 6 mm Mustang.  Widely patent stents with preserved three-vessel runoff.  Wires and catheters were removed and a put a short 6 French sheath in the left groin.  The foot was checked and we had a good signal in the right foot.  Protamine  was given for reversal.  I put one additional repair suture in the patch.  The right groin was then irrigated copiously.  This was closed in multiple layers of 2-0 Vicryl, 3-0 Vicryl, 4-0 Monocryl and Dermabond.  The left 6 French sheath was removed and the Mynx closure was deployed.  Taken to recovery in stable condition.    Complication: None  Condition: Stable  [name], MD [facility] Office: [phone]   [name]

## 2024-02-21 NOTE — Anesthesia Procedure Notes (Signed)
 Procedure Name: Intubation Date/Time: 02/21/2024 7:50 AM  Performed by: Robert Chimes, CRNAPre-anesthesia Checklist: Patient identified, Emergency Drugs available, Suction available and Patient being monitored Patient Re-evaluated:Patient Re-evaluated prior to induction Oxygen Delivery Method: Circle system utilized Preoxygenation: Pre-oxygenation with 100% oxygen Induction Type: IV induction Ventilation: Mask ventilation without difficulty Laryngoscope Size: Mac and 3 Grade View: Grade II Tube type: Oral Tube size: 7.0 mm Number of attempts: 1 Airway Equipment and Method: Stylet and Oral airway Placement Confirmation: ETT inserted through vocal cords under direct vision, positive ETCO2 and breath sounds checked- equal and bilateral Secured at: 23 cm Tube secured with: Tape Dental Injury: Teeth and Oropharynx as per pre-operative assessment
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
## 2024-02-21 NOTE — Transfer of Care (Signed)
 Immediate Anesthesia Transfer of Care Note  Patient: Carl [name]  Procedure(s) Performed: RIGHT FEMORAL ENDARTERECTOMY WITH PATCH ANGIOPLASTY (Right: Groin) INSERTION, STENT, ARTERY, ABOVE KNEE POPLITEAL (Right: Groin) INTRA OPERATIVE ARTERIOGRAM AORTOGRAM  Patient Location: PACU  Anesthesia Type:General  Level of Consciousness: awake, alert , and oriented  Airway & Oxygen Therapy: Patient Spontanous Breathing and Patient connected to nasal cannula oxygen  Post-op Assessment: Report given to RN and Post -op Vital signs reviewed and stable  Post vital signs: Reviewed and stable  Last Vitals:  Vitals Value Taken Time  BP 108/61 02/21/24 1145  Temp 36.8 C 02/21/24 1112  Pulse 56 02/21/24 1146  Resp 19 02/21/24 1146  SpO2 91 % 02/21/24 1146  Vitals shown include unfiled device data.  Last Pain:  Vitals:   02/21/24 1130  TempSrc:   PainSc: 5          Complications: No notable events documented.

## 2024-02-21 NOTE — Progress Notes (Signed)
 Patient brought to 4E from PACU. Telemetry box applied, CCMD notified. Patient oriented to room and staff. Call bell in reach.   02/21/24 1237  Vitals  Temp 97.7 F (36.5 C)  Temp Source Oral  BP 117/66  MAP (mmHg) 82  BP Location Left Arm  BP Method Automatic  Patient Position (if appropriate) Lying  Pulse Rate (!) 56  Pulse Rate Source Monitor  ECG Heart Rate 65  Resp 20  Level of Consciousness  Level of Consciousness Alert  MEWS COLOR  MEWS Score Color Green  Oxygen Therapy  SpO2 94 %  O2 Device Nasal Cannula  O2 Flow Rate (L/min) 2 L/min  Art Line  Arterial Line BP 115/60  Arterial Line MAP (mmHg) 76 mmHg  MEWS Score  MEWS Temp 0  MEWS Systolic 0  MEWS Pulse 0  MEWS RR 0  MEWS LOC 0  MEWS Score 0

## 2024-02-21 NOTE — Anesthesia Procedure Notes (Signed)
 Arterial Line Insertion Start/End6/10/2023 7:00 AM, 02/21/2024 7:10 AM Performed by: Robert Chimes, CRNA  Patient location: Pre-op. Preanesthetic checklist: patient identified, IV checked, site marked, risks and benefits discussed, surgical consent, monitors and equipment checked, pre-op evaluation, timeout performed and anesthesia consent Lidocaine  1% used for infiltration Left, radial was placed Catheter size: 20 G Hand hygiene performed  and maximum sterile barriers used   Attempts: 2 Procedure performed using ultrasound guided technique. Ultrasound Notes:anatomy identified and no ultrasound evidence of intravascular and/or intraneural injection Following insertion, dressing applied and Biopatch. Post procedure assessment: normal and unchanged  Patient tolerated the procedure well with no immediate complications.

## 2024-02-22 ENCOUNTER — Encounter (HOSPITAL_COMMUNITY): Payer: Self-pay | Admitting: Vascular Surgery

## 2024-02-22 LAB — CBC
HCT: 31 % — ABNORMAL LOW (ref 39.0–52.0)
Hemoglobin: 10.4 g/dL — ABNORMAL LOW (ref 13.0–17.0)
MCH: 30.5 pg (ref 26.0–34.0)
MCHC: 33.5 g/dL (ref 30.0–36.0)
MCV: 90.9 fL (ref 80.0–100.0)
Platelets: 176 10*3/uL (ref 150–400)
RBC: 3.41 MIL/uL — ABNORMAL LOW (ref 4.22–5.81)
RDW: 12.4 % (ref 11.5–15.5)
WBC: 8.6 10*3/uL (ref 4.0–10.5)
nRBC: 0 % (ref 0.0–0.2)

## 2024-02-22 LAB — BASIC METABOLIC PANEL WITH GFR
Anion gap: 11 (ref 5–15)
BUN: 25 mg/dL — ABNORMAL HIGH (ref 8–23)
CO2: 20 mmol/L — ABNORMAL LOW (ref 22–32)
Calcium: 9 mg/dL (ref 8.9–10.3)
Chloride: 104 mmol/L (ref 98–111)
Creatinine, Ser: 1.8 mg/dL — ABNORMAL HIGH (ref 0.61–1.24)
GFR, Estimated: 41 mL/min — ABNORMAL LOW (ref >60)
Glucose, Bld: 152 mg/dL — ABNORMAL HIGH (ref 70–99)
Potassium: 4.3 mmol/L (ref 3.5–5.1)
Sodium: 135 mmol/L (ref 135–145)

## 2024-02-22 LAB — LIPID PANEL
Cholesterol: 97 mg/dL (ref 0–200)
HDL: 32 mg/dL — ABNORMAL LOW (ref >40)
LDL Cholesterol: 39 mg/dL (ref 0–99)
Total CHOL/HDL Ratio: 3 ratio
Triglycerides: 131 mg/dL (ref <150)
VLDL: 26 mg/dL (ref 0–40)

## 2024-02-22 NOTE — Plan of Care (Signed)

## 2024-02-22 NOTE — Progress Notes (Signed)
   02/22/24 1603  TOC Brief Assessment  Insurance and Status Reviewed  Patient has primary care physician Yes  Home environment has been reviewed home alone  Prior level of function: self/independent  Prior/Current Home Services No current home services  Social Drivers of Health Review SDOH reviewed no interventions necessary  Readmission risk has been reviewed Yes  Transition of care needs no transition of care needs at this time    No HH or DME needs noted for discharge. Pt with [facility] office referral to [facility] for [facility] needs- [facility] liaison Glenora Laos updated.

## 2024-02-22 NOTE — Evaluation (Signed)
 Occupational Therapy Evaluation Patient Details Name: Carl [name] MRN: [id number] DOB: 1956-03-18 Today's Date: 02/22/2024   History of Present Illness   Carl [name] is a 68 y.o. male, with history of coronary artery disease status post CABG, hypertension, PAD, tobacco abuse that presents to discuss right femoral endarterectomy; now s/p right common femoral endarterectomy, right distal SFA and proximal SFA stenting     Clinical Impressions Pt presents with decline in function and safety with ADLs and ADL mobility with impaired activity tolerance. PTA pt lives alone and was Ind with ADLs, IADLs, home mgt, drives, grocery shops, cooks. Pt currently requires Sup for safety with LB ADLs and mobility using RW. Pt able to transfer to toilet and shower using grab bars safely sit Sup, no SOB, LOB or difficulty. VSS throughout session. Pt hoping to d/c home tomorrow with family support as needed. OT will follow acutely to maximize level of function and safety     If plan is discharge home, recommend the following:   A little help with bathing/dressing/bathroom;A little help with walking and/or transfers;Assistance with cooking/housework;Assist for transportation;Help with stairs or ramp for entrance     Functional Status Assessment   Patient has had a recent decline in their functional status and demonstrates the ability to make significant improvements in function in a reasonable and predictable amount of time.     Equipment Recommendations   None recommended by OT     Recommendations for Other Services         Precautions/Restrictions   Precautions Precautions: None Restrictions Weight Bearing Restrictions Per Provider Order: No     Mobility Bed Mobility Overal bed mobility: Modified Independent                  Transfers Overall transfer level: Needs assistance Equipment used: Rolling walker (2 wheels)   Sit to Stand: Supervision           General  transfer comment: pt transfers on/off toilet and in/out shower using grab bars for safety, no LOB      Balance Overall balance assessment: No apparent balance deficits (not formally assessed)                                         ADL either performed or assessed with clinical judgement   ADL Overall ADL's : Needs assistance/impaired Eating/Feeding: Independent   Grooming: Wash/dry hands;Wash/dry face;Standing;Supervision/safety   Upper Body Bathing: Set up   Lower Body Bathing: Supervison/ safety   Upper Body Dressing : Set up   Lower Body Dressing: Supervision/safety   Toilet Transfer: Supervision/safety;Ambulation;Rolling walker (2 wheels);Grab bars   Toileting- Clothing Manipulation and Hygiene: Supervision/safety;Sit to/from stand   Tub/ Shower Transfer: Supervision/safety;Ambulation;Rolling walker (2 wheels);Grab bars;BSC/3in1   Functional mobility during ADLs: Supervision/safety       Vision Ability to See in Adequate Light: 0 Adequate Patient Visual Report: No change from baseline       Perception         Praxis         Pertinent Vitals/Pain Pain Assessment Pain Assessment: Faces Faces Pain Scale: Hurts little more Pain Location: Rt groin Pain Descriptors / Indicators: Aching, Discomfort Pain Intervention(s): Monitored during session, Premedicated before session, Repositioned     Extremity/Trunk Assessment Upper Extremity Assessment Upper Extremity Assessment: Overall WFL for tasks assessed;Right hand dominant   Lower Extremity Assessment Lower Extremity Assessment: Defer  to PT evaluation   Cervical / Trunk Assessment Cervical / Trunk Assessment: Normal   Communication Communication Communication: No apparent difficulties   Cognition Arousal: Alert Behavior During Therapy: WFL for tasks assessed/performed                                 Following commands: Intact       Cueing  General Comments    Cueing Techniques: Verbal cues      Exercises     Shoulder Instructions      Home Living Family/patient expects to be discharged to:: Private residence Living Arrangements: Alone Available Help at Discharge: Family;Available 24 hours/day Type of Home: House Home Access: Stairs to enter [employer] of Steps: 2 Entrance Stairs-Rails: Left Home Layout: One level     Bathroom Shower/Tub: Producer, television/film/video: Handicapped height Bathroom Accessibility: Yes   Home Equipment: Cane - single point;Shower seat;Hand held shower head;Grab bars - tub/shower;Grab bars - toilet   Additional Comments: pt enjoys fishing and hunting      Prior Functioning/Environment Prior Level of Function : Driving;Independent/Modified Independent             Mobility Comments: indep ADLs Comments: Ind with ADLs, IADLs, home mgt, cooks, grocery shops (fatigued easily in grocery aisles)    OT Problem List: Pain;Decreased activity tolerance   OT Treatment/Interventions: Self-care/ADL training;Patient/family education;Therapeutic activities;DME and/or AE instruction      OT Goals(Current goals can be found in the care plan section)   Acute Rehab OT Goals Patient Stated Goal: go home OT Goal Formulation: With patient Time For Goal Achievement: 03/07/24 Potential to Achieve Goals: Good ADL Goals Pt Will Perform Grooming: with modified independence;standing Pt Will Perform Lower Body Bathing: with set-up;with modified independence;sit to/from stand Pt Will Perform Lower Body Dressing: with set-up;with modified independence;sit to/from stand Pt Will Transfer to Toilet: with modified independence;ambulating Pt Will Perform Tub/Shower Transfer: with modified independence;ambulating;shower seat;grab bars Additional ADL Goal #1: Pt will gather toiletry/selfcare items in prep for ADLs with Sup - Mod I   OT Frequency:  Min 2X/week    Co-evaluation              AM-PAC  OT "6 Clicks" Daily Activity     Outcome Measure Help from another person eating meals?: None Help from another person taking care of personal grooming?: A Little Help from another person toileting, which includes using toliet, bedpan, or urinal?: A Little Help from another person bathing (including washing, rinsing, drying)?: A Little Help from another person to put on and taking off regular upper body clothing?: A Little Help from another person to put on and taking off regular lower body clothing?: A Little 6 Click Score: 19   End of Session Equipment Utilized During Treatment: Rolling walker (2 wheels) Nurse Communication: Mobility status  Activity Tolerance: Patient tolerated treatment well Patient left: in bed;Other (comment);with call bell/phone within reach (sitting EOB)  OT Visit Diagnosis: Other abnormalities of gait and mobility (R26.89);Pain Pain - Right/Left: Right Pain - part of body:  (surgical site-groin)                Time: 1610-9604 OT Time Calculation (min): 17 min Charges:  OT General Charges $OT Visit: 1 Visit OT Evaluation $OT Eval Low Complexity: 1 Low   Alfred Ann 02/22/2024, 3:17 PM

## 2024-02-22 NOTE — Progress Notes (Signed)
 PHARMACIST LIPID MONITORING   Carl [name] is a 68 y.o. male admitted on 02/21/2024 with ATHROSCLEROSIS RLE WITH CLAUDICATION .  Pharmacy has been consulted to optimize lipid-lowering therapy with the indication of secondary prevention for clinical ASCVD.  Recent Labs:  Lipid Panel (last 6 months):   Lab Results  Component Value Date   CHOL 97 02/22/2024   TRIG 131 02/22/2024   HDL 32 (L) 02/22/2024   CHOLHDL 3.0 02/22/2024   VLDL 26 02/22/2024   LDLCALC 39 02/22/2024    Hepatic function panel (last 6 months):   Lab Results  Component Value Date   AST 27 02/16/2024   ALT 24 02/16/2024   ALKPHOS 27 (L) 02/16/2024   BILITOT 0.9 02/16/2024    SCr (since admission):   Serum creatinine: 1.8 mg/dL (H) 16/10/96 0454 Estimated creatinine clearance: 45.7 mL/min (A)  Current therapy and lipid therapy tolerance Current lipid-lowering therapy: crestor  40mg  and fenofibrate  145mg  Previous lipid-lowering therapies (if applicable): n/a Documented or reported allergies or intolerances to lipid-lowering therapies (if applicable): none  Assessment:   Patient prefers no changes in lipid-lowering therapy at this time due to not needed  Plan:    1.Statin intensity (high intensity recommended for all patients regardless of the LDL):  No statin changes. The patient is already on a high intensity statin.  2.Add ezetimibe  (if any one of the following):   Not indicated at this time.  3.Refer to lipid clinic:   No  4.Follow-up with:  Primary care provider - Yvonnie Heritage, FNP  5.Follow-up labs after discharge:  No changes in lipid therapy, repeat a lipid panel in one year.       Hollis Lurie, PharmD 02/22/2024, 9:09 AM

## 2024-02-22 NOTE — Progress Notes (Addendum)
  Progress Note    02/22/2024 7:19 AM 1 Day Post-Op  Subjective:  says he feels okay. He is sore in both groins this morning    Vitals:   02/22/24 0200 02/22/24 0300  BP: (!) 144/78 115/66  Pulse: (!) 59 (!) 58  Resp: 19 16  Temp:  98.4 F (36.9 C)  SpO2: 93% 94%    Physical Exam: General:  resting comfortably Cardiac:  regular Lungs:  nonlabored Incisions:  right groin incision c/d/l without hematoma. L groin cath site intact without hematoma Extremities:  palpable DP pulses bilaterally   CBC    Component Value Date/Time   WBC 8.6 02/22/2024 0350   RBC 3.41 (L) 02/22/2024 0350   HGB 10.4 (L) 02/22/2024 0350   HGB 13.0 10/06/2023 1148   HCT 31.0 (L) 02/22/2024 0350   HCT 40.3 10/06/2023 1148   PLT 176 02/22/2024 0350   PLT 217 10/06/2023 1148   MCV 90.9 02/22/2024 0350   MCV 94 10/06/2023 1148   MCH 30.5 02/22/2024 0350   MCHC 33.5 02/22/2024 0350   RDW 12.4 02/22/2024 0350   RDW 12.3 10/06/2023 1148   LYMPHSABS 1.1 10/06/2023 1148   MONOABS 0.5 01/22/2020 1344   EOSABS 0.2 10/06/2023 1148   BASOSABS 0.1 10/06/2023 1148    BMET    Component Value Date/Time   NA 135 02/22/2024 0350   NA 137 10/06/2023 1148   K 4.3 02/22/2024 0350   CL 104 02/22/2024 0350   CO2 20 (L) 02/22/2024 0350   GLUCOSE 152 (H) 02/22/2024 0350   BUN 25 (H) 02/22/2024 0350   BUN 15 10/06/2023 1148   CREATININE 1.80 (H) 02/22/2024 0350   CALCIUM  9.0 02/22/2024 0350   GFRNONAA 41 (L) 02/22/2024 0350   GFRAA >60 01/22/2020 1232    INR    Component Value Date/Time   INR 1.0 02/16/2024 1030     Intake/Output Summary (Last 24 hours) at 02/22/2024 0719 Last data filed at 02/22/2024 0040 Gross per 24 hour  Intake 3017.5 ml  Output 1625 ml  Net 1392.5 ml      Assessment/Plan:  68 y.o. male is 1 day post op, s/p: right common femoral endarterectomy, right distal SFA and proximal SFA stenting  -Overall he is doing well this morning. He does report some soreness in both  groins but otherwise no pain anywhere else -BLE warm and well perfused with palpable DP pulses -Right groin incision dry and intact without hematoma. L groin cath site intact without hematoma -Hemoglobin stable at 10.4 -Scr around baseline at 1.8 -Continue ASA, plavix , and statin. We will see how he mobilizes with PT/OT today. Potential discharge in the next few days   Deneise Finlay, [place] [facility] [phone] 02/22/2024 7:19 AM  I have seen and evaluated the patient. I agree with the PA note as documented above.  Postop day 1 status post right common femoral endarterectomy with stenting of the right SFA and above-knee popliteal artery for disabling claudication.  Groins look good.  Palpable DP pulses bilaterally.  Aspirin  statin Plavix  for risk reduction.  Feels he needs 1 more day and will work on mobility today.  [name], MD [facility] [place] Office: [phone]

## 2024-02-22 NOTE — Evaluation (Signed)
 Physical Therapy Evaluation Patient Details Name: Carl [name] MRN: [id number] DOB: 18-Jul-1956 Today's Date: 02/22/2024  History of Present Illness  Carl [name] is a 68 y.o. male, with history of coronary artery disease status post CABG, hypertension, PAD, tobacco abuse that presents to discuss right femoral endarterectomy; now s/p right common femoral endarterectomy, right distal SFA and proximal SFA stenting   Clinical Impression  Carl [name] is 68 y.o. male admitted with above HPI and diagnosis. Patient is currently limited by functional impairments below (see PT problem list). Patient lives alone and is independent with no AD for mobility at baseline. Currently pt using RW to reduce pressure on LE's due to mild Rt>Lt groin pain. Overall completing bed mob at mod ind level and transfers and gait with RW at supervision level. Patient will benefit from continued skilled PT interventions to address impairments and progress independence with mobility. Acute PT will follow and progress as able.         If plan is discharge home, recommend the following: Assist for transportation   Can travel by private vehicle        Equipment Recommendations None recommended by PT  Recommendations for Other Services       Functional Status Assessment Patient has had a recent decline in their functional status and demonstrates the ability to make significant improvements in function in a reasonable and predictable amount of time.     Precautions / Restrictions Precautions Precautions: Fall Recall of Precautions/Restrictions: Intact Restrictions Weight Bearing Restrictions Per Provider Order: No      Mobility  Bed Mobility Overal bed mobility: Modified Independent                  Transfers Overall transfer level: Needs assistance Equipment used: Rolling walker (2 wheels) Transfers: Sit to/from Stand Sit to Stand: Supervision           General transfer comment: use of UE's to  rise and lower    Ambulation/Gait Ambulation/Gait assistance: Contact guard assist, Supervision Gait Distance (Feet): 300 Feet Assistive device: Rolling walker (2 wheels) Gait Pattern/deviations: Step-through pattern, Decreased stride length, Trunk flexed Gait velocity: decr     General Gait Details: cues for position to RW, overall slightly flexed posture. pt steady throughout, no LOB noted. able to navigate hallway and negotiate tight bathroom space.  Stairs            Wheelchair Mobility     Tilt Bed    Modified Rankin (Stroke Patients Only)       Balance Overall balance assessment: Needs assistance Sitting-balance support: Feet supported Sitting balance-Leahy Scale: Good     Standing balance support: Bilateral upper extremity supported, Reliant on assistive device for balance, During functional activity Standing balance-Leahy Scale: Fair                               Pertinent Vitals/Pain Pain Assessment Pain Assessment: Faces Faces Pain Scale: Hurts little more Pain Location: Rt groin Pain Descriptors / Indicators: Aching, Discomfort Pain Intervention(s): Limited activity within patient's tolerance, Monitored during session    Home Living Family/patient expects to be discharged to:: Private residence Living Arrangements: Alone Available Help at Discharge: Family;Available 24 hours/day Type of Home: House Home Access: Stairs to enter Entrance Stairs-Rails: Left Entrance Stairs-Number of Steps: 2   Home Layout: One level Home Equipment: Cane - single point;Shower seat;Hand held shower head;Grab bars - tub/shower;Grab bars - toilet Additional  Comments: pt enjoys fishing and hunting    Prior Function Prior Level of Function : Driving;Independent/Modified Independent             Mobility Comments: indep ADLs Comments: Ind with ADLs, IADLs, home mgt, cooks, grocery shops (fatigued easily in grocery aisles)     Extremity/Trunk  Assessment   Upper Extremity Assessment Upper Extremity Assessment: Overall WFL for tasks assessed;Right hand dominant    Lower Extremity Assessment Lower Extremity Assessment: Defer to PT evaluation    Cervical / Trunk Assessment Cervical / Trunk Assessment: Normal  Communication   Communication Communication: No apparent difficulties    Cognition Arousal: Alert Behavior During Therapy: WFL for tasks assessed/performed   PT - Cognitive impairments: No apparent impairments                         Following commands: Intact       Cueing Cueing Techniques: Verbal cues     General Comments      Exercises     Assessment/Plan    PT Assessment Patient needs continued PT services  PT Problem List Decreased strength;Decreased activity tolerance;Decreased balance;Decreased mobility;Decreased knowledge of use of DME;Decreased knowledge of precautions;Cardiopulmonary status limiting activity       PT Treatment Interventions DME instruction;Gait training;Stair training;Functional mobility training;Therapeutic activities;Therapeutic exercise;Balance training;Patient/family education    PT Goals (Current goals can be found in the Care Plan section)  Acute Rehab PT Goals Patient Stated Goal: return home tomorrow and continue to recover PT Goal Formulation: With patient Time For Goal Achievement: 03/07/24 Potential to Achieve Goals: Good    Frequency Min 2X/week     Co-evaluation               AM-PAC PT "6 Clicks" Mobility  Outcome Measure Help needed turning from your back to your side while in a flat bed without using bedrails?: None Help needed moving from lying on your back to sitting on the side of a flat bed without using bedrails?: None Help needed moving to and from a bed to a chair (including a wheelchair)?: A Little Help needed standing up from a chair using your arms (e.g., wheelchair or bedside chair)?: A Little Help needed to walk in hospital  room?: A Little Help needed climbing 3-5 steps with a railing? : A Little 6 Click Score: 20    End of Session Equipment Utilized During Treatment: Gait belt Activity Tolerance: Patient tolerated treatment well Patient left: in bed;with call bell/phone within reach (seated EOB) Nurse Communication: Mobility status PT Visit Diagnosis: Unsteadiness on feet (R26.81);Difficulty in walking, not elsewhere classified (R26.2);Pain Pain - Right/Left: Right Pain - part of body:  (groin)    Time: 5409-8119 PT Time Calculation (min) (ACUTE ONLY): 17 min   Charges:   PT Evaluation $PT Eval Moderate Complexity: 1 Mod   PT General Charges $$ ACUTE PT VISIT: 1 Visit         Tish Forge, DPT Acute Rehabilitation Services Office [phone]  02/22/24 3:43 PM

## 2024-02-23 ENCOUNTER — Other Ambulatory Visit (HOSPITAL_COMMUNITY): Payer: Self-pay

## 2024-02-23 DIAGNOSIS — Z9889 Other specified postprocedural states: Secondary | ICD-10-CM

## 2024-02-23 DIAGNOSIS — Z95820 Peripheral vascular angioplasty status with implants and grafts: Secondary | ICD-10-CM

## 2024-02-23 LAB — POCT ACTIVATED CLOTTING TIME
Activated Clotting Time: 262 s
Activated Clotting Time: 199 s
Activated Clotting Time: 204 s
Activated Clotting Time: 124 s

## 2024-02-23 MED ORDER — OXYCODONE-ACETAMINOPHEN 5-325 MG PO TABS
1.0000 | ORAL_TABLET | ORAL | 0 refills | Status: DC | PRN
  Filled 2024-02-23: qty 10, 2d supply, fill #0

## 2024-02-23 NOTE — Progress Notes (Addendum)
  Progress Note    02/23/2024 8:02 AM 2 Days Post-Op  Subjective:  he says his soreness in the right groin is better. He walked down the hallway yesterday. He is ready to go home    Vitals:   02/23/24 0344 02/23/24 0759  BP: 132/70 134/75  Pulse:  75  Resp: 17 16  Temp: (!) 97.5 F (36.4 C) 97.9 F (36.6 C)  SpO2: (!) 8% 90%    Physical Exam: General:  resting comfortably Cardiac:  regular Lungs:  nonlabored Incisions:  right groin incision c/d/I without erythema Extremities:  palpable DP pulses   CBC    Component Value Date/Time   WBC 8.6 02/22/2024 0350   RBC 3.41 (L) 02/22/2024 0350   HGB 10.4 (L) 02/22/2024 0350   HGB 13.0 10/06/2023 1148   HCT 31.0 (L) 02/22/2024 0350   HCT 40.3 10/06/2023 1148   PLT 176 02/22/2024 0350   PLT 217 10/06/2023 1148   MCV 90.9 02/22/2024 0350   MCV 94 10/06/2023 1148   MCH 30.5 02/22/2024 0350   MCHC 33.5 02/22/2024 0350   RDW 12.4 02/22/2024 0350   RDW 12.3 10/06/2023 1148   LYMPHSABS 1.1 10/06/2023 1148   MONOABS 0.5 01/22/2020 1344   EOSABS 0.2 10/06/2023 1148   BASOSABS 0.1 10/06/2023 1148    BMET    Component Value Date/Time   NA 135 02/22/2024 0350   NA 137 10/06/2023 1148   K 4.3 02/22/2024 0350   CL 104 02/22/2024 0350   CO2 20 (L) 02/22/2024 0350   GLUCOSE 152 (H) 02/22/2024 0350   BUN 25 (H) 02/22/2024 0350   BUN 15 10/06/2023 1148   CREATININE 1.80 (H) 02/22/2024 0350   CALCIUM  9.0 02/22/2024 0350   GFRNONAA 41 (L) 02/22/2024 0350   GFRAA >60 01/22/2020 1232    INR    Component Value Date/Time   INR 1.0 02/16/2024 1030     Intake/Output Summary (Last 24 hours) at 02/23/2024 0802 Last data filed at 02/23/2024 0759 Gross per 24 hour  Intake 480 ml  Output 1075 ml  Net -595 ml      Assessment/Plan:  68 y.o. male is 2 days post op, s/p: right common femoral endarterectomy, right distal SFA and proximal SFA stenting    -He is feeling better this morning and is less sore in the right  groin -BLE warm and well perfused with palpable DP pulses -Right groin incision is intact and dry without signs of infection or bleeding -He has ambulated without issue and does not need PT/OT -Will discharge home today with incision check in 2-3 wks. Continue asa, plavix , and statin. I will send out a small prescription of pain medication to cover him until his routine medication is available   Deneise Finlay, PA-C [facility] [phone] 02/23/2024 8:02 AM   I have seen and evaluated the patient. I agree with the PA note as documented above.  Right groin looks good status post common femoral endarterectomy with antegrade right SFA above-knee popliteal stenting.  Bilateral DP pulses palpable.  Plan for discharge today with 2 to 3-week follow-up.  Aspirin  Plavix  statin.  [name], MD [facility] [place] Office: [phone]

## 2024-02-23 NOTE — Plan of Care (Signed)
  Problem: Education: Goal: Knowledge of General Education information will improve Description: Including pain rating scale, medication(s)/side effects and non-pharmacologic comfort measures Outcome: Adequate for Discharge   Problem: Health Behavior/Discharge Planning: Goal: Ability to manage health-related needs will improve Outcome: Adequate for Discharge   Problem: Clinical Measurements: Goal: Ability to maintain clinical measurements within normal limits will improve Outcome: Adequate for Discharge Goal: Will remain free from infection Outcome: Adequate for Discharge Goal: Diagnostic test results will improve Outcome: Adequate for Discharge Goal: Respiratory complications will improve Outcome: Adequate for Discharge Goal: Cardiovascular complication will be avoided Outcome: Adequate for Discharge   Problem: Activity: Goal: Risk for activity intolerance will decrease Outcome: Adequate for Discharge   Problem: Nutrition: Goal: Adequate nutrition will be maintained Outcome: Adequate for Discharge   Problem: Coping: Goal: Level of anxiety will decrease Outcome: Adequate for Discharge   Problem: Elimination: Goal: Will not experience complications related to bowel motility Outcome: Adequate for Discharge Goal: Will not experience complications related to urinary retention Outcome: Adequate for Discharge   Problem: Pain Managment: Goal: General experience of comfort will improve and/or be controlled Outcome: Adequate for Discharge   Problem: Safety: Goal: Ability to remain free from injury will improve Outcome: Adequate for Discharge   Problem: Skin Integrity: Goal: Risk for impaired skin integrity will decrease Outcome: Adequate for Discharge   Problem: Education: Goal: Knowledge of prescribed regimen will improve Outcome: Adequate for Discharge   Problem: Activity: Goal: Ability to tolerate increased activity will improve Outcome: Adequate for Discharge    Problem: Bowel/Gastric: Goal: Gastrointestinal status for postoperative course will improve Outcome: Adequate for Discharge   Problem: Clinical Measurements: Goal: Postoperative complications will be avoided or minimized Outcome: Adequate for Discharge Goal: Signs and symptoms of graft occlusion will improve Outcome: Adequate for Discharge   Problem: Skin Integrity: Goal: Demonstration of wound healing without infection will improve Outcome: Adequate for Discharge

## 2024-02-23 NOTE — TOC Transition Note (Signed)
 Transition of Care [facility]) - Discharge Note   Patient Details  Name: Carl [name] MRN: [id number] Date of Birth: November 29, 1955  Transition of Care [facility]) CM/SW Contact:  Jeani Mill, RN Phone Number: 02/23/2024, 9:20 AM   Clinical Narrative:    Daphane Dynes is stable to discharge home.  No TOC needs at this time.    Final next level of care: Home/Self Care Barriers to Discharge: Barriers Resolved   Patient Goals and CMS Choice Patient states their goals for this hospitalization and ongoing recovery are:: return home          Discharge Placement                 Home      Discharge Plan and Services Additional resources added to the After Visit Summary for                                       Social Drivers of Health (SDOH) Interventions SDOH Screenings   Food Insecurity: No Food Insecurity (02/21/2024)  Housing: Low Risk  (02/21/2024)  Transportation Needs: No Transportation Needs (02/21/2024)  Utilities: Not At Risk (02/21/2024)  Financial Resource Strain: Low Risk  (11/29/2023)   Received from [facility]  Physical Activity: Insufficiently Active (07/08/2023)   Received from [facility]  Social Connections: Moderately Integrated (02/21/2024)  Stress: No Stress Concern Present (07/08/2023)   Received from [facility]  Tobacco Use: Medium Risk (02/21/2024)     Readmission Risk Interventions    02/23/2024    9:19 AM  Readmission Risk Prevention Plan  Transportation Screening Complete  PCP or Specialist Appt within 5-7 Days Not Complete  Not Complete comments Specialist apt in 3 weeks  Home Care Screening Complete  Medication Review (RN CM) Complete

## 2024-02-23 NOTE — Progress Notes (Addendum)
 Mobility Specialist Progress Note:   02/23/24 0905  Mobility  Activity Ambulated with assistance in hallway;Ambulated with assistance in room  Level of Assistance Standby assist, set-up cues, supervision of patient - no hands on  Assistive Device Front wheel walker  Distance Ambulated (ft) 300 ft  Activity Response Tolerated well  Mobility Referral Yes  Mobility visit 1 Mobility  Mobility Specialist Start Time (ACUTE ONLY) [id number]  Mobility Specialist Stop Time (ACUTE ONLY) 0905  Mobility Specialist Time Calculation (min) (ACUTE ONLY) 7 min   Pt agreeable to mobility session, ambulated in hallway with RW and SV. Max HR 80 bpm. Tolerated well, asx throughout. Returned pt to room, eager for d/c.   Rhianne Soman Mobility Specialist Please contact via SecureChat or  Rehab office at [phone]

## 2024-02-23 NOTE — Discharge Instructions (Signed)
 [facility]  Discharge instructions  Lower Extremity Surgery  Please refer to the following instruction for your post-procedure care. Your surgeon or physician assistant will discuss any changes with you.  Activity  You are encouraged to walk as much as you can. You can slowly return to normal activities during the month after your surgery. Avoid strenuous activity and heavy lifting until your doctor tells you it's OK. Avoid activities such as vacuuming or swinging a golf club. Do not drive until your doctor give the OK and you are no longer taking prescription pain medications. It is also normal to have difficulty with sleep habits, eating and bowel movement after surgery. These will go away with time.  Bathing/Showering  Shower daily after you go home. Do not soak in a bathtub, hot tub, or swim until the incision heals completely.  Incision Care  Clean your incision with mild soap and water. Shower every day. Pat the area dry with a clean towel. You do not need a bandage unless otherwise instructed. Do not apply any ointments or creams to your incision. If you have open wounds you will be instructed how to care for them or a visiting nurse may be arranged for you. If you have staples or sutures along your incision they will be removed at your post-op appointment. You may have skin glue on your incision. Do not peel it off. It will come off on its own in about one week.  Wash the groin wound with soap and water daily and pat dry. (No tub bath-only shower)  Then put a dry gauze or washcloth in the groin to keep this area dry to help prevent wound infection.  Do this daily and as needed.  Do not use Vaseline or neosporin on your incisions.  Only use soap and water on your incisions and then protect and keep dry.  Diet  Resume your normal diet. There are no special food restrictions following this procedure. A low fat/ low cholesterol diet is recommended for  all patients with vascular disease. In order to heal from your surgery, it is CRITICAL to get adequate nutrition. Your body requires vitamins, minerals, and protein. Vegetables are the best source of vitamins and minerals. Vegetables also provide the perfect balance of protein. Processed food has little nutritional value, so try to avoid this.  Medications  Resume taking all your medications unless your doctor or physician assistant tells you not to. If your incision is causing pain, you may take over-the-counter pain relievers such as acetaminophen (Tylenol). If you were prescribed a stronger pain medication, please aware these medication can cause nausea and constipation. Prevent nausea by taking the medication with a snack or meal. Avoid constipation by drinking plenty of fluids and eating foods with high amount of fiber, such as fruits, vegetables, and grains. Take Colace 100 mg (an over-the-counter stool softener) twice a day as needed for constipation.  Do not take Tylenol if you are taking prescription pain medications.  Follow Up  Our office will schedule a follow up appointment 2-3 weeks following discharge.  Please call us immediately for any of the following conditions  Severe or worsening pain in your legs or feet while at rest or while walking  Increased pain, redness, warmth, or drainage (pus) from your incision site(s) Fever of 101 degree or higher   Reduce your risk of vascular disease  Stop smoking. If you would like help call [facility] at 1-800-QUIT-NOW (407-713-9373) or [facility]  at [phone].  Manage your cholesterol Maintain a desired weight Control your diabetes weight Control your diabetes Keep your blood pressure down  If you have any questions, please call the office at [phone]

## 2024-02-25 NOTE — Discharge Summary (Signed)
 Bypass Discharge Summary Patient ID: Carl [name] [id number] 67 y.o. [birth year]  Admit date: 02/21/2024  Discharge date and time: 02/23/2024  9:53 AM   Admitting Physician: [name], MD   Discharge Physician: [name], MD   Admission Diagnoses: Atherosclerosis of native artery of right lower extremity with intermittent claudication (HCC) [I70.211] Arteriosclerosis [I70.90] PAD (peripheral artery disease) (HCC) [I73.9]  Discharge Diagnoses: Atherosclerosis of native artery of right lower extremity with intermittent claudication (HCC) [I70.211] Arteriosclerosis [I70.90] PAD (peripheral artery disease) (HCC) [I73.9]  Admission Condition: fair  Discharged Condition: good  Indication for Admission: 68 year old male with disabling right lower extremity claudication with high-grade right common femoral stenosis as well as infrainguinal disease in the SFA popliteal artery.  He presents for right lower extremity revascularization after risks benefits discussed.  Assistant was needed given the complexity the case and also for wire and sheath exchange.   Hospital Course: The patient was admitted to the hospital on 02/21/2024 and underwent the following: 1.  Right common femoral endarterectomy with bovine pericardial patch angioplasty 2.  Ultrasound-guided access left common femoral artery 3.  Right iliac arteriogram with right lower extremity arteriogram including catheter selection of the right below-knee popliteal artery 4.  Angioplasty and stent of distal right SFA above-knee popliteal artery (7 mm x 150 mm Eluvia postdilated with a 6 mm Mustang) 5.  Angioplasty and stent of the proximal right SFA 7 (7 mm x 100 mm Eluvia postdilated with a 6 mm Mustang) 6. Mynx closure left common femoral artery  He tolerated the procedure well and was transferred to the PACU in stable condition.   On POD1 he was doing well. He had some right groin soreness but otherwise no pain. He was  ambulating without bleeding issues. His right groin was intact without hematoma. He was kept one more day for pain control.  On POD2 he was doing better and his pain was well controlled. He was ambulating, voiding, and tolerating a normal diet. He was discharged home.  Consults: None  Treatments: IV hydration, antibiotics: Ancef , analgesia: Dilaudid  and Percocet, anticoagulation: ASA, Plavix , and subcutaneous heparin , and surgery:   1.  Right common femoral endarterectomy with bovine pericardial patch angioplasty 2.  Ultrasound-guided access left common femoral artery 3.  Right iliac arteriogram with right lower extremity arteriogram including catheter selection of the right below-knee popliteal artery 4.  Angioplasty and stent of distal right SFA above-knee popliteal artery (7 mm x 150 mm Eluvia postdilated with a 6 mm Mustang) 5.  Angioplasty and stent of the proximal right SFA 7 (7 mm x 100 mm Eluvia postdilated with a 6 mm Mustang) 6. Mynx closure left common femoral artery    Disposition: Discharge disposition: 01-Home or Self Care       - For [facility] Registry use ---  Post-op:  Wound infection: No  Graft infection: No  Transfusion: No  If yes,  units given New Arrhythmia: No Patency judged by: [ ]  Dopper only, [ ]  Palpable graft pulse, [x ] Palpable distal pulse, [ ]  ABI inc. > 0.15, [ ]  Duplex D/C Ambulatory Status: Ambulatory  Complications: MI: [x ] No, [ ]  Troponin only, [ ]  EKG or Clinical CHF: No Resp failure: [x ] none, [ ]  Pneumonia, [ ]  Ventilator Chg in renal function: [x ] none, [ ]  Inc. Cr > 0.5, [ ]  Temp. Dialysis, [ ]  Permanent dialysis Stroke: [x ] None, [ ]  Minor, [ ]  Major Return to OR: No  Reason for  return to OR: [ ]  Bleeding, [ ]  Infection, [ ]  Thrombosis, [ ]  Revision  Discharge medications: Statin use:  Yes ASA use:  Yes Plavix  use:  Yes Beta blocker use: Yes Coumadin use: No  for medical reason not medically necessary    Patient  Instructions:  Allergies as of 02/23/2024   No Known Allergies      Medication List     STOP taking these medications    HYDROcodone -acetaminophen  5-325 MG tablet Commonly known as: NORCO/VICODIN       TAKE these medications    amiodarone  200 MG tablet Commonly known as: PACERONE  TAKE 1 TABLET BY MOUTH EVERY DAY   aspirin  EC 81 MG tablet Take 81 mg by mouth daily.   clopidogrel  75 MG tablet Commonly known as: PLAVIX  TAKE 1 TABLET BY MOUTH EVERY DAY   ezetimibe  10 MG tablet Commonly known as: ZETIA  TAKE 1 TABLET BY MOUTH EVERY DAY   fenofibrate  145 MG tablet Commonly known as: TRICOR  Take 145 mg by mouth daily.   gabapentin  100 MG capsule Commonly known as: Neurontin  Take 1 capsule (100 mg total) by mouth 3 (three) times daily.   levothyroxine  125 MCG tablet Commonly known as: SYNTHROID  Take 125 mcg by mouth daily before breakfast.   metoprolol  tartrate 25 MG tablet Commonly known as: LOPRESSOR  Take 0.5 tablets (12.5 mg total) by mouth 2 (two) times daily.   nitroGLYCERIN  0.4 MG SL tablet Commonly known as: NITROSTAT  Place 0.4 mg under the tongue every 5 (five) minutes as needed for chest pain.   oxyCODONE -acetaminophen  5-325 MG tablet Commonly known as: PERCOCET/ROXICET Take 1-2 tablets by mouth every 4 (four) hours as needed for moderate pain (pain score 4-6) or severe pain (pain score 7-10).   pantoprazole  40 MG tablet Commonly known as: PROTONIX  Take 40 mg by mouth daily.   rosuvastatin  40 MG tablet Commonly known as: CRESTOR  Take 40 mg by mouth every morning.   valsartan 160 MG tablet Commonly known as: DIOVAN Take 160 mg by mouth daily.               Discharge Care Instructions  (From admission, onward)           Start     Ordered   02/23/24 0000  Discharge wound care:       Comments: Wash your incision daily with antibacterial soap and water, then pat dry   02/23/24 9147           Activity: activity as tolerated, no  driving for 3 weeks, no driving while on analgesics, and no heavy lifting for 4 weeks Diet: regular diet Wound Care: keep wound clean and dry  Follow-up with [facility] in 2 weeks for incision check  Signed: Deneise Finlay, PA-C 02/25/2024 1:16 PM

## 2024-03-20 NOTE — Progress Notes (Unsigned)
 POST OPERATIVE OFFICE NOTE    CC:  F/u for surgery  HPI:  This is a 68 y.o. male who is s/p 1. Right common femoral endarterectomy with bovine pericardial patch angioplasty 2.  Ultrasound-guided access left common femoral artery 3.  Right iliac arteriogram with right lower extremity arteriogram including catheter selection of the right below-knee popliteal artery 4.  Angioplasty and stent of distal right SFA above-knee popliteal artery (7 mm x 150 mm Eluvia postdilated with a 6 mm Mustang) 5.  Angioplasty and stent of the proximal right SFA 7 (7 mm x 100 mm Eluvia postdilated with a 6 mm Mustang) 6. Mynx closure left common femoral artery on 02/21/24 by Dr. Gretta.  This was for disabling RLE claudication. He did well post operatively and was discharged home on 02/23/24.   Pt returns today for follow up.  Pt states overall he is doing very well. He no longer is having any pain in his right leg on ambulation. No pain at rest. No tissue loss. He feels his groin has healed very well. No concerns with his LLE. He is medically managed on Aspirin , Plavix , and Statin.   No Known Allergies  Current Outpatient Medications  Medication Sig Dispense Refill   amiodarone  (PACERONE ) 200 MG tablet TAKE 1 TABLET BY MOUTH EVERY DAY 90 tablet 3   aspirin  81 MG EC tablet Take 81 mg by mouth daily.     clopidogrel  (PLAVIX ) 75 MG tablet TAKE 1 TABLET BY MOUTH EVERY DAY 90 tablet 3   ezetimibe  (ZETIA ) 10 MG tablet TAKE 1 TABLET BY MOUTH EVERY DAY 90 tablet 3   fenofibrate  (TRICOR ) 145 MG tablet Take 145 mg by mouth daily.      gabapentin  (NEURONTIN ) 100 MG capsule Take 1 capsule (100 mg total) by mouth 3 (three) times daily. (Patient not taking: Reported on 02/10/2024) 30 capsule 0   levothyroxine  (SYNTHROID ) 125 MCG tablet Take 125 mcg by mouth daily before breakfast.     metoprolol  tartrate (LOPRESSOR ) 25 MG tablet Take 0.5 tablets (12.5 mg total) by mouth 2 (two) times daily. 90 tablet 3   nitroGLYCERIN  (NITROSTAT )  0.4 MG SL tablet Place 0.4 mg under the tongue every 5 (five) minutes as needed for chest pain.     oxyCODONE -acetaminophen  (PERCOCET/ROXICET) 5-325 MG tablet Take 1-2 tablets by mouth every 4 (four) hours as needed for moderate pain (pain score 4-6) or severe pain (pain score 7-10). 10 tablet 0   pantoprazole  (PROTONIX ) 40 MG tablet Take 40 mg by mouth daily.     rosuvastatin  (CRESTOR ) 40 MG tablet Take 40 mg by mouth every morning.      valsartan (DIOVAN) 160 MG tablet Take 160 mg by mouth daily.     No current facility-administered medications for this visit.     ROS:  See HPI  Physical Exam:  Vitals:   03/21/24 0940  BP: (!) 164/76  Pulse: (!) 55  Resp: 18  Temp: 98.4 F (36.9 C)  SpO2: 92%    General: well appearing, in no distress Cardiac: regular Lungs: non labored Incision:  right groin incision is well healed Extremities:  2+ femoral pulses bilaterally, BLE well perfused and warm with palpable DP pulses Neuro: alert and oriented Abdomen:  soft   Assessment/Plan:  This is a 68 y.o. male who is s/p:. Right common femoral endarterectomy with bovine pericardial patch angioplasty 2.  Ultrasound-guided access left common femoral artery 3.  Right iliac arteriogram with right lower extremity arteriogram including catheter selection  of the right below-knee popliteal artery 4.  Angioplasty and stent of distal right SFA above-knee popliteal artery (7 mm x 150 mm Eluvia postdilated with a 6 mm Mustang) 5.  Angioplasty and stent of the proximal right SFA 7 (7 mm x 100 mm Eluvia postdilated with a 6 mm Mustang) 6. Mynx closure left common femoral artery on 02/21/24 by Dr. Gretta.  This was for disabling RLE claudication. His claudication is resolved. He is without rest pain or tissue loss. Right groin incision has healed very well - continue walking regimen  - continue aspirin , plavix , statin - follow up in 6 weeks with RLE arterial duplex and ABI   Curry Damme, [facility] [phone]   Clinic MD:  [place]

## 2024-03-21 ENCOUNTER — Ambulatory Visit: Attending: Vascular Surgery | Admitting: Physician Assistant

## 2024-03-21 VITALS — BP 164/76 | HR 55 | Temp 98.4°F | Resp 18 | Ht 69.0 in | Wt 214.0 lb

## 2024-03-21 DIAGNOSIS — I70219 Atherosclerosis of native arteries of extremities with intermittent claudication, unspecified extremity: Secondary | ICD-10-CM

## 2024-03-22 ENCOUNTER — Other Ambulatory Visit: Payer: Self-pay | Admitting: *Deleted

## 2024-03-22 DIAGNOSIS — I70219 Atherosclerosis of native arteries of extremities with intermittent claudication, unspecified extremity: Secondary | ICD-10-CM

## 2024-04-28 ENCOUNTER — Encounter (HOSPITAL_COMMUNITY)

## 2024-05-02 ENCOUNTER — Ambulatory Visit: Admitting: Physician Assistant

## 2024-05-02 ENCOUNTER — Ambulatory Visit (HOSPITAL_COMMUNITY)
Admission: RE | Admit: 2024-05-02 | Discharge: 2024-05-02 | Disposition: A | Discharge status: Home / Self Care | Source: Ambulatory Visit | Attending: Vascular Surgery

## 2024-05-02 ENCOUNTER — Ambulatory Visit (HOSPITAL_COMMUNITY)
Admission: RE | Admit: 2024-05-02 | Discharge: 2024-05-02 | Disposition: A | Discharge status: Home / Self Care | Source: Ambulatory Visit | Attending: Vascular Surgery | Admitting: Vascular Surgery

## 2024-05-02 ENCOUNTER — Other Ambulatory Visit: Payer: Self-pay

## 2024-05-02 VITALS — BP 164/90 | HR 48 | Temp 97.9°F | Wt 223.6 lb

## 2024-05-02 DIAGNOSIS — I70219 Atherosclerosis of native arteries of extremities with intermittent claudication, unspecified extremity: Secondary | ICD-10-CM | POA: Diagnosis not present

## 2024-05-02 DIAGNOSIS — I70211 Atherosclerosis of native arteries of extremities with intermittent claudication, right leg: Secondary | ICD-10-CM | POA: Diagnosis not present

## 2024-05-02 DIAGNOSIS — I739 Peripheral vascular disease, unspecified: Secondary | ICD-10-CM
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
## 2024-05-03 LAB — VAS US ABI WITH/WO TBI
Left ABI: 0.93
Right ABI: 0.99
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
## 2024-06-02 ENCOUNTER — Encounter: Payer: Self-pay | Admitting: Nurse Practitioner

## 2024-06-02 ENCOUNTER — Ambulatory Visit: Attending: Nurse Practitioner | Admitting: Nurse Practitioner

## 2024-06-02 VITALS — BP 158/92 | HR 54 | Ht 69.0 in | Wt 229.2 lb

## 2024-06-02 DIAGNOSIS — R001 Bradycardia, unspecified: Secondary | ICD-10-CM

## 2024-06-02 DIAGNOSIS — I10 Essential (primary) hypertension: Secondary | ICD-10-CM | POA: Diagnosis not present

## 2024-06-02 DIAGNOSIS — I739 Peripheral vascular disease, unspecified: Secondary | ICD-10-CM | POA: Diagnosis not present

## 2024-06-02 DIAGNOSIS — E785 Hyperlipidemia, unspecified: Secondary | ICD-10-CM | POA: Diagnosis not present

## 2024-06-02 DIAGNOSIS — I2510 Atherosclerotic heart disease of native coronary artery without angina pectoris: Secondary | ICD-10-CM | POA: Diagnosis not present

## 2024-06-02 NOTE — Patient Instructions (Addendum)
 Medication Instructions:  STOP Amiodarone  (Pacerone ) WHEN YOU GET HOME USE YOUR INHALERS AND IF THAT DOES NOT WORK THEN YOU WILL NEED TO GO TO THE EMERGENCY ROOM FOR TREATMENT  *If you need a refill on your cardiac medications before your next appointment, please call your pharmacy*  Lab Work: None ordered If you have labs (blood work) drawn today and your tests are completely normal, you will receive your results only by: MyChart Message (if you have MyChart) OR A paper copy in the mail If you have any lab test that is abnormal or we need to change your treatment, we will call you to review the results.  Testing/Procedures: None Ordered  Follow-Up: At [facility], you and your health needs are our priority.  As part of our continuing mission to provide you with exceptional heart care, our providers are all part of one team.  This team includes your primary Cardiologist (physician) and Advanced Practice Providers or APPs (Physician Assistants and Nurse Practitioners) who all work together to provide you with the care you need, when you need it.  Your next appointment:   4 week(s)  Provider:   ANY APP    We recommend signing up for the patient portal called MyChart.  Sign up information is provided on this After Visit Summary.  MyChart is used to connect with patients for Virtual Visits (Telemedicine).  Patients are able to view lab/test results, encounter notes, upcoming appointments, etc.  Non-urgent messages can be sent to your provider as well.   To learn more about what you can do with MyChart, go to [email or web address].   Other Instructions Check your blood pressure daily for 1 week, then contact the office with your readings.  Contact the office either by phone or MyChart with your readings.  Make sure to check your blood pressure 2 hours after taking your medications.   AVOID these things for 30 minutes before checking your blood pressure: No Drinking  caffeine. No Drinking alcohol. No Eating. No Smoking. No Exercising.  Five minutes before checking your blood pressure: Pee. Sit in a dining chair. Avoid sitting in a soft couch or armchair. Be quiet. Do not talk.

## 2024-06-12 ENCOUNTER — Telehealth: Payer: Self-pay | Admitting: Cardiovascular Disease

## 2024-06-12 DIAGNOSIS — I10 Essential (primary) hypertension: Secondary | ICD-10-CM

## 2024-06-12 NOTE — Telephone Encounter (Signed)
 Carl [name] says call may be returned to the patient.

## 2024-06-12 NOTE — Telephone Encounter (Signed)
 Spoke with patient of Dr. Court - BP has been elevated over last several weeks (see below).   Denies chest pain and shortness of breath   Denies vision changes and headaches.   He cooks mostly - hamburgers, grilled cheese. No added salt.  He saw Jackee NP on 9/12 -- BP was elevated then.   Advised will send to MD and NP for review and possible med titration.

## 2024-06-12 NOTE — Telephone Encounter (Signed)
 Pt c/o BP issue: STAT if pt c/o blurred vision, one-sided weakness or slurred speech.   1. What is your BP concern?  Carl [name] just calling to report BP readings for the past week.  2. Have you taken any BP medication today? Yes   3. What are your last 5 BP readings? 9/13: 167/88 51 9/14: 185/75 55 9/15: 192/84 50 9/16: 179/81 51 9/17: 165/84 52 9/18: 176/83 55 9/19: 182/86 56 9/20: 172/80 51  4. Are you having any other symptoms (ex. Dizziness, headache, blurred vision, passed out)?  Weakness

## 2024-06-13 MED ORDER — VALSARTAN 320 MG PO TABS: 320.0000 mg | ORAL_TABLET | Freq: Every day | ORAL | 2 refills | Status: DC

## 2024-06-13 NOTE — Telephone Encounter (Signed)
 Wyn Jackee VEAR Mickey., NP to Me (Selected Message)     06/12/24  5:32 PM Please increase valsartan  to 320 mg daily and continue to monitor blood pressures over the next 2 weeks.  Please check BMET in 2 weeks and make sure to stay hydrated and abstain from excess salt in your diet.   Thanks, Jackee Wyn, NP

## 2024-06-13 NOTE — Telephone Encounter (Signed)
 Patient has been contacted and notified. He voiced understanding.   Labs ordered and rx has been sent to the pharmacy.

## 2024-06-27 ENCOUNTER — Other Ambulatory Visit: Payer: Self-pay | Admitting: *Deleted

## 2024-06-27 DIAGNOSIS — I10 Essential (primary) hypertension: Secondary | ICD-10-CM

## 2024-06-27 LAB — BASIC METABOLIC PANEL WITH GFR
BUN/Creatinine Ratio: 15 (ref 10–24)
BUN: 27 mg/dL (ref 8–27)
CO2: 21 mmol/L (ref 20–29)
Calcium: 9.9 mg/dL (ref 8.6–10.2)
Chloride: 97 mmol/L (ref 96–106)
Creatinine, Ser: 1.76 mg/dL — ABNORMAL HIGH (ref 0.76–1.27)
Glucose: 131 mg/dL — ABNORMAL HIGH (ref 70–99)
Potassium: 4.3 mmol/L (ref 3.5–5.2)
Sodium: 133 mmol/L — ABNORMAL LOW (ref 134–144)
eGFR: 42 mL/min/1.73 — ABNORMAL LOW (ref >59)

## 2024-06-28 ENCOUNTER — Ambulatory Visit (HOSPITAL_COMMUNITY): Payer: Self-pay | Admitting: Nurse Practitioner

## 2024-06-30 ENCOUNTER — Ambulatory Visit: Attending: Emergency Medicine | Admitting: Emergency Medicine

## 2024-06-30 ENCOUNTER — Encounter: Payer: Self-pay | Admitting: Emergency Medicine

## 2024-06-30 VITALS — BP 162/84 | HR 53 | Ht 69.0 in | Wt 227.3 lb

## 2024-06-30 DIAGNOSIS — R079 Chest pain, unspecified: Secondary | ICD-10-CM

## 2024-06-30 DIAGNOSIS — I10 Essential (primary) hypertension: Secondary | ICD-10-CM | POA: Diagnosis not present

## 2024-06-30 DIAGNOSIS — Z951 Presence of aortocoronary bypass graft: Secondary | ICD-10-CM

## 2024-06-30 DIAGNOSIS — R001 Bradycardia, unspecified: Secondary | ICD-10-CM

## 2024-06-30 DIAGNOSIS — I739 Peripheral vascular disease, unspecified: Secondary | ICD-10-CM

## 2024-06-30 DIAGNOSIS — I2510 Atherosclerotic heart disease of native coronary artery without angina pectoris: Secondary | ICD-10-CM | POA: Diagnosis not present

## 2024-06-30 DIAGNOSIS — E785 Hyperlipidemia, unspecified: Secondary | ICD-10-CM

## 2024-06-30 DIAGNOSIS — I77819 Aortic ectasia, unspecified site: Secondary | ICD-10-CM

## 2024-06-30 LAB — BASIC METABOLIC PANEL WITH GFR
BUN/Creatinine Ratio: 10 (ref 10–24)
BUN: 20 mg/dL (ref 8–27)
CO2: 21 mmol/L (ref 20–29)
Calcium: 9.9 mg/dL (ref 8.6–10.2)
Chloride: 99 mmol/L (ref 96–106)
Creatinine, Ser: 1.94 mg/dL — ABNORMAL HIGH (ref 0.76–1.27)
Glucose: 100 mg/dL — ABNORMAL HIGH (ref 70–99)
Potassium: 4.4 mmol/L (ref 3.5–5.2)
Sodium: 136 mmol/L (ref 134–144)
eGFR: 37 mL/min/1.73 — ABNORMAL LOW (ref >59)

## 2024-06-30 MED ORDER — AMLODIPINE BESYLATE 5 MG PO TABS: 5.0000 mg | ORAL_TABLET | Freq: Every day | ORAL | 3 refills | Status: DC

## 2024-06-30 MED ORDER — CARVEDILOL 3.125 MG PO TABS: 3.1250 mg | ORAL_TABLET | Freq: Two times a day (BID) | ORAL | 3 refills | Status: AC

## 2024-06-30 NOTE — Patient Instructions (Signed)
 Medication Instructions:  Your physician has recommended you make the following change in your medication:  STOP Metoprolol   START Carvedilol (coreg) 3.125 taking 1 twice a day  START Amlodipine  5 mg taking 1 every morning  *If you need a refill on your cardiac medications before your next appointment, please call your pharmacy*  Lab Work: TODAY:  BMET  If you have labs (blood work) drawn today and your tests are completely normal, you will receive your results only by: MyChart Message (if you have MyChart) OR A paper copy in the mail If you have any lab test that is abnormal or we need to change your treatment, we will call you to review the results.  Testing/Procedures: Your physician has requested that you have an echocardiogram. Echocardiography is a painless test that uses sound waves to create images of your heart. It provides your doctor with information about the size and shape of your heart and how well your heart's chambers and valves are working. This procedure takes approximately one hour. There are no restrictions for this procedure. Please do NOT wear cologne, perfume, aftershave, or lotions (deodorant is allowed). Please arrive 15 minutes prior to your appointment time.  Please note: We ask at that you not bring children with you during ultrasound (echo/ vascular) testing. Due to room size and safety concerns, children are not allowed in the ultrasound rooms during exams. Our front office staff cannot provide observation of children in our lobby area while testing is being conducted. An adult accompanying a patient to their appointment will only be allowed in the ultrasound room at the discretion of the ultrasound technician under special circumstances. We apologize for any inconvenience.   Follow-Up: At [facility], you and your health needs are our priority.  As part of our continuing mission to provide you with exceptional heart care, our providers are all part of  one team.  This team includes your primary Cardiologist (physician) and Advanced Practice Providers or APPs (Physician Assistants and Nurse Practitioners) who all work together to provide you with the care you need, when you need it.  Your next appointment:   4 week(s)  Provider:   Lum Louis, NP          We recommend signing up for the patient portal called MyChart.  Sign up information is provided on this After Visit Summary.  MyChart is used to connect with patients for Virtual Visits (Telemedicine).  Patients are able to view lab/test results, encounter notes, upcoming appointments, etc.  Non-urgent messages can be sent to your provider as well.   To learn more about what you can do with MyChart, go to [email or web address].   Other Instructions

## 2024-06-30 NOTE — Progress Notes (Signed)
 Cardiology Office Note:    Date:  06/30/2024  ID:  Carl [name], DOB [birth year], MRN [id number] PCP: Lenon Boyer, FNP  [facility] Providers Cardiologist:  Dorn Lesches, MD Cardiology APP:  Rana Lum CROME, NP       Patient Profile:       Chief Complaint: 1 month follow-up for hypertension History of Present Illness:  Carl [name] is a 68 y.o. male with visit-pertinent history of coronary artery disease s/p CABG x 3 on 07/2022 (LIMA to LAD, vein to diagonal branch and PDA), hypertension, hyperlipidemia, tobacco abuse, PAD s/p iliofemoral enterectomy and with patch and angioplasty, CVA, hypothyroidism, mild dilation of aorta  He has history of CAD with initial catheterization performed in 2011 showing 40-50% to mid distal RCA with mild irregularities and vasospasm.  He was seen in follow-up on 06/2022 with complaints of chest pain underwent stress testing that was low risk.  He suffered an NSTEMI on 07/2022 with LHC performed and subsequently underwent CABG times 3 on 07/30/22 with free LIMA to LAD, SVG to diagonal, and SVG to PDA.  He did experience rate controlled episodes of atrial fibrillation.  Due to underlying lung disease amiodarone  was not initiated.  Was placed on Plavix  for NSTEMI.  He was referred to Dr. Lesches on 09/04/2022 for evaluation of PAD.  He completed an abdominal aortogram and was evaluated by Dr. Gretta with subsequent left iliofemoral endarterectomy with patch and angioplasty completed on 03/12/2023.  He underwent a left SFA intervention on 09/16/2023 that was unsuccessful due to inability to cross the mid left SFA due to a CTO.  He returned on 10/20/2023 and underwent successful crossing the CTO of the stent on 10/20/2023 by Dr. Darron.  He was then seen by his PCP on 05/26/2024 and noted to have bradycardia with a heart rate of 46.  He was last seen in clinic on 06/02/2024 with complaints of bradycardia.  His amiodarone  was discontinued.  He was continued on  metoprolol  12.5 mg twice daily.  He called in on 06/12/2024 reporting blood pressures remain elevated.  His valsartan  was increased to 320 mg daily.   Discussed the use of AI scribe software for clinical note transcription with the patient, who gave verbal consent to proceed.  History of Present Illness Carl [name] is a 68 year old male with hypertension and coronary artery disease who presents with high blood pressure and low heart rate.  He experiences consistently high blood pressure with recent recorded reading of 200/90 at home.  He occasionally has chest pain when his blood pressure is elevated, but not during physical activity.  Usually at rest when he notices his blood pressure to be elevated.  He stays fairly active working on his car and tractor without exertional chest pains.  His heart rates are averaging in the upper 50s to low 60s at home.  He denies any syncope, presyncope, lightheadedness or dizziness.  He was previously on amiodarone , which was discontinued. Current medications include metoprolol  12.5 mg twice daily and valsartan  320 mg daily. His son ensures regular medication intake.   A recent CT scan of the lungs prompted the clinic to inquire about his heart doctor due to findings of enlarged heart.  He can walk about 600 feet before experiencing hip and lower back pain.  He denies any claudication.  He occasionally experiences shortness of breath but uses a breathing device as needed. He is retired and spends his days working on his car, truck, or  tractor.  No orthopnea, PND, LEE, melena, hematochezia.  Review of systems:  Please see the history of present illness. All other systems are reviewed and otherwise negative.      Studies Reviewed:        Cardiac catheterization 07/28/2022   Prox RCA to Mid RCA lesion is 65% stenosed.   Mid RCA lesion is 90% stenosed.  Irregular, eccentric calcified   Dist RCA lesion is 95% stenosed. ->  Almost channel due to eccentric  location   Mid LAD-1 lesion is 90% stenosed with 30% stenosed side branch in 1st Diag.  Heavily calcified   Mid LAD-2 lesion is 80% stenosed.  Heavily calcified   Dist LAD lesion is 90% stenosed.  Heavily calcified   1st Diag lesion is 70% stenosed.   ----------------------------------   LV end diastolic pressure is mildly elevated.   There is no aortic valve stenosis.   POST-OPERATIVE DIAGNOSIS:   Severe heavily calcified two-vessel CAD:  LAD 90% eccentric/irregular stenosis at major D1 branch that has diffuse disease as well.  This lesion is then followed by tandem 80% and 90% eccentric calcified lesions.  Relatively long segment of diffuse disease heavily calcified; downstream LAD beyond this lesion is relatively free of disease. Highly calcified RCA with mid segment smooth heavily calcified eccentric 60 to 70% stenosis followed by a 90% irregular almost ulcerated calcified plaque just at the been followed by a 99% very eccentric lesion just prior to the bifurcation into the PDA and PL.  The downstream vessels are relatively free of disease. Relatively normal LCx with a major lateral OM 1, relatively small OM 2 and small LPL 1. Mildly elevated LVEDP; normal EF by Echo earlier this month. Diagnostic Dominance: Right  Echocardiogram 07/10/2022  1. Left ventricular ejection fraction, by estimation, is 60 to 65%. The  left ventricle has normal function. The left ventricle has no regional  wall motion abnormalities. Left ventricular diastolic parameters are  consistent with Grade I diastolic  dysfunction (impaired relaxation).   2. Right ventricular systolic function is normal. The right ventricular  size is normal.   3. The mitral valve is normal in structure. Trivial mitral valve  regurgitation. No evidence of mitral stenosis.   4. The aortic valve is tricuspid. Aortic valve regurgitation is not  visualized. Aortic valve sclerosis is present, with no evidence of aortic  valve stenosis.    5. Aortic dilatation noted. There is mild dilatation of the aortic root,  measuring 40 mm. There is borderline dilatation of the ascending aorta,  measuring 39 mm.   6. The inferior vena cava is normal in size with greater than 50%  respiratory variability, suggesting right atrial pressure of 3 mmHg.   Risk Assessment/Calculations:     HYPERTENSION CONTROL Vitals:   06/30/24 0756 06/30/24 0828  BP: (!) 170/100 (!) 162/84    The patient's blood pressure is elevated above target today.  In order to address the patient's elevated BP: A new medication was prescribed today.; A current anti-hypertensive medication was adjusted today.           Physical Exam:   VS:  BP (!) 162/84   Pulse (!) 53   Ht 5' 9 (1.753 m)   Wt 227 lb 4.8 oz (103.1 kg)   SpO2 98%   BMI 33.57 kg/m    Wt Readings from Last 3 Encounters:  06/30/24 227 lb 4.8 oz (103.1 kg)  06/02/24 229 lb 3.2 oz (104 kg)  05/02/24 223 lb 9.6 oz (  101.4 kg)    GEN: Well nourished, well developed in no acute distress NECK: No JVD; No carotid bruits CARDIAC: RRR, no murmurs, rubs, gallops RESPIRATORY:  Clear to auscultation without rales, wheezing or rhonchi  ABDOMEN: Soft, non-tender, non-distended EXTREMITIES:  No edema; No acute deformity      Assessment and Plan:  Coronary artery disease S/p CABG x 3 in 2023 - Today he is stable without exertional symptoms.  Mentions he will have some chest pressure when his blood pressure is elevated usually in the 160s or higher.  Frequently works on his tractor and car and is fairly active without any exertional chest pains.  No active angina.  Will repeat echocardiogram to reassess his EF and RWMA - Continue aspirin  81 mg daily and clopidogrel  75 mg daily - Continue rosuvastatin  40 mg daily, ezetimibe  10 mg daily, and fenofibrate  145 mg  Hypertension Blood pressure today is elevated at 162/84 and uncontrolled He reports home readings up to 200s Most recent CT lung cancer  screening 05/2024 showed enlarged heart size - Plan for echocardiogram to evaluate LVH - Plan to discontinue metoprolol  tartrate 25 mg twice daily - Start carvedilol 3.125 mg twice daily and amlodipine  5 mg daily - Continue valsartan  320 mg daily  Bradycardia Amiodarone  previously discontinued Heart rate at home averages 55-65 BPM - Heart rate today is 53 bpm.  He is without lightheadedness, dizziness, syncope or presyncope.  May need to discontinue beta-blocker in future if becomes symptomatic  Peripheral arterial disease S/p left SFA angioplasty with stenting on 10/20/2023 Most recently on 02/21/2024 underwent right common femoral endarterectomy with bovine pericardial patch angioplasty, RLE angiogram with angioplasty and stenting of the right proximal and also distal SFA and above knee popliteal artery by Dr. Gretta.  - Today he denies claudication - Management per VVS  Hyperlipidemia LDL 39 on 02/2024 and well-controlled - Continue rosuvastatin  40 mg daily, ezetimibe  10 mg daily, and fenofibrate  145 mg daily  Dilation of aorta Echocardiogram 06/2022 with mild dilation of the aortic root measuring 40 mm and borderline dilation of ascending aorta measuring 39 mm - Repeat echocardiogram today for routine monitoring  Postop A-fib Amiodarone  discontinued at last OV Today he appears to maintain sinus rhythm per auscultation - He denies any symptoms concerning for recurrent atrial fibrillation.  No palpitations      Dispo:  Return in about 1 month (around 07/31/2024).  Signed, Lum LITTIE Louis, NP

## 2024-07-04 ENCOUNTER — Ambulatory Visit: Payer: Self-pay | Admitting: Emergency Medicine

## 2024-07-04 DIAGNOSIS — Z79899 Other long term (current) drug therapy: Secondary | ICD-10-CM

## 2024-07-05 ENCOUNTER — Other Ambulatory Visit: Payer: Self-pay | Admitting: Emergency Medicine

## 2024-07-05 DIAGNOSIS — I77819 Aortic ectasia, unspecified site: Secondary | ICD-10-CM

## 2024-07-05 DIAGNOSIS — E785 Hyperlipidemia, unspecified: Secondary | ICD-10-CM

## 2024-07-05 DIAGNOSIS — R079 Chest pain, unspecified: Secondary | ICD-10-CM

## 2024-07-05 DIAGNOSIS — Z951 Presence of aortocoronary bypass graft: Secondary | ICD-10-CM

## 2024-07-05 DIAGNOSIS — I2510 Atherosclerotic heart disease of native coronary artery without angina pectoris: Secondary | ICD-10-CM

## 2024-07-05 DIAGNOSIS — I10 Essential (primary) hypertension: Secondary | ICD-10-CM

## 2024-07-05 DIAGNOSIS — I739 Peripheral vascular disease, unspecified: Secondary | ICD-10-CM

## 2024-07-05 DIAGNOSIS — R001 Bradycardia, unspecified: Secondary | ICD-10-CM

## 2024-07-13 ENCOUNTER — Ambulatory Visit (HOSPITAL_BASED_OUTPATIENT_CLINIC_OR_DEPARTMENT_OTHER)
Admission: RE | Admit: 2024-07-13 | Discharge: 2024-07-13 | Disposition: A | Discharge status: Home / Self Care | Source: Ambulatory Visit | Attending: Emergency Medicine | Admitting: Emergency Medicine

## 2024-07-13 DIAGNOSIS — R079 Chest pain, unspecified: Secondary | ICD-10-CM

## 2024-07-13 DIAGNOSIS — I340 Nonrheumatic mitral (valve) insufficiency: Secondary | ICD-10-CM

## 2024-07-13 DIAGNOSIS — I77819 Aortic ectasia, unspecified site: Secondary | ICD-10-CM

## 2024-07-13 DIAGNOSIS — R001 Bradycardia, unspecified: Secondary | ICD-10-CM

## 2024-07-13 LAB — ECHOCARDIOGRAM COMPLETE
AR max vel: 2.82 cm2
AV Area VTI: 2.98 cm2
AV Area mean vel: 2.93 cm2
AV Mean grad: 5 mmHg
AV Peak grad: 10.9 mmHg
Ao pk vel: 1.65 m/s
Area-P 1/2: 4.06 cm2
Calc EF: 53.8 %
MV M vel: 1.43 m/s
MV Peak grad: 8.2 mmHg
S' Lateral: 2.8 cm
Single Plane A2C EF: 51.5 %
Single Plane A4C EF: 54.8 %

## 2024-07-17 ENCOUNTER — Ambulatory Visit: Payer: Self-pay | Admitting: Emergency Medicine

## 2024-07-20 NOTE — Progress Notes (Signed)
Patient has been notified directly; all questions, if any, were answered. Patient voiced understanding.   

## 2024-07-28 ENCOUNTER — Encounter: Payer: Self-pay | Admitting: Emergency Medicine

## 2024-07-28 ENCOUNTER — Ambulatory Visit: Attending: Emergency Medicine | Admitting: Emergency Medicine

## 2024-07-28 VITALS — BP 152/88 | HR 61 | Ht 69.0 in | Wt 226.0 lb

## 2024-07-28 DIAGNOSIS — Z951 Presence of aortocoronary bypass graft: Secondary | ICD-10-CM

## 2024-07-28 DIAGNOSIS — I77819 Aortic ectasia, unspecified site: Secondary | ICD-10-CM | POA: Diagnosis not present

## 2024-07-28 DIAGNOSIS — R001 Bradycardia, unspecified: Secondary | ICD-10-CM | POA: Diagnosis not present

## 2024-07-28 DIAGNOSIS — I2510 Atherosclerotic heart disease of native coronary artery without angina pectoris: Secondary | ICD-10-CM | POA: Diagnosis not present

## 2024-07-28 DIAGNOSIS — I340 Nonrheumatic mitral (valve) insufficiency: Secondary | ICD-10-CM

## 2024-07-28 DIAGNOSIS — N1832 Chronic kidney disease, stage 3b: Secondary | ICD-10-CM

## 2024-07-28 DIAGNOSIS — I10 Essential (primary) hypertension: Secondary | ICD-10-CM

## 2024-07-28 DIAGNOSIS — E785 Hyperlipidemia, unspecified: Secondary | ICD-10-CM

## 2024-07-28 DIAGNOSIS — R072 Precordial pain: Secondary | ICD-10-CM | POA: Diagnosis not present

## 2024-07-28 DIAGNOSIS — I739 Peripheral vascular disease, unspecified: Secondary | ICD-10-CM

## 2024-07-28 LAB — CBC

## 2024-07-28 MED ORDER — ISOSORBIDE MONONITRATE ER 30 MG PO TB24: 30.0000 mg | ORAL_TABLET | Freq: Every day | ORAL | 2 refills | Status: DC

## 2024-07-28 MED ORDER — AMLODIPINE BESYLATE 10 MG PO TABS: 10.0000 mg | ORAL_TABLET | Freq: Every day | ORAL | 3 refills | Status: DC

## 2024-07-28 NOTE — Progress Notes (Addendum)
 Cardiology Office Note:    Date:  07/28/2024  ID:  Carl [name] Mon, DOB [birth year], MRN [id number] PCP: Lenon Boyer, FNP  [facility] Providers Cardiologist:  Dorn Lesches, MD Cardiology APP:  Rana Lum CROME, NP       Patient Profile:       Chief Complaint: 1 month follow-up History of Present Illness:  Carl [name] is a 68 y.o. male with visit-pertinent history of coronary artery disease s/p CABG x 3 on 07/2022 (LIMA to LAD, vein to diagonal branch and PDA), hypertension, hyperlipidemia, tobacco abuse, PAD s/p iliofemoral enterectomy and with patch and angioplasty, CVA, hypothyroidism, mild dilation of aorta   He has history of CAD with initial catheterization performed in 2011 showing 40-50% to mid distal RCA with mild irregularities and vasospasm.  He was seen in follow-up on 06/2022 with complaints of chest pain underwent stress testing that was low risk.  He suffered an NSTEMI on 07/2022 with LHC performed and subsequently underwent CABG x3 on 07/30/22 with free LIMA to LAD, SVG to diagonal, and SVG to PDA.  He did experience rate controlled episodes of atrial fibrillation.  Due to underlying lung disease amiodarone  was not initiated.  Was placed on Plavix  for NSTEMI.  He was referred to Dr. Lesches on 09/04/2022 for evaluation of PAD.  He completed an abdominal aortogram and was evaluated by Dr. Gretta with subsequent left iliofemoral endarterectomy with patch and angioplasty completed on 03/12/2023.  He underwent a left SFA intervention on 09/16/2023 that was unsuccessful due to inability to cross the mid left SFA due to a CTO.  He returned on 10/20/2023 and underwent successful crossing the CTO of the stent on 10/20/2023 by Dr. Darron.  He was then seen by his PCP on 05/26/2024 and noted to have bradycardia with a heart rate of 46.   Seen in clinic on 06/02/2024 with complaints of bradycardia.  His amiodarone  was discontinued.  He was maintaining NSR.  He was continued on metoprolol   12.5 mg twice daily.   He called in on 06/12/2024 reporting blood pressures remain elevated.  His valsartan  was increased to 320 mg daily.  Last seen in clinic on 06/30/2024.  Reporting to consistently experience high blood pressure readings at home.  His metoprolol  tartrate was discontinued he was started on carvedilol 3.125 mg twice daily and amlodipine  5 mg daily.  Echocardiogram was ordered and completed on 07/13/2024 showing LVEF 55 to 60%, no RWMA, mild LVH, indeterminate diastolic parameters, RV function and size normal, mild mitral valve regurgitation.   Discussed the use of AI scribe software for clinical note transcription with the patient, who gave verbal consent to proceed.  History of Present Illness He experienced severe chest pain on Tuesday morning from 2:30 AM to 4:00 AM, which was constant and woke him from sleep. He took three nitroglycerin  tablets before the pain subsided. This episode was the worst he has experienced, although he has had intermittent chest pains weekly, typically short and resolving quickly. He often experiences chest pain in the evenings and at night. There is no radiation of chest pain to the neck or arms. Occasional shortness of breath is noted.  Current medications include carvedilol twice daily, amlodipine  in the morning, and valsartan  in the morning. He also takes aspirin  and Plavix  as well as rosuvastatin .  Home blood pressure readings are significantly higher than those recorded in the clinic, with some readings as high as 200/101 and averaging in the 170s. He did not bring his  blood pressure cuff to the appointment, and his family members use it as well.  He denies syncope, presyncope, lightheadedness, dizziness, orthopnea, PND, LEE, palpitations  Review of systems:  Please see the history of present illness. All other systems are reviewed and otherwise negative.      Studies Reviewed:    EKG Interpretation Date/Time:  Friday July 28 2024  08:25:55 EST Ventricular Rate:  57 PR Interval:  206 QRS Duration:  102 QT Interval:  466 QTC Calculation: 453 R Axis:   25  Text Interpretation: Sinus bradycardia Marked ST abnormality, possible lateral subendocardial injury When compared with ECG of 02-Jun-2024 10:23, No significant change was found Confirmed by Rana Dixon [phone]) on 07/28/2024 8:29:18 AM   Echocardiogram 07/13/2024 1. Left ventricular ejection fraction, by estimation, is 55 to 60%. The  left ventricle has normal function. The left ventricle has no regional  wall motion abnormalities. There is mild left ventricular hypertrophy.  Left ventricular diastolic parameters  are indeterminate. The average left ventricular global longitudinal strain  is -11.9 %. The global longitudinal strain is abnormal.   2. Right ventricular systolic function is normal. The right ventricular  size is normal.   3. The mitral valve is normal in structure. Mild mitral valve  regurgitation. No evidence of mitral stenosis.   4. The aortic valve is normal in structure. Aortic valve regurgitation is  not visualized. No aortic stenosis is present.   5. The inferior vena cava is normal in size with greater than 50%  respiratory variability, suggesting right atrial pressure of 3 mmHg.   Cardiac catheterization 07/28/2022   Prox RCA to Mid RCA lesion is 65% stenosed.   Mid RCA lesion is 90% stenosed.  Irregular, eccentric calcified   Dist RCA lesion is 95% stenosed. ->  Almost channel due to eccentric location   Mid LAD-1 lesion is 90% stenosed with 30% stenosed side branch in 1st Diag.  Heavily calcified   Mid LAD-2 lesion is 80% stenosed.  Heavily calcified   Dist LAD lesion is 90% stenosed.  Heavily calcified   1st Diag lesion is 70% stenosed.   ----------------------------------   LV end diastolic pressure is mildly elevated.   There is no aortic valve stenosis.   POST-OPERATIVE DIAGNOSIS:   Severe heavily calcified two-vessel CAD:   LAD 90% eccentric/irregular stenosis at major D1 branch that has diffuse disease as well.  This lesion is then followed by tandem 80% and 90% eccentric calcified lesions.  Relatively long segment of diffuse disease heavily calcified; downstream LAD beyond this lesion is relatively free of disease. Highly calcified RCA with mid segment smooth heavily calcified eccentric 60 to 70% stenosis followed by a 90% irregular almost ulcerated calcified plaque just at the been followed by a 99% very eccentric lesion just prior to the bifurcation into the PDA and PL.  The downstream vessels are relatively free of disease. Relatively normal LCx with a major lateral OM 1, relatively small OM 2 and small LPL 1. Mildly elevated LVEDP; normal EF by Echo earlier this month. Diagnostic Dominance: Right  Echocardiogram 07/10/2022  1. Left ventricular ejection fraction, by estimation, is 60 to 65%. The  left ventricle has normal function. The left ventricle has no regional  wall motion abnormalities. Left ventricular diastolic parameters are  consistent with Grade I diastolic  dysfunction (impaired relaxation).   2. Right ventricular systolic function is normal. The right ventricular  size is normal.   3. The mitral valve is normal in structure. Trivial mitral  valve  regurgitation. No evidence of mitral stenosis.   4. The aortic valve is tricuspid. Aortic valve regurgitation is not  visualized. Aortic valve sclerosis is present, with no evidence of aortic  valve stenosis.   5. Aortic dilatation noted. There is mild dilatation of the aortic root,  measuring 40 mm. There is borderline dilatation of the ascending aorta,  measuring 39 mm.   6. The inferior vena cava is normal in size with greater than 50%  respiratory variability, suggesting right atrial pressure of 3 mmHg.   Risk Assessment/Calculations:         HYPERTENSION CONTROL Vitals:   07/28/24 0754 07/28/24 0817  BP: 130/82 (!) 152/88    The  patient's blood pressure is elevated above target today.  In order to address the patient's elevated BP: A new medication was prescribed today.; A current anti-hypertensive medication was adjusted today.           Physical Exam:   VS:  BP (!) 152/88   Pulse 61   Ht 5' 9 (1.753 m)   Wt 226 lb (102.5 kg)   SpO2 97%   BMI 33.37 kg/m    Wt Readings from Last 3 Encounters:  07/28/24 226 lb (102.5 kg)  06/30/24 227 lb 4.8 oz (103.1 kg)  06/02/24 229 lb 3.2 oz (104 kg)    GEN: Well nourished, well developed in no acute distress NECK: No JVD; No carotid bruits CARDIAC: RRR, no murmurs, rubs, gallops RESPIRATORY:  Clear to auscultation without rales, wheezing or rhonchi  ABDOMEN: Soft, non-tender, non-distended EXTREMITIES:  No edema; No acute deformity      Assessment and Plan:  Coronary artery disease S/p CABG x 3 in 2023 Echocardiogram 06/2024 with LVEF 55 to 60% and no RWMA - Today he reports intermittent chest pains that occur weekly.  These are typically short and resolve quickly.  Not associated with exertion.  Did note this week to have a severe episode of chest pain awaking him from sleep.  He took 3 nitroglycerin  tablets before the pain subsided.  The pain was left-sided and was without radiation and associated with dyspnea - EKG today without acute ischemic changes - Plan for Lexiscan  Myoview  for further ischemic evaluation - Continue aspirin  81 mg daily and clopidogrel  75 mg daily - Continue rosuvastatin  40 mg daily, ezetimibe  10 mg daily, and fenofibrate  145 mg - Continue carvedilol 3.125 mg twice daily - BMET and CBC today   Hypertension Blood pressure today is elevated at 152/88 and uncontrolled Home blood pressure readings averaging in the 170s and up to the 200s.  Does remain adherent to medication regimen Echocardiogram 06/2024 showed mild LVH - Unable to increase beta-blocker given baseline bradycardia - Start isosorbide  30 mg daily and increase amlodipine  to  10 mg daily - Continue valsartan  320 mg daily - Continue carvedilol 3.125 mg twice daily   Bradycardia Amiodarone  previously discontinued Heart rate at home averages 55-65  range - Heart rate today is 61 bpm  - He is without symptoms of symptomatic bradycardia - Continue to clinically monitor   Peripheral arterial disease S/p left SFA angioplasty with stenting on 10/20/2023 Most recently on 02/21/2024 underwent right common femoral endarterectomy with bovine pericardial patch angioplasty, RLE angiogram with angioplasty and stenting of the right proximal and also distal SFA and above knee popliteal artery by Dr. Gretta.  - Today he reports stable claudication symptoms - Management per [facility]   Hyperlipidemia LDL 39 and TG 131 on 02/2024 and well-controlled -  Continue rosuvastatin  40 mg daily, ezetimibe  10 mg daily, and fenofibrate  145 mg daily   Dilation of aorta Echocardiogram 06/2022 with mild dilation of the aortic root measuring 40 mm and borderline dilation of ascending aorta measuring 39 mm Repeat echocardiogram on 06/2024 showed aortic root is normal in size and structure - No further imaging warranted at this time   Postop A-fib Experienced rate controlled episodes postop CABG without reoccurrence Amiodarone  discontinued due to bradycardia on 06/02/2024 - Maintaining sinus rhythm on EKG today - He denies any symptoms concerning for recurrent atrial fibrillation.  No palpitations  CKD stage IIIb Creatinine 1.94 and GFR 37 on 06/2024 - Repeat BMET today - He has been referred to nephrology  Mild mitral valve regurgitation Echocardiogram 06/2024 showed mild mitral valve regurgitation - No interventions warranted at this time    Informed Consent   Shared Decision Making/Informed Consent The risks [chest pain, shortness of breath, cardiac arrhythmias, dizziness, blood pressure fluctuations, myocardial infarction, stroke/transient ischemic attack, nausea, vomiting, allergic  reaction, radiation exposure, metallic taste sensation and life-threatening complications (estimated to be 1 in 10,000)], benefits (risk stratification, diagnosing coronary artery disease, treatment guidance) and alternatives of a nuclear stress test were discussed in detail with Mr. Arment and he agrees to proceed.     Dispo:  Return in about 1 month (around 08/27/2024).  Signed, Lum LITTIE Louis, NP

## 2024-07-28 NOTE — Patient Instructions (Signed)
 Medication Instructions:  START Imdur  (isosorbide ) 30mg  Take 1 tablet once a day  INCREASE Norvasc  (Amlodipine ) to 10mg  Take 1 tablet once a day (if you have any of the 5mg  tablets left you can take 2 (two) tablets to use them up) *If you need a refill on your cardiac medications before your next appointment, please call your pharmacy*  Lab Work: None ordered If you have labs (blood work) drawn today and your tests are completely normal, you will receive your results only by: MyChart Message (if you have MyChart) OR A paper copy in the mail If you have any lab test that is abnormal or we need to change your treatment, we will call you to review the results.  Testing/Procedures: Your physician has requested that you have a lexiscan  myoview . For further information please visit [email or web address] Please follow instruction sheet, as given.   Follow-Up: At [facility], you and your health needs are our priority.  As part of our continuing mission to provide you with exceptional heart care, our providers are all part of one team.  This team includes your primary Cardiologist (physician) and Advanced Practice Providers or APPs (Physician Assistants and Nurse Practitioners) who all work together to provide you with the care you need, when you need it.  Your next appointment:   1 month(s)  Provider:   Lum Louis, NP       We recommend signing up for the patient portal called MyChart.  Sign up information is provided on this After Visit Summary.  MyChart is used to connect with patients for Virtual Visits (Telemedicine).  Patients are able to view lab/test results, encounter notes, upcoming appointments, etc.  Non-urgent messages can be sent to your provider as well.   To learn more about what you can do with MyChart, go to [email or web address].   Other Instructions

## 2024-07-29 LAB — BASIC METABOLIC PANEL WITH GFR
BUN/Creatinine Ratio: 13 (ref 10–24)
BUN: 27 mg/dL (ref 8–27)
CO2: 22 mmol/L (ref 20–29)
Calcium: 9.7 mg/dL (ref 8.6–10.2)
Chloride: 100 mmol/L (ref 96–106)
Creatinine, Ser: 2.07 mg/dL — ABNORMAL HIGH (ref 0.76–1.27)
Glucose: 91 mg/dL (ref 70–99)
Potassium: 4.6 mmol/L (ref 3.5–5.2)
Sodium: 137 mmol/L (ref 134–144)
eGFR: 34 mL/min/1.73 — ABNORMAL LOW (ref >59)

## 2024-07-29 LAB — CBC
Hematocrit: 40.5 % (ref 37.5–51.0)
Hemoglobin: 13.2 g/dL (ref 13.0–17.7)
MCH: 28.8 pg (ref 26.6–33.0)
MCHC: 32.6 g/dL (ref 31.5–35.7)
MCV: 88 fL (ref 79–97)
Platelets: 237 x10E3/uL (ref 150–450)
RBC: 4.59 x10E6/uL (ref 4.14–5.80)
RDW: 12.4 % (ref 11.6–15.4)
WBC: 6 x10E3/uL (ref 3.4–10.8)

## 2024-07-31 ENCOUNTER — Ambulatory Visit: Payer: Self-pay | Admitting: Emergency Medicine

## 2024-08-02 ENCOUNTER — Other Ambulatory Visit: Payer: Self-pay | Admitting: Emergency Medicine

## 2024-08-02 ENCOUNTER — Telehealth (HOSPITAL_COMMUNITY): Payer: Self-pay | Admitting: *Deleted

## 2024-08-02 DIAGNOSIS — R072 Precordial pain: Secondary | ICD-10-CM

## 2024-08-02 NOTE — Telephone Encounter (Signed)
 Left message on voicemail per DPR in reference to upcoming appointment scheduled on 08/04/2024 at 7:45 with detailed instructions given per Myocardial Perfusion Study Information Sheet for the test. LM to arrive 15 minutes early, and that it is imperative to arrive on time for appointment to keep from having the test rescheduled. If you need to cancel or reschedule your appointment, please call the office within 24 hours of your appointment. Failure to do so may result in a cancellation of your appointment, and a $50 no show fee. Phone number given for call back for any questions.

## 2024-08-04 ENCOUNTER — Ambulatory Visit (HOSPITAL_COMMUNITY)
Admission: RE | Admit: 2024-08-04 | Discharge: 2024-08-04 | Disposition: A | Discharge status: Home / Self Care | Source: Ambulatory Visit | Attending: Emergency Medicine | Admitting: Emergency Medicine

## 2024-08-04 DIAGNOSIS — R072 Precordial pain: Secondary | ICD-10-CM | POA: Diagnosis present

## 2024-08-04 LAB — MYOCARDIAL PERFUSION IMAGING
LV dias vol: 132 mL (ref 62–150)
LV sys vol: 65 mL (ref 4.2–5.8)
Nuc Stress EF: 57 %
Peak HR: 70 {beats}/min
Rest HR: 56 {beats}/min
Rest Nuclear Isotope Dose: 10.3 mCi
SDS: 0
SRS: 0
SSS: 0
ST Depression (mm): 0 mm
Stress Nuclear Isotope Dose: 32.4 mCi
TID: 1

## 2024-08-04 MED ORDER — TECHNETIUM TC 99M TETROFOSMIN IV KIT
10.3000 | PACK | Freq: Once | INTRAVENOUS | Status: AC | PRN
  Administered 2024-08-04: 10.3 via INTRAVENOUS

## 2024-08-04 MED ORDER — REGADENOSON 0.4 MG/5ML IV SOLN
INTRAVENOUS | Status: AC
  Filled 2024-08-04: qty 5

## 2024-08-04 MED ORDER — TECHNETIUM TC 99M TETROFOSMIN IV KIT
31.9000 | PACK | Freq: Once | INTRAVENOUS | Status: AC | PRN
  Administered 2024-08-04: 31.9 via INTRAVENOUS

## 2024-08-04 MED ORDER — REGADENOSON 0.4 MG/5ML IV SOLN
0.4000 mg | Freq: Once | INTRAVENOUS | Status: AC
  Administered 2024-08-04: 0.4 mg via INTRAVENOUS

## 2024-08-11 ENCOUNTER — Other Ambulatory Visit: Payer: Self-pay | Admitting: Nephrology

## 2024-08-11 DIAGNOSIS — N1832 Chronic kidney disease, stage 3b: Secondary | ICD-10-CM

## 2024-08-24 ENCOUNTER — Other Ambulatory Visit

## 2024-08-25 ENCOUNTER — Ambulatory Visit
Admission: RE | Admit: 2024-08-25 | Discharge: 2024-08-25 | Disposition: A | Source: Ambulatory Visit | Attending: Nephrology | Admitting: Nephrology

## 2024-08-25 DIAGNOSIS — N1832 Chronic kidney disease, stage 3b: Secondary | ICD-10-CM

## 2024-09-04 ENCOUNTER — Ambulatory Visit: Admitting: Podiatry

## 2024-09-04 ENCOUNTER — Encounter: Payer: Self-pay | Admitting: Podiatry

## 2024-09-04 DIAGNOSIS — S90211A Contusion of right great toe with damage to nail, initial encounter: Secondary | ICD-10-CM

## 2024-09-04 DIAGNOSIS — M7751 Other enthesopathy of right foot: Secondary | ICD-10-CM

## 2024-09-04 DIAGNOSIS — L03031 Cellulitis of right toe: Secondary | ICD-10-CM

## 2024-09-04 NOTE — Progress Notes (Signed)
 Subjective:   Patient ID: Carl [name] Mon, male   DOB: 68 y.o.   MRN: [id number]   HPI Patient presents stating he has had redness in his big toe right and irritation history of vascular disease with stent and had an abrasion of the foot with a piece of wood hitting his foot   ROS      Objective:  Physical Exam  Neurovascular status does indicate good circulation at this time with patient found to have redness and irritation of the right hallux lateral side and does have inflammation and swelling around the inner phalangeal joint     Assessment:  Contusion of the right big toe at the inner phalangeal joint with trauma and what appears to be paronychia infection right hallux lateral border the patient currently on cephalexin  from family physician     Plan:  H&P reviewed and I do think that the contusion will heal uneventfully and the swelling should go down and today I anesthetized the right big toe using sterile instrumentation I removed the lateral border removed all proud flesh flushed it out applied sterile dressing instructed on soaks continue antibiotics and patient will be seen back to recheck

## 2024-09-04 NOTE — Patient Instructions (Signed)

## 2024-09-08 ENCOUNTER — Other Ambulatory Visit: Payer: Self-pay | Admitting: Nurse Practitioner

## 2024-09-11 ENCOUNTER — Ambulatory Visit: Admitting: Emergency Medicine

## 2024-09-15 ENCOUNTER — Ambulatory Visit: Attending: Emergency Medicine | Admitting: Emergency Medicine

## 2024-09-15 ENCOUNTER — Encounter: Payer: Self-pay | Admitting: Emergency Medicine

## 2024-09-15 VITALS — BP 112/68 | HR 66 | Ht 69.0 in | Wt 229.0 lb

## 2024-09-15 DIAGNOSIS — I340 Nonrheumatic mitral (valve) insufficiency: Secondary | ICD-10-CM

## 2024-09-15 DIAGNOSIS — Z79899 Other long term (current) drug therapy: Secondary | ICD-10-CM

## 2024-09-15 DIAGNOSIS — Z951 Presence of aortocoronary bypass graft: Secondary | ICD-10-CM | POA: Diagnosis not present

## 2024-09-15 DIAGNOSIS — E785 Hyperlipidemia, unspecified: Secondary | ICD-10-CM | POA: Diagnosis not present

## 2024-09-15 DIAGNOSIS — R001 Bradycardia, unspecified: Secondary | ICD-10-CM

## 2024-09-15 DIAGNOSIS — N1832 Chronic kidney disease, stage 3b: Secondary | ICD-10-CM | POA: Diagnosis not present

## 2024-09-15 DIAGNOSIS — I10 Essential (primary) hypertension: Secondary | ICD-10-CM | POA: Diagnosis not present

## 2024-09-15 DIAGNOSIS — I2510 Atherosclerotic heart disease of native coronary artery without angina pectoris: Secondary | ICD-10-CM

## 2024-09-15 DIAGNOSIS — I739 Peripheral vascular disease, unspecified: Secondary | ICD-10-CM

## 2024-09-15 LAB — BASIC METABOLIC PANEL WITH GFR
BUN/Creatinine Ratio: 16 (ref 10–24)
BUN: 33 mg/dL — ABNORMAL HIGH (ref 8–27)
CO2: 19 mmol/L — ABNORMAL LOW (ref 20–29)
Calcium: 9.8 mg/dL (ref 8.6–10.2)
Chloride: 99 mmol/L (ref 96–106)
Creatinine, Ser: 2.02 mg/dL — ABNORMAL HIGH (ref 0.76–1.27)
Glucose: 98 mg/dL (ref 70–99)
Potassium: 4.7 mmol/L (ref 3.5–5.2)
Sodium: 136 mmol/L (ref 134–144)
eGFR: 35 mL/min/1.73 — ABNORMAL LOW

## 2024-09-15 NOTE — Progress Notes (Signed)
 " Cardiology Office Note:    Date:  09/15/2024  ID:  Carl [name], DOB 1956-06-28, MRN [id number] PCP: Lenon Boyer, FNP  [facility] Providers Cardiologist:  Dorn Lesches, MD Cardiology APP:  Rana Lum CROME, NP       Patient Profile:       Chief Complaint: 1 month follow-up History of Present Illness:  Carl [name] is a 68 y.o. male with visit-pertinent history of coronary artery disease s/p CABG x 3 on 07/2022 (LIMA to LAD, vein to diagonal branch and PDA), hypertension, hyperlipidemia, tobacco abuse, PAD s/p iliofemoral enterectomy and with patch and angioplasty, CVA, hypothyroidism, mild dilation of aorta   He has history of CAD with initial catheterization performed in 2011 showing 40-50% to mid distal RCA with mild irregularities and vasospasm.  He was seen in follow-up on 06/2022 with complaints of chest pain underwent stress testing that was low risk.  He suffered an NSTEMI on 07/2022 with LHC performed and subsequently underwent CABG x3 on 07/30/22 with free LIMA to LAD, SVG to diagonal, and SVG to PDA.  He did experience rate controlled episodes of atrial fibrillation.  Due to underlying lung disease amiodarone  was not initiated.  Was placed on Plavix  for NSTEMI.  He was referred to Dr. Lesches on 09/04/2022 for evaluation of PAD.  He completed an abdominal aortogram and was evaluated by Dr. Gretta with subsequent left iliofemoral endarterectomy with patch and angioplasty completed on 03/12/2023.  He underwent a left SFA intervention on 09/16/2023 that was unsuccessful due to inability to cross the mid left SFA due to a CTO.  He returned on 10/20/2023 and underwent successful crossing the CTO of the stent on 10/20/2023 by Dr. Darron.  He was then seen by his PCP on 05/26/2024 and noted to have bradycardia with a heart rate of 46.   Seen in clinic on 06/02/2024 with complaints of bradycardia.  His amiodarone  was discontinued.  He was maintaining NSR.  He was continued on  metoprolol  12.5 mg twice daily.   He called in on 06/12/2024 reporting blood pressures remain elevated.  His valsartan  was increased to 320 mg daily.   Seen in clinic on 06/30/2024.  Reports he consistently experiences high blood pressure readings at home.  Blood pressure is 162/84.  His metoprolol  tartrate was discontinued he was started on carvedilol  3.125 mg twice daily and amlodipine  5 mg daily.  Echocardiogram was ordered and completed on 07/13/2024 showing LVEF 55 to 60%, no RWMA, mild LVH, indeterminate diastolic parameters, RV function and size normal, mild mitral valve regurgitation.  Last seen in clinic 07/28/2024.  He presented with intermittent episodes of chest pains.  These episodes were typically short and resolve quickly.  He reported a severe episode of chest discomfort awaking him up from sleep.  He was having to take his nitroglycerin  due to his pain.  His blood pressure was elevated at 152/88.  He was started on isosorbide  30 mg daily and his amlodipine  was increased to 10 mg daily.  He underwent Lexiscan  Myoview  on 08/04/2024 that was of low risk normal study without evidence of ischemia or infarction.  Discussed the use of AI scribe software for clinical note transcription with the patient, who gave verbal consent to proceed.  History of Present Illness Carl [name] is a 68 year old male with hypertension and coronary artery disease who presents for follow-up of his blood pressure and coronary artery disease.  Today patient is doing well without acute cardiovascular concerns  or complaints.  Reports his chest pains have entirely resolved after starting on isosorbide .  He also reports after starting isosorbide  and increasing amlodipine  that he had seen vast improvement in his home blood pressure readings.  Tells me today that he just feels much better overall.  He does continue to report stable claudication symptoms without acute changes.  He does have chronic kidney disease and  has recently established with [facility].  He quit smoking two years ago after open heart surgery. 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 He takes his cardiac medications daily without missing doses.  He denies any exertional symptoms while cutting or moving wood.  He denies headaches, dizziness, lightheadedness, palpitations, or recent syncope.   Review of systems:  Please see the history of present illness. All other systems are reviewed and otherwise negative.      Studies Reviewed:        Lexiscan  Myoview  08/04/2024   The study is normal. The study is low risk.   No ST deviation was noted.   LV perfusion is normal. There is no evidence of ischemia. There is no evidence of infarction.   Left ventricular function is normal. Nuclear stress EF: 57%. The left ventricular ejection fraction is normal (55-65%). End diastolic cavity size is mildly enlarged. End systolic cavity size is normal. No evidence of transient ischemic dilation (TID) noted.   CT images were obtained for attenuation correction and were examined for the presence of coronary calcium  when appropriate.   Coronary calcium  assessment not performed due to prior revascularization.   Prior study available for comparison from 07/01/2022. No changes compared to prior study.  Cardiac catheterization 07/28/2022   Prox RCA to Mid RCA lesion is 65% stenosed.   Mid RCA lesion is 90% stenosed.  Irregular, eccentric calcified   Dist RCA lesion is 95% stenosed. ->  Almost channel due to eccentric location   Mid LAD-1 lesion is 90% stenosed with 30% stenosed side branch in 1st Diag.  Heavily calcified   Mid LAD-2 lesion is 80% stenosed.  Heavily calcified   Dist LAD lesion is 90% stenosed.  Heavily calcified   1st Diag lesion is 70% stenosed.   ----------------------------------   LV end diastolic pressure is mildly elevated.   There is no aortic valve stenosis.   POST-OPERATIVE DIAGNOSIS:    Severe heavily calcified two-vessel CAD:  LAD 90% eccentric/irregular stenosis at major D1 branch that has diffuse disease as well.  This lesion is then followed by tandem 80% and 90% eccentric calcified lesions.  Relatively long segment of diffuse disease heavily calcified; downstream LAD beyond this lesion is relatively free of disease. Highly calcified RCA with mid segment smooth heavily calcified eccentric 60 to 70% stenosis followed by a 90% irregular almost ulcerated calcified plaque just at the been followed by a 99% very eccentric lesion just prior to the bifurcation into the PDA and PL.  The downstream vessels are relatively free of disease. Relatively normal LCx with a major lateral OM 1, relatively small OM 2 and small LPL 1. Mildly elevated LVEDP; normal EF by Echo earlier this month. Diagnostic Dominance: Right   Echocardiogram 07/10/2022  1. Left ventricular ejection fraction, by estimation, is 60 to 65%. The  left ventricle has normal function. The left ventricle has no regional  wall motion abnormalities. Left ventricular diastolic parameters are  consistent with Grade I diastolic  dysfunction (impaired relaxation).   2. Right ventricular systolic function is normal. The  right ventricular  size is normal.   3. The mitral valve is normal in structure. Trivial mitral valve  regurgitation. No evidence of mitral stenosis.   4. The aortic valve is tricuspid. Aortic valve regurgitation is not  visualized. Aortic valve sclerosis is present, with no evidence of aortic  valve stenosis.   5. Aortic dilatation noted. There is mild dilatation of the aortic root,  measuring 40 mm. There is borderline dilatation of the ascending aorta,  measuring 39 mm.   6. The inferior vena cava is normal in size with greater than 50%  respiratory variability, suggesting right atrial pressure of 3 mmHg.   Risk Assessment/Calculations:              Physical Exam:   VS:  BP 112/68 (BP Location:  Right Arm, Patient Position: Sitting, Cuff Size: Normal)   Pulse 66   Ht 5' 9 (1.753 m)   Wt 229 lb (103.9 kg)   BMI 33.82 kg/m    Wt Readings from Last 3 Encounters:  09/15/24 229 lb (103.9 kg)  08/04/24 226 lb (102.5 kg)  07/28/24 226 lb (102.5 kg)    GEN: Well nourished, well developed in no acute distress NECK: No JVD; No carotid bruits CARDIAC: RRR, no murmurs, rubs, gallops RESPIRATORY:  Clear to auscultation without rales, wheezing or rhonchi  ABDOMEN: Soft, non-tender, non-distended EXTREMITIES:  No edema; No acute deformity      Assessment and Plan:  Coronary artery disease S/p CABG x 3 in 2023 Echocardiogram 06/2024 with LVEF 55 to 60% and no RWMA Lexiscan  Myoview  07/2024 was without ischemia or infarction and low risk study - Today he reports chest pains have resolved since starting isosorbide  and improvement in his blood pressure - He is without symptoms concerning for active angina.  Denies exertional chest discomfort.  No indication of further ischemic evaluation at this time - Continue aspirin  81 mg daily and clopidogrel  75 mg daily - Continue rosuvastatin  40 mg daily, ezetimibe  10 mg daily, and fenofibrate  145 mg - Continue carvedilol  3.125 mg twice daily, amlodipine  10 mg daily, and isosorbide  30 mg daily - BMET today   Hypertension Blood pressure today is well-controlled at 112/68 Reports home blood pressure readings WNL Echocardiogram 06/2024 showed mild LVH - Unable to increase beta-blocker given baseline bradycardia - Continue isosorbide  30 mg daily, amlodipine  10 mg daily, valsartan  320 mg daily, and carvedilol  3.125 mg twice daily   Bradycardia Amiodarone  previously discontinued in the setting of bradycardia Heart rate at home averages 55-65 range - Heart rate today is 66 bpm - He is without symptoms of symptomatic bradycardia - Continue to clinically monitor   Peripheral arterial disease S/p left SFA angioplasty with stenting on 10/20/2023 Most  recently on 02/21/2024 underwent right common femoral endarterectomy with bovine pericardial patch angioplasty, RLE angiogram with angioplasty and stenting of the right proximal and also distal SFA and above knee popliteal artery by Dr. Gretta.  - Today he reports stable claudication symptoms - Management per [facility]   Hyperlipidemia, LDL goal <55 LDL 39 and TG 131 on 02/2024 and well-controlled - Continue rosuvastatin  40 mg daily, ezetimibe  10 mg daily, and fenofibrate  145 mg daily   Dilation of aorta Echocardiogram 06/2022 with mild dilation of the aortic root measuring 40 mm and borderline dilation of ascending aorta measuring 39 mm Repeat echocardiogram on 06/2024 showed aortic root is normal in size and structure - No further imaging warranted at this time   Postop A-fib Experienced rate controlled episodes  postop CABG without reoccurrence Amiodarone  discontinued due to bradycardia on 06/02/2024 - Maintaining sinus rhythm on auscultation today - He denies any symptoms concerning for recurrent atrial fibrillation.  No palpitations   CKD stage IIIb Creatinine 1.7 on 11/23 - He has established care with [facility] - BMET today   Mild mitral valve regurgitation Echocardiogram 06/2024 showed mild mitral valve regurgitation - No interventions warranted at this time       Dispo:  Return in about 6 months (around 03/16/2025).  Signed, Lum LITTIE Louis, NP  "

## 2024-09-15 NOTE — Patient Instructions (Signed)
 Medication Instructions:  NO CHANGES  Lab Work: BMET TO BE DONE TODAY.  Testing/Procedures: NONE  Follow-Up: At [facility], you and your health needs are our priority.  As part of our continuing mission to provide you with exceptional heart care, our providers are all part of one team.  This team includes your primary Cardiologist (physician) and Advanced Practice Providers or APPs (Physician Assistants and Nurse Practitioners) who all work together to provide you with the care you need, when you need it.  Your next appointment:   6 MONTHS  Provider:   Dorn Lesches, MD OR Lum Louis, NP

## 2024-09-19 ENCOUNTER — Ambulatory Visit: Payer: Self-pay | Admitting: Emergency Medicine

## 2024-10-05 ENCOUNTER — Telehealth (HOSPITAL_BASED_OUTPATIENT_CLINIC_OR_DEPARTMENT_OTHER): Payer: Self-pay | Admitting: *Deleted

## 2024-10-05 NOTE — Telephone Encounter (Signed)
"  ° °  Patient Name: Carl [name]  DOB: 1955-11-04 MRN: [id number]  Primary Cardiologist: Dorn Lesches, MD  Chart reviewed as part of pre-operative protocol coverage. Given past medical history and time since last visit, based on ACC/AHA guidelines, Carl [name] is at acceptable risk for the planned procedure without further cardiovascular testing.   He may hold Plavix  for 5 days prior to procedure. Please resume Plavix  as soon as possible postprocedure, at the discretion of the surgeon.    Regarding ASA therapy, we recommend continuation of ASA throughout the perioperative period.    SBE prophylaxis is not required for the patient.  I will route this recommendation to the requesting party via Epic fax function and remove from pre-op pool.  Please call with questions.  Lum LITTIE Louis, NP 10/05/2024, 1:36 PM  "

## 2024-10-05 NOTE — Telephone Encounter (Signed)
"  ° °  Pre-operative Risk Assessment    Patient Name: Carl [name]  DOB: July 28, 1956 MRN: [id number]   Date of last office visit: 09/15/24 LUM LOUIS, NP Date of next office visit: NONE  Request for Surgical Clearance    Procedure:  Dental Extraction - Amount of Teeth to be Pulled:  THE 1ST Tx PLAN WILL BE 6 SURGICAL EXTRACTIONS WITH MANDIBULAR EXOSTOSES ; THE PT WILL THEN IN THE 2ND Tx PLAN IN THE NEAR FUTURE WILL BE HAVING 4 SURGICALLY EXTRACTED TEETH; ( I DID STATE WHEN READY FOR THE 2ND Tx PLAN TO PLEASE SEND NEW REQUEST DUE TO PT IS ON BLOOD THINNERS)  Date of Surgery:  Clearance TBD                                Surgeon:  DR. ELSPETH ERPS, DDS Surgeon's Group or Practice Name:  [facility] Phone number:  [phone] Fax number:  [phone]   Type of Clearance Requested:   - Medical  - Pharmacy:  Hold Aspirin  and Clopidogrel  (Plavix )     Type of Anesthesia:  Local    Additional requests/questions:    Bonney Niels Jest   10/05/2024, 10:18 AM   "

## 2024-10-22 ENCOUNTER — Other Ambulatory Visit: Payer: Self-pay | Admitting: Emergency Medicine

## 2024-10-25 ENCOUNTER — Ambulatory Visit: Admitting: Emergency Medicine

## 2024-10-26 ENCOUNTER — Other Ambulatory Visit: Payer: Self-pay | Admitting: Emergency Medicine

## 2024-10-31 ENCOUNTER — Ambulatory Visit (HOSPITAL_COMMUNITY)

## 2024-10-31 ENCOUNTER — Ambulatory Visit

## 2025-01-17 ENCOUNTER — Ambulatory Visit (HOSPITAL_COMMUNITY)

## 2025-01-17 ENCOUNTER — Ambulatory Visit
